# Patient Record
Sex: Male | Born: 1960 | Race: White | Hispanic: No | Marital: Married | Smoking: Current every day smoker
Health system: Southern US, Community
[De-identification: ages and names within clinical notes are randomized; demographics above are authoritative.]

## PROBLEM LIST (undated history)

## (undated) DIAGNOSIS — I10 Essential (primary) hypertension: Secondary | ICD-10-CM

## (undated) DIAGNOSIS — I2510 Atherosclerotic heart disease of native coronary artery without angina pectoris: Secondary | ICD-10-CM

## (undated) DIAGNOSIS — Z8673 Personal history of transient ischemic attack (TIA), and cerebral infarction without residual deficits: Secondary | ICD-10-CM

## (undated) DIAGNOSIS — Z720 Tobacco use: Secondary | ICD-10-CM

## (undated) DIAGNOSIS — Z8719 Personal history of other diseases of the digestive system: Secondary | ICD-10-CM

## (undated) DIAGNOSIS — G629 Polyneuropathy, unspecified: Secondary | ICD-10-CM

## (undated) DIAGNOSIS — K219 Gastro-esophageal reflux disease without esophagitis: Secondary | ICD-10-CM

## (undated) DIAGNOSIS — E119 Type 2 diabetes mellitus without complications: Secondary | ICD-10-CM

## (undated) DIAGNOSIS — C679 Malignant neoplasm of bladder, unspecified: Secondary | ICD-10-CM

## (undated) DIAGNOSIS — I639 Cerebral infarction, unspecified: Secondary | ICD-10-CM

## (undated) DIAGNOSIS — E785 Hyperlipidemia, unspecified: Secondary | ICD-10-CM

## (undated) DIAGNOSIS — M1990 Unspecified osteoarthritis, unspecified site: Secondary | ICD-10-CM

## (undated) DIAGNOSIS — H539 Unspecified visual disturbance: Secondary | ICD-10-CM

## (undated) DIAGNOSIS — I219 Acute myocardial infarction, unspecified: Secondary | ICD-10-CM

## (undated) HISTORY — DX: Cerebral infarction, unspecified: I63.9

## (undated) HISTORY — DX: Type 2 diabetes mellitus without complications: E11.9

## (undated) HISTORY — PX: BLADDER TUMOR EXCISION: SHX238

## (undated) HISTORY — DX: Essential (primary) hypertension: I10

## (undated) HISTORY — DX: Gastro-esophageal reflux disease without esophagitis: K21.9

## (undated) HISTORY — DX: Hyperlipidemia, unspecified: E78.5

## (undated) HISTORY — PX: CORONARY ANGIOPLASTY WITH STENT PLACEMENT: SHX49

## (undated) HISTORY — DX: Acute myocardial infarction, unspecified: I21.9

## (undated) HISTORY — PX: CAROTID ENDARTERECTOMY: SUR193

## (undated) HISTORY — PX: HERNIA REPAIR: SHX51

## (undated) HISTORY — DX: Personal history of transient ischemic attack (TIA), and cerebral infarction without residual deficits: Z86.73

## (undated) HISTORY — DX: Unspecified osteoarthritis, unspecified site: M19.90

---
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
## 2002-01-02 ENCOUNTER — Emergency Department (HOSPITAL_COMMUNITY): Admission: EM | Admit: 2002-01-02 | Discharge: 2002-01-02 | Payer: Self-pay | Admitting: Emergency Medicine

## 2010-05-23 ENCOUNTER — Emergency Department (HOSPITAL_COMMUNITY): Admission: EM | Admit: 2010-05-23 | Payer: Self-pay | Source: Home / Self Care

## 2018-08-05 ENCOUNTER — Telehealth: Payer: Self-pay

## 2018-08-05 NOTE — Telephone Encounter (Signed)
Copied from [place] [id number]. Topic: Appointment Scheduling - Scheduling Inquiry for Clinic >> Aug 05, 2018  9:16 AM Erick Blinks wrote: Reason for CRM: Pt's sister Everlean Patterson director of [facility] is requesting for pt to be seen with Dr. Charlett Blake to establish care. Pt has some concerns that current PCP is not addressing all needs. Please advise  Best contact: [phone] work cell (angela white)    Please advise

## 2018-08-05 NOTE — Telephone Encounter (Signed)
Sure am happy to see him. Set up virtual or in person

## 2018-08-05 NOTE — Telephone Encounter (Signed)
See previous note

## 2018-08-16 ENCOUNTER — Encounter: Payer: Self-pay | Admitting: Family Medicine

## 2018-08-16 ENCOUNTER — Ambulatory Visit (INDEPENDENT_AMBULATORY_CARE_PROVIDER_SITE_OTHER): Payer: Medicare Other | Admitting: Family Medicine

## 2018-08-16 ENCOUNTER — Other Ambulatory Visit: Payer: Self-pay

## 2018-08-16 VITALS — BP 180/104 | Ht 63.0 in | Wt 150.0 lb

## 2018-08-16 DIAGNOSIS — K219 Gastro-esophageal reflux disease without esophagitis: Secondary | ICD-10-CM

## 2018-08-16 DIAGNOSIS — E119 Type 2 diabetes mellitus without complications: Secondary | ICD-10-CM | POA: Diagnosis not present

## 2018-08-16 DIAGNOSIS — R079 Chest pain, unspecified: Secondary | ICD-10-CM

## 2018-08-16 DIAGNOSIS — H539 Unspecified visual disturbance: Secondary | ICD-10-CM

## 2018-08-16 DIAGNOSIS — H43391 Other vitreous opacities, right eye: Secondary | ICD-10-CM | POA: Diagnosis not present

## 2018-08-16 DIAGNOSIS — C679 Malignant neoplasm of bladder, unspecified: Secondary | ICD-10-CM

## 2018-08-16 DIAGNOSIS — G8929 Other chronic pain: Secondary | ICD-10-CM

## 2018-08-16 DIAGNOSIS — E782 Mixed hyperlipidemia: Secondary | ICD-10-CM | POA: Insufficient documentation

## 2018-08-16 DIAGNOSIS — Q898 Other specified congenital malformations: Secondary | ICD-10-CM

## 2018-08-16 DIAGNOSIS — Z720 Tobacco use: Secondary | ICD-10-CM

## 2018-08-16 DIAGNOSIS — I10 Essential (primary) hypertension: Secondary | ICD-10-CM | POA: Insufficient documentation

## 2018-08-16 DIAGNOSIS — M545 Low back pain, unspecified: Secondary | ICD-10-CM

## 2018-08-16 DIAGNOSIS — I2510 Atherosclerotic heart disease of native coronary artery without angina pectoris: Secondary | ICD-10-CM

## 2018-08-16 DIAGNOSIS — R569 Unspecified convulsions: Secondary | ICD-10-CM

## 2018-08-16 DIAGNOSIS — Q8989 Other specified congenital malformations: Secondary | ICD-10-CM

## 2018-08-16 DIAGNOSIS — E785 Hyperlipidemia, unspecified: Secondary | ICD-10-CM | POA: Diagnosis not present

## 2018-08-16 DIAGNOSIS — G6289 Other specified polyneuropathies: Secondary | ICD-10-CM

## 2018-08-16 DIAGNOSIS — Z8673 Personal history of transient ischemic attack (TIA), and cerebral infarction without residual deficits: Secondary | ICD-10-CM

## 2018-08-16 DIAGNOSIS — Z8719 Personal history of other diseases of the digestive system: Secondary | ICD-10-CM

## 2018-08-16 DIAGNOSIS — I159 Secondary hypertension, unspecified: Secondary | ICD-10-CM

## 2018-08-16 MED ORDER — GABAPENTIN 100 MG PO CAPS: 100.0000 mg | ORAL_CAPSULE | Freq: Every day | ORAL | 3 refills | Status: DC

## 2018-08-16 MED ORDER — ASPIRIN EC 81 MG PO TBEC: 81.0000 mg | DELAYED_RELEASE_TABLET | Freq: Every day | ORAL | Status: AC

## 2018-08-16 MED ORDER — NITROGLYCERIN 0.4 MG SL SUBL: 0.4000 mg | SUBLINGUAL_TABLET | SUBLINGUAL | 3 refills | Status: DC | PRN

## 2018-08-16 MED ORDER — CARVEDILOL 3.125 MG PO TABS: 3.1250 mg | ORAL_TABLET | Freq: Two times a day (BID) | ORAL | 3 refills | Status: DC

## 2018-08-16 NOTE — Patient Instructions (Signed)
[facility] blood pressure  Preventive Care 38-57 Years Old, Male Preventive care refers to lifestyle choices and visits with your health care provider that can promote health and wellness. This includes:  A yearly physical exam. This is also called an annual well check.  Regular dental and eye exams.  Immunizations.  Screening for certain conditions.  Healthy lifestyle choices, such as eating a healthy diet, getting regular exercise, not using drugs or products that contain nicotine and tobacco, and limiting alcohol use. What can I expect for my preventive care visit? Physical exam Your health care provider will check:  Height and weight. These may be used to calculate body mass index (BMI), which is a measurement that tells if you are at a healthy weight.  Heart rate and blood pressure.  Your skin for abnormal spots. Counseling Your health care provider may ask you questions about:  Alcohol, tobacco, and drug use.  Emotional well-being.  Home and relationship well-being.  Sexual activity.  Eating habits.  Work and work Statistician. What immunizations do I need?  Influenza (flu) vaccine  This is recommended every year. Tetanus, diphtheria, and pertussis (Tdap) vaccine  You may need a Td booster every 10 years. Varicella (chickenpox) vaccine  You may need this vaccine if you have not already been vaccinated. Zoster (shingles) vaccine  You may need this after age 80. Measles, mumps, and rubella (MMR) vaccine  You may need at least one dose of MMR if you were born in 1957 or later. You may also need a second dose. Pneumococcal conjugate (PCV13) vaccine  You may need this if you have certain conditions and were not previously vaccinated. Pneumococcal polysaccharide (PPSV23) vaccine  You may need one or two doses if you smoke cigarettes or if you have certain conditions. Meningococcal conjugate (MenACWY) vaccine  You may need this if you have certain conditions.  Hepatitis A vaccine  You may need this if you have certain conditions or if you travel or work in places where you may be exposed to hepatitis A. Hepatitis B vaccine  You may need this if you have certain conditions or if you travel or work in places where you may be exposed to hepatitis B. Haemophilus influenzae type b (Hib) vaccine  You may need this if you have certain risk factors. Human papillomavirus (HPV) vaccine  If recommended by your health care provider, you may need three doses over 6 months. You may receive vaccines as individual doses or as more than one vaccine together in one shot (combination vaccines). Talk with your health care provider about the risks and benefits of combination vaccines. What tests do I need? Blood tests  Lipid and cholesterol levels. These may be checked every 5 years, or more frequently if you are over 88 years old.  Hepatitis C test.  Hepatitis B test. Screening  Lung cancer screening. You may have this screening every year starting at age 65 if you have a 30-pack-year history of smoking and currently smoke or have quit within the past 15 years.  Prostate cancer screening. Recommendations will vary depending on your family history and other risks.  Colorectal cancer screening. All adults should have this screening starting at age 68 and continuing until age 59. Your health care provider may recommend screening at age 27 if you are at increased risk. You will have tests every 1-10 years, depending on your results and the type of screening test.  Diabetes screening. This is done by checking your blood sugar (glucose) after  you have not eaten for a while (fasting). You may have this done every 1-3 years.  Sexually transmitted disease (STD) testing. Follow these instructions at home: Eating and drinking  Eat a diet that includes fresh fruits and vegetables, whole grains, lean protein, and low-fat dairy products.  Take vitamin and mineral  supplements as recommended by your health care provider.  Do not drink alcohol if your health care provider tells you not to drink.  If you drink alcohol: ? Limit how much you have to 0-2 drinks a day. ? Be aware of how much alcohol is in your drink. In the [place] one drink equals one 12 oz bottle of beer (355 mL), one 5 oz glass of wine (148 mL), or one 1 oz glass of hard liquor (44 mL). Lifestyle  Take daily care of your teeth and gums.  Stay active. Exercise for at least 30 minutes on 5 or more days each week.  Do not use any products that contain nicotine or tobacco, such as cigarettes, e-cigarettes, and chewing tobacco. If you need help quitting, ask your health care provider.  If you are sexually active, practice safe sex. Use a condom or other form of protection to prevent STIs (sexually transmitted infections).  Talk with your health care provider about taking a low-dose aspirin every day starting at age 3. What's next?  Go to your health care provider once a year for a well check visit.  Ask your health care provider how often you should have your eyes and teeth checked.  Stay up to date on all vaccines. This information is not intended to replace advice given to you by your health care provider. Make sure you discuss any questions you have with your health care provider. Document Released: 01/18/2015 Document Revised: 12/16/2017 Document Reviewed: 12/16/2017 Elsevier Patient Education  2020 [employer].

## 2018-08-16 NOTE — Progress Notes (Signed)
-  Both feet numb and hurt.....going on about 6 months -Blood pressure concerns  -Skin from hips down, hurts for him to touch his legs  -Blood sugar out of control

## 2018-08-17 ENCOUNTER — Encounter: Payer: Self-pay | Admitting: Family Medicine

## 2018-08-17 DIAGNOSIS — Z720 Tobacco use: Secondary | ICD-10-CM

## 2018-08-17 DIAGNOSIS — H539 Unspecified visual disturbance: Secondary | ICD-10-CM

## 2018-08-17 DIAGNOSIS — Q898 Other specified congenital malformations: Secondary | ICD-10-CM | POA: Insufficient documentation

## 2018-08-17 DIAGNOSIS — I25119 Atherosclerotic heart disease of native coronary artery with unspecified angina pectoris: Secondary | ICD-10-CM | POA: Insufficient documentation

## 2018-08-17 DIAGNOSIS — G629 Polyneuropathy, unspecified: Secondary | ICD-10-CM

## 2018-08-17 DIAGNOSIS — C679 Malignant neoplasm of bladder, unspecified: Secondary | ICD-10-CM | POA: Insufficient documentation

## 2018-08-17 DIAGNOSIS — I2510 Atherosclerotic heart disease of native coronary artery without angina pectoris: Secondary | ICD-10-CM

## 2018-08-17 DIAGNOSIS — M549 Dorsalgia, unspecified: Secondary | ICD-10-CM | POA: Insufficient documentation

## 2018-08-17 DIAGNOSIS — R569 Unspecified convulsions: Secondary | ICD-10-CM | POA: Insufficient documentation

## 2018-08-17 DIAGNOSIS — R079 Chest pain, unspecified: Secondary | ICD-10-CM | POA: Insufficient documentation

## 2018-08-17 DIAGNOSIS — Z8719 Personal history of other diseases of the digestive system: Secondary | ICD-10-CM

## 2018-08-17 HISTORY — DX: Malignant neoplasm of bladder, unspecified: C67.9

## 2018-08-17 HISTORY — DX: Tobacco use: Z72.0

## 2018-08-17 HISTORY — DX: Unspecified visual disturbance: H53.9

## 2018-08-17 HISTORY — DX: Polyneuropathy, unspecified: G62.9

## 2018-08-17 HISTORY — DX: Personal history of other diseases of the digestive system: Z87.19

## 2018-08-17 HISTORY — DX: Atherosclerotic heart disease of native coronary artery without angina pectoris: I25.10

## 2018-08-17 NOTE — Assessment & Plan Note (Signed)
Encouraged heart healthy diet, increase exercise, avoid trans fats, consider a krill oil cap daily 

## 2018-08-17 NOTE — Assessment & Plan Note (Signed)
Burning and numbness present in both feet/legs for years but patient getting more uncomfortable. Will start Gabapentin 100 mg qhs and increase to 300 mg qhs. Reassess at next visit.

## 2018-08-17 NOTE — Assessment & Plan Note (Signed)
At the end of an hour long visit he mentions he has been having chest pain intermittently for a couple of weeks. The pain is substernal and sharp, radiates to his jaw ans lasts 5-10 minutes. Given his history and risk factor he is referred to cardiology for further evaluation. He is to start aspirin 81 mg daily and is given SL NTG 0.4 mg to use as directed if CP occurs. If no resolution after 3 doses to ER.

## 2018-08-17 NOTE — Assessment & Plan Note (Signed)
No seizure since 58 years of age.

## 2018-08-17 NOTE — Assessment & Plan Note (Signed)
Avoid offending foods, start probiotics. Do not eat large meals in late evening and consider raising head of bed. Well controlled on Nexium 20 mg bid

## 2018-08-17 NOTE — Assessment & Plan Note (Signed)
Will check hgba1c. minimize simple carbs. Increase exercise as tolerated. Continue current meds which he has only recently restarted

## 2018-08-17 NOTE — Assessment & Plan Note (Signed)
Encouraged complete cessation. Discussed need to quit as relates to risk of numerous cancers, cardiac and pulmonary disease as well as neurologic complications. Counseled for greater than 3 minutes 

## 2018-08-17 NOTE — Assessment & Plan Note (Signed)
H/o thrombosed hemorrhoids requiring surgical correction. He is hesitant to accept referral to colonoscopy for this reason. He is worried it would cause a resurgence of hemorrhoids.

## 2018-08-17 NOTE — Assessment & Plan Note (Signed)
Has not been left with deficits.

## 2018-08-17 NOTE — Assessment & Plan Note (Signed)
Encouraged moist heat and gentle stretching as tolerated. May try NSAIDs and prescription meds as directed and report if symptoms worsen or seek immediate care 

## 2018-08-17 NOTE — Assessment & Plan Note (Signed)
He is reporting flashes of light in his left eye intermittently. Is referred to opthamologyf for further evaluation

## 2018-08-17 NOTE — Assessment & Plan Note (Signed)
He has had labile bp recently. Has stopped th LIsinopril given to him by his previous PMD. Will wait to restart until we see lab work. Start Carvedilol 3.125 mg bid. Check vitals daily and report any concerns.

## 2018-08-17 NOTE — Progress Notes (Signed)
Virtual Visit via Video Note  I connected with Dean Singh on 08/16/2018 at  3:20 PM EDT by a video enabled telemedicine application and verified that I am speaking with the correct person using two identifiers.  Location: Patient: home Provider: office   I discussed the limitations of evaluation and management by telemedicine and the availability of in person appointments. The patient expressed understanding and agreed to proceed. Magdalene Molly, CMA was able to get the patient set up on a visit, video   Subjective:    Patient ID: Dean Singh, male    DOB: [birth year], 58 y.o.   MRN: [id number]  No chief complaint on file.   HPI Patient is in today for new patient appointment with a very complicated past medical history that includes coronary artery disease with 2 stents in place, stroke without lasting deficits, peripheral neuropathy bilaterally, tobacco abuse, h/o alcohol abuse in his teens but abstinence since, bladder cancer, hyperlipidemia, uncontrolled diabetes, reflux and more. He is accompanied by his wife and sister in his home. He feels at his baseline today and fairly well but he endorses many concerns. He reports he chose not to go back to his previous PMD many months ago because he has significant pain daily secondary to peripheral neuropathy in both legs. He reports he has had the pain for years but it has been worsening. He also notes chronic trouble with low back pain and DDD. No recent fall or injury. He struggles with diabetes and had stopped his Metformin after getting frustrated with his PMD and only restarted it a couple of weeks ago. No c/o polyuria or polydipsia. His heartburn is well controlled on Nexium 20 mg bid. He was on Lisinopril but stopped that as well since he stopped seing his PMD. As a child he had seizures but none since 45 years of age. He reports a h/o a stroke but he has not been left with any deficits. He is endorsing episodes of seeing flashes of  bright light in his left eye which comes and goes. No visual changes otherwise and no trauma. He also notes several episodes of chest pain in the past couple of weeks. The episodes happen in no apparent pattern but do not awaken him from sleep. They are sharp and substernal and last less than 10 minutes. The pain can radiate to the jaw and left arm. He experiences palpitations at times but they are not associated with the chest pain. No recent febrile illness or hospitalizations. He has not sought any care for his symptoms to date. Denies SOB/HA/congestion/fevers/GI or GU c/o. Taking meds as prescribed  Past Medical History:  Diagnosis Date   Arthritis    DDD   Bladder cancer ([place])    Bladder cancer () 08/17/2018   CAD in native artery 08/17/2018   Current nicotine use 08/17/2018   Diabetes mellitus without complication (HCC)    GERD (gastroesophageal reflux disease)    H/O: stroke    Heart attack ([place])    2 stents   Hx of hemorrhoids 08/17/2018   Hyperlipidemia    Hypertension    Peripheral neuropathy 08/17/2018   Stroke ([place]) 57   Visual changes 08/17/2018    Family History  Problem Relation Age of Onset   COPD Mother    Diabetes Mother    Dementia Mother    Heart disease Mother        chf   Arthritis Mother    Osteoporosis Mother  h/o cigarette smoking   Heart disease Father        stents 3 x 2, pacer, chf   Parkinson's disease Father    Hyperlipidemia Father    Diabetes Father    Arthritis Father        DDD   Osteoporosis Father        h/o cigarette use   Addison's disease Sister    Diabetes Sister    Hyperlipidemia Sister    Hypertension Sister    Obesity Sister    Hyperlipidemia Brother    Hypertension Brother    Diabetes Brother    Obesity Brother    Rheumatic fever Son    Stickler syndrome Son    Diabetes Maternal Grandmother    Dementia Maternal Grandmother    Heart disease Maternal Grandmother        chf    Hyperlipidemia Maternal Grandmother    Diabetes Maternal Grandfather    Dementia Maternal Grandfather    Heart disease Maternal Grandfather        cad   Hyperlipidemia Maternal Grandfather    Other Paternal Grandmother        scarlet fever   Prostate cancer Paternal Grandfather    Obesity Brother     Social History   Socioeconomic History   Marital status: Married    Spouse name: Not on file   Number of children: Not on file   Years of education: Not on file   Highest education level: Not on file  Occupational History   Not on file  Social Needs   Financial resource strain: Not on file   Food insecurity    Worry: Not on file    Inability: Not on file   Transportation needs    Medical: Not on file    Non-medical: Not on file  Tobacco Use   Smoking status: Current Every Day Smoker   Smokeless tobacco: Current User  Substance and Sexual Activity   Alcohol use: Not Currently   Drug use: Not Currently   Sexual activity: Yes  Lifestyle   Physical activity    Days per week: Not on file    Minutes per session: Not on file   Stress: Not on file  Relationships   Social connections    Talks on phone: Not on file    Gets together: Not on file    Attends religious service: Not on file    Active member of club or organization: Not on file    Attends meetings of clubs or organizations: Not on file    Relationship status: Not on file   Intimate partner violence    Fear of current or ex partner: Not on file    Emotionally abused: Not on file    Physically abused: Not on file    Forced sexual activity: Not on file  Other Topics Concern   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     glucose blood (ONETOUCH ULTRA) test strip USE TWICE DAILY AS DIRECTED ( E11.65)  metFORMIN (GLUCOPHAGE) 1000 MG tablet Take by mouth.     esomeprazole (NEXIUM) 40 MG capsule Take by mouth.     glucose blood (PRECISION QID TEST) test strip Use twice daily as directed     metFORMIN (GLUCOPHAGE) 500 MG tablet Take by mouth.     No facility-administered medications prior to visit.     Allergies  Allergen Reactions   Lipitor [Atorvastatin Calcium] Itching    Patient reports "Made my skin feel horrible and unbearable"   Statins Itching   Morphine Nausea And Vomiting    "felt like chest was exploding" pt reports "felt like chest was exploding" pt reports "felt like chest was exploding" pt reports "felt like chest was exploding" pt reports     Review of Systems  Constitutional: Positive for malaise/fatigue. Negative for chills and fever.  HENT: Negative for congestion and hearing loss.   Eyes: Positive for blurred vision. Negative for double vision, photophobia, pain, discharge and redness.  Respiratory: Positive for shortness of breath. Negative for cough and sputum production.   Cardiovascular: Positive for chest pain. Negative for palpitations and leg swelling.  Gastrointestinal: Positive for heartburn. Negative for abdominal pain, blood in stool, constipation, diarrhea, nausea and vomiting.  Genitourinary: Negative for dysuria, frequency, hematuria and urgency.  Musculoskeletal: Negative for back pain, falls and myalgias.  Skin: Negative for rash.  Neurological: Positive for tingling and sensory change. Negative for dizziness, loss of consciousness, weakness and headaches.  Endo/Heme/Allergies: Negative for environmental allergies. Does not bruise/bleed easily.  Psychiatric/Behavioral: Negative for depression and suicidal ideas.  The patient is not nervous/anxious and does not have insomnia.        Objective:    Physical Exam Constitutional:      Appearance: Normal appearance. He is not ill-appearing.  HENT:     Head: Normocephalic and atraumatic.     Right Ear: External ear normal.     Left Ear: External ear normal.  Eyes:     General:        Right eye: No discharge.        Left eye: No discharge.  Pulmonary:     Effort: Pulmonary effort is normal.  Neurological:     Mental Status: He is alert and oriented to person, place, and time.  Psychiatric:        Mood and Affect: Mood normal.        Behavior: Behavior normal.     BP (!) 180/104 (BP Location: Left Arm, Patient Position: Sitting, Cuff Size: Normal)    Ht '5\' 3"'  (1.6 m)    Wt 150 lb (68 kg)    BMI 26.57 kg/m  Wt Readings from Last 3 Encounters:  08/16/18 150 lb (68 kg)    Diabetic Foot Exam - Simple   No data filed     No results found for: WBC, HGB, HCT, PLT, GLUCOSE, CHOL, TRIG, HDL, LDLDIRECT, LDLCALC, ALT, AST, NA, K, CL, CREATININE, BUN, CO2, TSH, PSA, INR, GLUF, HGBA1C, MICROALBUR  No results found for: TSH No results found for: WBC, HGB, HCT, MCV, PLT No results found for: NA, K, CHLORIDE, CO2, GLUCOSE, BUN, CREATININE, BILITOT, ALKPHOS, AST, ALT, PROT, ALBUMIN, CALCIUM, ANIONGAP, EGFR, GFR No results found for: CHOL No results found for: HDL No results found for: LDLCALC No results found for: TRIG No results found for: CHOLHDL No results found for: HGBA1C     Assessment & Plan:   Problem List Items Addressed This Visit    Hyperlipidemia    Encouraged  heart healthy diet, increase exercise, avoid trans fats, consider a krill oil cap daily      Relevant Medications   carvedilol (COREG) 3.125 MG tablet   nitroGLYCERIN (NITROSTAT) 0.4 MG SL tablet   aspirin EC 81 MG tablet   Diabetes mellitus without complication (HCC)    Will check hgba1c. minimize simple carbs. Increase exercise as tolerated. Continue current meds which  he has only recently restarted      Relevant Medications   glipiZIDE (GLUCOTROL XL) 5 MG 24 hr tablet   metFORMIN (GLUCOPHAGE) 1000 MG tablet   metFORMIN (GLUCOPHAGE) 500 MG tablet   aspirin EC 81 MG tablet   GERD (gastroesophageal reflux disease)    Avoid offending foods, start probiotics. Do not eat large meals in late evening and consider raising head of bed. Well controlled on Nexium 20 mg bid      Relevant Medications   esomeprazole (NEXIUM) 40 MG capsule   H/O: stroke    Has not been left with deficits.      Hypertension    He has had labile bp recently. Has stopped th LIsinopril given to him by his previous PMD. Will wait to restart until we see lab work. Start Carvedilol 3.125 mg bid. Check vitals daily and report any concerns.       Relevant Medications   carvedilol (COREG) 3.125 MG tablet   nitroGLYCERIN (NITROSTAT) 0.4 MG SL tablet   aspirin EC 81 MG tablet   Chest pain    At the end of an hour long visit he mentions he has been having chest pain intermittently for a couple of weeks. The pain is substernal and sharp, radiates to his jaw ans lasts 5-10 minutes. Given his history and risk factor he is referred to cardiology for further evaluation. He is to start aspirin 81 mg daily and is given SL NTG 0.4 mg to use as directed if CP occurs. If no resolution after 3 doses to ER.       Relevant Orders   Ambulatory referral to Cardiology   Bladder cancer [facility])   Relevant Medications   aspirin EC 81 MG tablet   Visual changes    He is reporting flashes of light in his left eye intermittently. Is referred to opthamologyf for further evaluation      Hx of hemorrhoids    H/o thrombosed hemorrhoids requiring surgical correction. He is hesitant to accept referral to colonoscopy for this reason. He is worried it would cause a resurgence of hemorrhoids.       Current nicotine use    Encouraged complete cessation. Discussed need to quit as relates to risk of numerous cancers,  cardiac and pulmonary disease as well as neurologic complications. 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       Relevant Medications   gabapentin (NEURONTIN) 100 MG capsule   CAD in native artery   RESOLVED: Stickler syndrome   Relevant Medications   aspirin EC 81 MG tablet   Peripheral neuropathy    Burning and numbness present in both feet/legs for years but patient getting more uncomfortable. Will start Gabapentin 100 mg qhs and increase to 300 mg qhs. Reassess at next visit.       Relevant Medications   gabapentin (NEURONTIN) 100 MG capsule   Back pain    Encouraged moist heat and gentle stretching as tolerated. May try NSAIDs and prescription  meds as directed and report if symptoms worsen or seek immediate care      Relevant Medications   aspirin EC 81 MG tablet    Other Visit Diagnoses    Floaters, right    -  Primary   Relevant Orders   Ambulatory referral to Ophthalmology      I am having Dean Singh start on gabapentin, carvedilol, nitroGLYCERIN, and aspirin EC. I am also having him maintain his esomeprazole, glipiZIDE, metFORMIN, metFORMIN, OneTouch Ultra, FreeStyle Libre 14 Day Sensor, and Precision QID Test.  Meds ordered this encounter  Medications   gabapentin (NEURONTIN) 100 MG capsule    Sig: Take 1-3 capsules (100-300 mg total) by mouth at bedtime.    Dispense:  90 capsule    Refill:  3   carvedilol (COREG) 3.125 MG tablet    Sig: Take 1 tablet (3.125 mg total) by mouth 2 (two) times daily with a meal.    Dispense:  60 tablet    Refill:  3   nitroGLYCERIN (NITROSTAT) 0.4 MG SL tablet    Sig: Place 1 tablet (0.4 mg total) under the tongue every 5 (five) minutes as needed for chest pain.    Dispense:  50 tablet    Refill:  3   aspirin EC 81 MG tablet    Sig: Take 1 tablet (81 mg total) by mouth daily.    Dispense:     I discussed the assessment and treatment plan with the  patient. The patient was provided an opportunity to ask questions and all were answered. The patient agreed with the plan and demonstrated an understanding of the instructions.   The patient was advised to call back or seek an in-person evaluation if the symptoms worsen or if the condition fails to improve as anticipated.  I provided 80 minutes of non-face-to-face time during this encounter.   Penni Homans, MD

## 2018-08-19 ENCOUNTER — Encounter: Payer: Self-pay | Admitting: *Deleted

## 2018-08-22 NOTE — H&P (View-Only) (Signed)
Cardiology Office Note:    Date:  08/23/2018   ID:  Dean Singh, Dean Singh Jan 20, 1960, MRN [id number]  PCP:  Dean Lukes, MD  Cardiologist:  Dean Dresser, MD PhD  Referring MD: Dean Lukes, MD   CC: establish care, evaluation for chest pain  History of Present Illness:    Dean Singh is a 58 y.o. male with a hx of CAD with 2 prior stents, CVA, tobacco abuse, hyperlipidemia, uncontrolled type II diabetes who is seen as a new consult at the request of Dean Lukes, MD for the evaluation and management of chest pain. Pertinent non-CV medical history includes bladder cancer, severe peripheral neuropathy, DDD, GERD.  He is accompanied today by his sister Dean Singh.  Patient concerns today: chest pain.  Chest pain: -Initial onset: has had no pain since 2015 until about 1 month ago. -Quality: severe pain: goes to throat, bottom of tongue, left arm. Sharp, severe pain may not be in his chest but only throat/arm. Does have a separate central sharp, burning epigastric pain that is more moderate in intensity. No radiation. Same as his prior MI pain.   -Frequency: every other week for the severe pain, every other day for the sharp/burning epigastric pain -Duration: 20-30 seconds, none longer -Associated symptoms: sometimes palpitations. No SOB/nausea/diaphoresis, no syncope. -Aggravating/alleviating factors: Better with rest. Not related to food. Not clearly exertional, no clear triggers.  -Prior cardiac history: 09/2013 [place] two [place], was on prasugrel and aspirin. Not sure of doctor who did stent, possibly [place]. Also has history of carotid endarterectomy. -Prior ECG:  Here today NSR -Prior workup: no stress test or cath since stents placed. Was seeing cardiologist at [facility], cannot remember name (not in care everywhere records). [facility]. -Prior treatment: can't take statin, breaks out in hives across  his entire body--he thinks atorvastatin was a problem for sure, unclear if he has tried other options. Also had joint swelling on statins -Alcohol: none ever -Tobacco: current smoker, at least 1 ppd. Smoking since age 87, longest time quit was for 2.5 years after he was married. Has no desire to quit currently. Discussed quitting today. Wife smokes as well.  -Comorbidities: diabetes, many years. Not on insulin.  -Exercise level: works every day, can't sit still. 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 Mows his own lawn (1.8 acres). No pain with mowing the lawn.  -Cardiac ROS: no shortness of breath, no PND, no orthopnea, no LE edema, no syncope -Family history: father has had multiple stents in late 50s/early 53s, still living in his 46s. Also has pacemaker. Mother has CHF, mat gpa had CAD, mat gma had HTN, pat gpa prostate cancer (heart was ok)--died in early 25s, pat gma died young of rheumatic fever  Also has palpitations. Feel like bubbles/butterflies. 1-2x/day, can be brief to 30 seconds. Makes him sick to his stomach. Also going on the last month, worse in the last week.   Denies shortness of breath at rest or with normal exertion. No PND, orthopnea, or unexpected weight gain. Has lost 27 lbs unintentionally in last 3-4 months. Eats well, great appetite. Has been told he has irritable bowel, no testing. End of day has mild LE edema.  Past Medical History:  Diagnosis Date  . Arthritis    DDD  . Bladder cancer ([place])   . Bladder cancer ([place]) 08/17/2018  . CAD in native artery 08/17/2018  . Current nicotine use 08/17/2018  .  Diabetes mellitus without complication ([place])   . GERD (gastroesophageal reflux disease)   . H/O: stroke   . Heart attack ([place])    2 stents  . Hx of hemorrhoids 08/17/2018  . Hyperlipidemia   . Hypertension   . Peripheral neuropathy 08/17/2018  . Stroke ([place]) 57  . Visual changes 08/17/2018    Past Surgical History:  Procedure Laterality Date  . BLADDER TUMOR EXCISION      cancer  . CAROTID ENDARTERECTOMY Left   . CORONARY ANGIOPLASTY WITH STENT PLACEMENT     2 stent  . HERNIA REPAIR     right inguinal 2017    Current Medications: Current Outpatient Medications on File Prior to Visit  Medication Sig  . aspirin EC 81 MG tablet Take 1 tablet (81 mg total) by mouth daily.  . carvedilol (COREG) 3.125 MG tablet Take 1 tablet (3.125 mg total) by mouth 2 (two) times daily with a meal.  . Continuous Blood Gluc Sensor (FREESTYLE LIBRE 14 DAY SENSOR) MISC Use every 14 days to check sugars daily  . esomeprazole (NEXIUM) 40 MG capsule Take by mouth.  . gabapentin (NEURONTIN) 100 MG capsule Take 1-3 capsules (100-300 mg total) by mouth at bedtime.  [name] glipiZIDE (GLUCOTROL XL) 5 MG 24 hr tablet Take 1 tablet by mouth daily.  [name] glucose blood (ONETOUCH ULTRA) test strip USE TWICE DAILY AS DIRECTED ( E11.65)  . glucose blood (PRECISION QID TEST) test strip Use twice daily as directed  . metFORMIN (GLUCOPHAGE) 1000 MG tablet Take by mouth.  . nitroGLYCERIN (NITROSTAT) 0.4 MG SL tablet Place 1 tablet (0.4 mg total) under the tongue every 5 (five) minutes as needed for chest pain.   No current facility-administered medications on file prior to visit.      Allergies:   Lipitor [atorvastatin calcium], Statins, and Morphine   Social History   Socioeconomic History  . Marital status: Married    Spouse name: Not on file  . Number of children: Not on file  . Years of education: Not on file  . Highest education level: Not on file  Occupational History  . Not on file  Social Needs  . Financial resource strain: Not on file  . Food insecurity    Worry: Not on file    Inability: Not on file  . Transportation needs    Medical: Not on file    Non-medical: Not on file  Tobacco Use  . Smoking status: Current Every Day Smoker  . Smokeless tobacco: Current User  Substance and Sexual Activity  . Alcohol use: Not Currently  . Drug use: Not Currently  . Sexual activity:  Yes  Lifestyle  . Physical activity    Days per week: Not on file    Minutes per session: Not on file  . Stress: Not on file  Relationships  . Social Herbalist on phone: Not on file    Gets together: Not on file    Attends religious service: Not on file    Active member of club or organization: Not on file    Attends meetings of clubs or organizations: Not on file    Relationship status: Not on file  Other Topics Concern  . 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     Family History: The patient's family history includes  Addison's disease in his sister; Arthritis in his father and mother; COPD in his mother; Dementia in his maternal grandfather, maternal grandmother, and mother; Diabetes in his brother, father, maternal grandfather, maternal grandmother, mother, and sister; Heart disease in his father, maternal grandfather, maternal grandmother, and mother; Hyperlipidemia in his brother, father, maternal grandfather, maternal grandmother, and sister; Hypertension in his brother and sister; Obesity in his brother, brother, and sister; Osteoporosis in his father and mother; Other in his paternal grandmother; Parkinson's disease in his father; Prostate cancer in his paternal grandfather; Rheumatic fever in his son; Stickler syndrome in his son.  ROS:   Please see the history of present illness.  Additional pertinent ROS: Constitutional: Negative for chills, fever, night sweats, Positive for unintentional weight loss (27 lbs). HENT: Negative for ear pain and hearing loss.   Eyes: Negative for loss of vision and eye pain.  Respiratory: Negative for cough, sputum, wheezing.   Cardiovascular: See HPI. Gastrointestinal: Negative for abdominal pain, melena, and hematochezia.  Genitourinary: Negative for dysuria and hematuria.   Musculoskeletal: Negative for falls. Chronic neuropathy/myalgia. Skin: Negative for itching and rash.  Neurological: Negative for focal weakness, focal sensory changes and loss of consciousness.  Endo/Heme/Allergies: Does bruise/bleed easily.     EKGs/Labs/Other Studies Reviewed:    The following studies were reviewed today: No prior cardiac records available  EKG:  EKG is personally reviewed.  The ekg ordered today demonstrates NSR  Recent Labs: 08/23/2018: ALT 6; BUN 16; Creatinine, Ser 0.91; Hemoglobin 16.7; Platelets 290; Potassium 5.0; Sodium 135  Recent Lipid Panel    Component Value Date/Time   CHOL 218 (H) 08/23/2018 1148   TRIG 254 (H) 08/23/2018 1148   HDL 29 (L) 08/23/2018 1148   CHOLHDL 7.5 (H) 08/23/2018 1148   LDLCALC 138 (H) 08/23/2018 1148    Physical Exam:    VS:  BP 124/62   Pulse 77   Temp (!) 97.1 F (36.2 C)   Ht 5' (1.524 m)   Wt 150 lb 9.6 oz (68.3 kg)   SpO2 96%   BMI 29.41 kg/m     Wt Readings from Last 3 Encounters:  08/23/18 150 lb 9.6 oz (68.3 kg)  08/16/18 150 lb (68 kg)     GEN: Well nourished, well developed in no acute distress HEENT: Normal, moist mucous membranes NECK: No JVD CARDIAC: regular rhythm, normal S1 and S2, no murmurs, rubs, gallops.  VASCULAR: Radial and DP pulses 2+ bilaterally. No carotid bruits RESPIRATORY:  Clear to auscultation without rales, wheezing or rhonchi  ABDOMEN: Soft, non-tender, non-distended MUSCULOSKELETAL:  Ambulates independently SKIN: Warm and dry, no edema NEUROLOGIC:  Alert and oriented x 3. No focal neuro deficits noted. PSYCHIATRIC:  Normal affect    ASSESSMENT:    1. Chest pain, unspecified type   2. Diabetes mellitus without complication ([place])   3. Hyperlipidemia, unspecified hyperlipidemia type   4. Pre-procedure lab exam   5. Coronary artery disease involving native coronary artery of native heart with other form of angina pectoris ([place])   6. Tobacco abuse counseling   7. Carotid  artery disease, unspecified laterality, unspecified type ([place])    PLAN:    Chest pain, with history of CAD and prior stents: very concerning, as he reports it is the same as his prior MI pain. However, has atypical components, short lasting, intermittent. Not becoming more frequent. Reports that he had a normal stress test prior to getting his stents, so he has low confidence in noninvasive testing. Not a  good CT coronary candidate with prior stents.   We discussed multiple options for evaluation today. Shared decision making led to decision to pursue cath.   Risks and benefits of cardiac catheterization have been discussed with the patient.  These include bleeding, infection, kidney damage, stroke, heart attack, death.  The patient understands these risks and is willing to proceed.  -continue aspirin 81 mg -intolerant of statins, see below -does not endorse prior systolic dysfunction, but based on results of cath may need to pursue echo -on carvedilol 3.125 mg BID  Hypercholesterolemia: LDL goal <70. Checked lipids today, LDL 138, TG 254 (not fasting) -intolerant of atorvastatin at a minimum, had whole body hives and joint pain -will refer for PCSK9i therapy discussion  Tobacco abuse: The patient was counseled on tobacco cessation today for 5 minutes.  Counseling included reviewing the risks of smoking tobacco products, how it impacts the patient's current medical diagnoses and different strategies for quitting.  Pharmacotherapy to aid in tobacco cessation was not prescribed today.  Cardiac risk counseling and prevention recommendations: -recommend heart healthy/Mediterranean diet, with whole grains, fruits, vegetable, fish, lean meats, nuts, and olive oil. Limit salt. -recommend moderate walking, 3-5 times/week for 30-50 minutes each session. Aim for at least 150 minutes.week. Goal should be pace of 3 miles/hours, or walking 1.5 miles in 30 minutes -recommend avoidance of tobacco products.  Avoid excess alcohol. -Additional risk factor control:  -Diabetes: A1c is 9.3, poorly controlled on glipizide and metformin. Would benefit from SGLT2i or GLP1 RA given CAD history. Will defer to Dr. Charlett Blake.  -Lipids: as above  -Blood pressure control: at goal on carvedilol alone  -Weight: BMI 29  Plan for follow up: 2 weeks to discuss optimizing meds after cath  Medication Adjustments/Labs and Tests Ordered: Current medicines are reviewed at length with the patient today.  Concerns regarding medicines are outlined above.  Orders Placed This Encounter  Procedures  . Lipid panel  . Comprehensive metabolic panel  . Hemoglobin A1c  . CBC  . EKG 12-Lead   No orders of the defined types were placed in this encounter.   Patient Instructions  Medication Instructions:  Your Physician recommend you continue on your current medication as directed.    If you need a refill on your cardiac medications before your next appointment, please call your pharmacy.   Lab work: Your physician recommends that you return for lab work today (CBC, CMP, Lipid, A1C)  If you have labs (blood work) drawn today and your tests are completely normal, you will receive your results only by: [name] MyChart Message (if you have MyChart) OR . A paper copy in the mail If you have any lab test that is abnormal or we need to change your treatment, we will call you to review the results.  Testing/Procedures: Your physician has requested that you have a cardiac catheterization. Cardiac catheterization is used to diagnose and/or treat various heart conditions. Doctors may recommend this procedure for a number of different reasons. The most common reason is to evaluate chest pain. Chest pain can be a symptom of coronary artery disease (CAD), and cardiac catheterization can show whether plaque is narrowing or blocking your heart's arteries. This procedure is also used to evaluate the valves, as well as measure the blood flow and  oxygen levels in different parts of your heart. For further information please visit [email or web address][employer][email or web address]. Please follow instruction sheet, as given.  [facility]  COVID-19 test August, 21 @ 2:55 pm [address]  [place], [place] [address]   Follow-Up: At [facility], you and your health needs are our priority.  As part of our continuing mission to provide you with exceptional heart care, we have created designated Provider Care Teams.  These Care Teams include your primary Cardiologist (physician) and Advanced Practice Providers (APPs -  Physician Assistants and Nurse Practitioners) who all work together to provide you with the care you need, when you need it. You will need a follow up appointment in 2 weeks.  Please call our office 2 months in advance to schedule this appointment.  You may see Dr. Harrell Gave or one of the following Advanced Practice Providers on your designated Care Team:   Rosaria Ferries, PA-C . Jory Sims, DNP, ANP      [facility] CARDIOVASCULAR DIVISION [facility] [address] [place] [address] Dept: [phone] Loc: [place]. [place]  08/23/2018  You are scheduled for a Cardiac Catheterization on Tuesday, August 25 with Dr. Daneen Schick.  1. Please arrive at the [facility] (Main Entrance A) at [facility]: [address] [place], [place] [address] at 5:30 AM (This time is two hours before your procedure to ensure your preparation). Free valet parking service is available.   Special note: Every effort is made to have your procedure done on time. Please understand that emergencies sometimes delay scheduled procedures.  2. Diet: Do not eat solid foods after midnight.  The patient may have clear liquids until 5am upon the day of the procedure.  3. Labs: You will need to have blood drawn today (CMP, CBC, Lipid, A1C)  4. Medication instructions in preparation for your  procedure:   Contrast Allergy: No   Hold Glipizide and Metformin morning of procedure  Hold Metformin  48 hours after procedure  On the morning of your procedure, take your Aspirin and any morning medicines NOT listed above.  You may use sips of water.  5. Plan for one night stay--bring personal belongings. 6. Bring a current list of your medications and current insurance cards. 7. You MUST have a responsible person to drive you home. 8. Someone MUST be with you the first 24 hours after you arrive home or your discharge will be delayed. 9. Please wear clothes that are easy to get on and off and wear slip-on shoes.  Thank you for allowing [place] to care for you!   -- [facility]       Signed, Dean Dresser, MD PhD 08/23/2018 10:38 AM    [place]

## 2018-08-22 NOTE — Progress Notes (Signed)
Cardiology Office Note:    Date:  08/23/2018   ID:  Dean Singh, Dean Singh July 03, 1960, MRN [id number]  PCP:  Dean Lukes, MD  Cardiologist:  Dean Dresser, MD PhD  Referring MD: Dean Lukes, MD   CC: establish care, evaluation for chest pain  History of Present Illness:    Dean Singh is a 58 y.o. male with a hx of CAD with 2 prior stents, CVA, tobacco abuse, hyperlipidemia, uncontrolled type II diabetes who is seen as a new consult at the request of Dean Lukes, MD for the evaluation and management of chest pain. Pertinent non-CV medical history includes bladder cancer, severe peripheral neuropathy, DDD, GERD.  He is accompanied today by his sister Dean Singh.  Patient concerns today: chest pain.  Chest pain: -Initial onset: has had no pain since 2015 until about 1 month ago. -Quality: severe pain: goes to throat, bottom of tongue, left arm. Sharp, severe pain may not be in his chest but only throat/arm. Does have a separate central sharp, burning epigastric pain that is more moderate in intensity. No radiation. Same as his prior MI pain.   -Frequency: every other week for the severe pain, every other day for the sharp/burning epigastric pain -Duration: 20-30 seconds, none longer -Associated symptoms: sometimes palpitations. No SOB/nausea/diaphoresis, no syncope. -Aggravating/alleviating factors: Better with rest. Not related to food. Not clearly exertional, no clear triggers.  -Prior cardiac history: 09/2013 [place] two [place], was on prasugrel and aspirin. Not sure of doctor who did stent, possibly [place]. Also has history of carotid endarterectomy. -Prior ECG:  Here today NSR -Prior workup: no stress test or cath since stents placed. Was seeing cardiologist at [facility], cannot remember name (not in care everywhere records). He wishes to transfer his care to [facility]. -Prior treatment: can't take statin, breaks out in hives across  his entire body--he thinks atorvastatin was a problem for sure, unclear if he has tried other options. Also had joint swelling on statins -Alcohol: none ever -Tobacco: current smoker, at least 1 ppd. Smoking since age 60, longest time quit was for 2.5 years after he was married. Has no desire to quit currently. Discussed quitting today. Wife smokes as well.  -Comorbidities: diabetes, many years. Not on insulin.  -Exercise level: works every day, can't sit still. 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 Mows his own lawn (1.8 acres). No pain with mowing the lawn.  -Cardiac ROS: no shortness of breath, no PND, no orthopnea, no LE edema, no syncope -Family history: father has had multiple stents in late 50s/early 46s, still living in his 74s. Also has pacemaker. Mother has CHF, mat gpa had CAD, mat gma had HTN, pat gpa prostate cancer (heart was ok)--died in early 85s, pat gma died young of rheumatic fever  Also has palpitations. Feel like bubbles/butterflies. 1-2x/day, can be brief to 30 seconds. Makes him sick to his stomach. Also going on the last month, worse in the last week.   Denies shortness of breath at rest or with normal exertion. No PND, orthopnea, or unexpected weight gain. Has lost 27 lbs unintentionally in last 3-4 months. Eats well, great appetite. Has been told he has irritable bowel, no testing. End of day has mild LE edema.  Past Medical History:  Diagnosis Date  . Arthritis    DDD  . Bladder cancer ([place])   . Bladder cancer ([place]) 08/17/2018  . CAD in native artery 08/17/2018  . Current nicotine use 08/17/2018  .  Diabetes mellitus without complication ([place])   . GERD (gastroesophageal reflux disease)   . H/O: stroke   . Heart attack ([place])    2 stents  . Hx of hemorrhoids 08/17/2018  . Hyperlipidemia   . Hypertension   . Peripheral neuropathy 08/17/2018  . Stroke ([place]) 57  . Visual changes 08/17/2018    Past Surgical History:  Procedure Laterality Date  . BLADDER TUMOR EXCISION      cancer  . CAROTID ENDARTERECTOMY Left   . CORONARY ANGIOPLASTY WITH STENT PLACEMENT     2 stent  . HERNIA REPAIR     right inguinal 2017    Current Medications: Current Outpatient Medications on File Prior to Visit  Medication Sig  . aspirin EC 81 MG tablet Take 1 tablet (81 mg total) by mouth daily.  . carvedilol (COREG) 3.125 MG tablet Take 1 tablet (3.125 mg total) by mouth 2 (two) times daily with a meal.  . Continuous Blood Gluc Sensor (FREESTYLE LIBRE 14 DAY SENSOR) MISC Use every 14 days to check sugars daily  . esomeprazole (NEXIUM) 40 MG capsule Take by mouth.  . gabapentin (NEURONTIN) 100 MG capsule Take 1-3 capsules (100-300 mg total) by mouth at bedtime.  [name] glipiZIDE (GLUCOTROL XL) 5 MG 24 hr tablet Take 1 tablet by mouth daily.  [name] glucose blood (ONETOUCH ULTRA) test strip USE TWICE DAILY AS DIRECTED ( E11.65)  . glucose blood (PRECISION QID TEST) test strip Use twice daily as directed  . metFORMIN (GLUCOPHAGE) 1000 MG tablet Take by mouth.  . nitroGLYCERIN (NITROSTAT) 0.4 MG SL tablet Place 1 tablet (0.4 mg total) under the tongue every 5 (five) minutes as needed for chest pain.   No current facility-administered medications on file prior to visit.      Allergies:   Lipitor [atorvastatin calcium], Statins, and Morphine   Social History   Socioeconomic History  . Marital status: Married    Spouse name: Not on file  . Number of children: Not on file  . Years of education: Not on file  . Highest education level: Not on file  Occupational History  . Not on file  Social Needs  . Financial resource strain: Not on file  . Food insecurity    Worry: Not on file    Inability: Not on file  . Transportation needs    Medical: Not on file    Non-medical: Not on file  Tobacco Use  . Smoking status: Current Every Day Smoker  . Smokeless tobacco: Current User  Substance and Sexual Activity  . Alcohol use: Not Currently  . Drug use: Not Currently  . Sexual activity:  Yes  Lifestyle  . Physical activity    Days per week: Not on file    Minutes per session: Not on file  . Stress: Not on file  Relationships  . Social Herbalist on phone: Not on file    Gets together: Not on file    Attends religious service: Not on file    Active member of club or organization: Not on file    Attends meetings of clubs or organizations: Not on file    Relationship status: Not on file  Other Topics Concern  . 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     Family History: The patient's family history includes  Addison's disease in his sister; Arthritis in his father and mother; COPD in his mother; Dementia in his maternal grandfather, maternal grandmother, and mother; Diabetes in his brother, father, maternal grandfather, maternal grandmother, mother, and sister; Heart disease in his father, maternal grandfather, maternal grandmother, and mother; Hyperlipidemia in his brother, father, maternal grandfather, maternal grandmother, and sister; Hypertension in his brother and sister; Obesity in his brother, brother, and sister; Osteoporosis in his father and mother; Other in his paternal grandmother; Parkinson's disease in his father; Prostate cancer in his paternal grandfather; Rheumatic fever in his son; Stickler syndrome in his son.  ROS:   Please see the history of present illness.  Additional pertinent ROS: Constitutional: Negative for chills, fever, night sweats, Positive for unintentional weight loss (27 lbs). HENT: Negative for ear pain and hearing loss.   Eyes: Negative for loss of vision and eye pain.  Respiratory: Negative for cough, sputum, wheezing.   Cardiovascular: See HPI. Gastrointestinal: Negative for abdominal pain, melena, and hematochezia.  Genitourinary: Negative for dysuria and hematuria.   Musculoskeletal: Negative for falls. Chronic neuropathy/myalgia. Skin: Negative for itching and rash.  Neurological: Negative for focal weakness, focal sensory changes and loss of consciousness.  Endo/Heme/Allergies: Does bruise/bleed easily.     EKGs/Labs/Other Studies Reviewed:    The following studies were reviewed today: No prior cardiac records available  EKG:  EKG is personally reviewed.  The ekg ordered today demonstrates NSR  Recent Labs: 08/23/2018: ALT 6; BUN 16; Creatinine, Ser 0.91; Hemoglobin 16.7; Platelets 290; Potassium 5.0; Sodium 135  Recent Lipid Panel    Component Value Date/Time   CHOL 218 (H) 08/23/2018 1148   TRIG 254 (H) 08/23/2018 1148   HDL 29 (L) 08/23/2018 1148   CHOLHDL 7.5 (H) 08/23/2018 1148   LDLCALC 138 (H) 08/23/2018 1148    Physical Exam:    VS:  BP 124/62   Pulse 77   Temp (!) 97.1 F (36.2 C)   Ht 5' (1.524 m)   Wt 150 lb 9.6 oz (68.3 kg)   SpO2 96%   BMI 29.41 kg/m     Wt Readings from Last 3 Encounters:  08/23/18 150 lb 9.6 oz (68.3 kg)  08/16/18 150 lb (68 kg)     GEN: Well nourished, well developed in no acute distress HEENT: Normal, moist mucous membranes NECK: No JVD CARDIAC: regular rhythm, normal S1 and S2, no murmurs, rubs, gallops.  VASCULAR: Radial and DP pulses 2+ bilaterally. No carotid bruits RESPIRATORY:  Clear to auscultation without rales, wheezing or rhonchi  ABDOMEN: Soft, non-tender, non-distended MUSCULOSKELETAL:  Ambulates independently SKIN: Warm and dry, no edema NEUROLOGIC:  Alert and oriented x 3. No focal neuro deficits noted. PSYCHIATRIC:  Normal affect    ASSESSMENT:    1. Chest pain, unspecified type   2. Diabetes mellitus without complication ([place])   3. Hyperlipidemia, unspecified hyperlipidemia type   4. Pre-procedure lab exam   5. Coronary artery disease involving native coronary artery of native heart with other form of angina pectoris ([place])   6. Tobacco abuse counseling   7. Carotid  artery disease, unspecified laterality, unspecified type ([place])    PLAN:    Chest pain, with history of CAD and prior stents: very concerning, as he reports it is the same as his prior MI pain. However, has atypical components, short lasting, intermittent. Not becoming more frequent. Reports that he had a normal stress test prior to getting his stents, so he has low confidence in noninvasive testing. Not a  good CT coronary candidate with prior stents.   We discussed multiple options for evaluation today. Shared decision making led to decision to pursue cath.   Risks and benefits of cardiac catheterization have been discussed with the patient.  These include bleeding, infection, kidney damage, stroke, heart attack, death.  The patient understands these risks and is willing to proceed.  -continue aspirin 81 mg -intolerant of statins, see below -does not endorse prior systolic dysfunction, but based on results of cath may need to pursue echo -on carvedilol 3.125 mg BID  Hypercholesterolemia: LDL goal <70. Checked lipids today, LDL 138, TG 254 (not fasting) -intolerant of atorvastatin at a minimum, had whole body hives and joint pain -will refer for PCSK9i therapy discussion  Tobacco abuse: The patient was counseled on tobacco cessation today for 5 minutes.  Counseling included reviewing the risks of smoking tobacco products, how it impacts the patient's current medical diagnoses and different strategies for quitting.  Pharmacotherapy to aid in tobacco cessation was not prescribed today.  Cardiac risk counseling and prevention recommendations: -recommend heart healthy/Mediterranean diet, with whole grains, fruits, vegetable, fish, lean meats, nuts, and olive oil. Limit salt. -recommend moderate walking, 3-5 times/week for 30-50 minutes each session. Aim for at least 150 minutes.week. Goal should be pace of 3 miles/hours, or walking 1.5 miles in 30 minutes -recommend avoidance of tobacco products.  Avoid excess alcohol. -Additional risk factor control:  -Diabetes: A1c is 9.3, poorly controlled on glipizide and metformin. Would benefit from SGLT2i or GLP1 RA given CAD history. Will defer to Dr. Charlett Blake.  -Lipids: as above  -Blood pressure control: at goal on carvedilol alone  -Weight: BMI 29  Plan for follow up: 2 weeks to discuss optimizing meds after cath  Medication Adjustments/Labs and Tests Ordered: Current medicines are reviewed at length with the patient today.  Concerns regarding medicines are outlined above.  Orders Placed This Encounter  Procedures  . Lipid panel  . Comprehensive metabolic panel  . Hemoglobin A1c  . CBC  . EKG 12-Lead   No orders of the defined types were placed in this encounter.   Patient Instructions  Medication Instructions:  Your Physician recommend you continue on your current medication as directed.    If you need a refill on your cardiac medications before your next appointment, please call your pharmacy.   Lab work: Your physician recommends that you return for lab work today (CBC, CMP, Lipid, A1C)  If you have labs (blood work) drawn today and your tests are completely normal, you will receive your results only by: [name] MyChart Message (if you have MyChart) OR . A paper copy in the mail If you have any lab test that is abnormal or we need to change your treatment, we will call you to review the results.  Testing/Procedures: Your physician has requested that you have a cardiac catheterization. Cardiac catheterization is used to diagnose and/or treat various heart conditions. Doctors may recommend this procedure for a number of different reasons. The most common reason is to evaluate chest pain. Chest pain can be a symptom of coronary artery disease (CAD), and cardiac catheterization can show whether plaque is narrowing or blocking your heart's arteries. This procedure is also used to evaluate the valves, as well as measure the blood flow and  oxygen levels in different parts of your heart. For further information please visit HugeFiesta.[email or web address]. Please follow instruction sheet, as given.  [facility]  COVID-19 test August, 21 @ 2:55 pm [address]  [place], [place] [address]   Follow-Up: At [facility], you and your health needs are our priority.  As part of our continuing mission to provide you with exceptional heart care, we have created designated Provider Care Teams.  These Care Teams include your primary Cardiologist (physician) and Advanced Practice Providers (APPs -  Physician Assistants and Nurse Practitioners) who all work together to provide you with the care you need, when you need it. You will need a follow up appointment in 2 weeks.  Please call our office 2 months in advance to schedule this appointment.  You may see Dr. Harrell Gave or one of the following Advanced Practice Providers on your designated Care Team:   Rosaria Ferries, PA-C . Jory Sims, DNP, ANP      [facility] CARDIOVASCULAR DIVISION [facility] [address] [place] [address] Dept: [phone] Loc: [place]  08/23/2018  You are scheduled for a Cardiac Catheterization on Tuesday, August 25 with Dr. Daneen Schick.  1. Please arrive at the [facility] (Main Entrance A) at [facility]: [address] [place], [place] [address] at 5:30 AM (This time is two hours before your procedure to ensure your preparation). Free valet parking service is available.   Special note: Every effort is made to have your procedure done on time. Please understand that emergencies sometimes delay scheduled procedures.  2. Diet: Do not eat solid foods after midnight.  The patient may have clear liquids until 5am upon the day of the procedure.  3. Labs: You will need to have blood drawn today (CMP, CBC, Lipid, A1C)  4. Medication instructions in preparation for your  procedure:   Contrast Allergy: No   Hold Glipizide and Metformin morning of procedure  Hold Metformin  48 hours after procedure  On the morning of your procedure, take your Aspirin and any morning medicines NOT listed above.  You may use sips of water.  5. Plan for one night stay--bring personal belongings. 6. Bring a current list of your medications and current insurance cards. 7. You MUST have a responsible person to drive you home. 8. Someone MUST be with you the first 24 hours after you arrive home or your discharge will be delayed. 9. Please wear clothes that are easy to get on and off and wear slip-on shoes.  Thank you for allowing [place] to care for you!   -- [place] [facility]       Signed, Dean Dresser, MD PhD 08/23/2018 10:38 AM    [place]

## 2018-08-23 ENCOUNTER — Ambulatory Visit (INDEPENDENT_AMBULATORY_CARE_PROVIDER_SITE_OTHER): Payer: Medicare Other | Admitting: Cardiology

## 2018-08-23 ENCOUNTER — Encounter: Payer: Self-pay | Admitting: Cardiology

## 2018-08-23 ENCOUNTER — Other Ambulatory Visit: Payer: Self-pay

## 2018-08-23 VITALS — BP 124/62 | HR 77 | Temp 97.1°F | Ht 60.0 in | Wt 150.6 lb

## 2018-08-23 DIAGNOSIS — Z01812 Encounter for preprocedural laboratory examination: Secondary | ICD-10-CM

## 2018-08-23 DIAGNOSIS — R079 Chest pain, unspecified: Secondary | ICD-10-CM

## 2018-08-23 DIAGNOSIS — E785 Hyperlipidemia, unspecified: Secondary | ICD-10-CM

## 2018-08-23 DIAGNOSIS — I739 Peripheral vascular disease, unspecified: Secondary | ICD-10-CM

## 2018-08-23 DIAGNOSIS — F17210 Nicotine dependence, cigarettes, uncomplicated: Secondary | ICD-10-CM | POA: Diagnosis not present

## 2018-08-23 DIAGNOSIS — I25118 Atherosclerotic heart disease of native coronary artery with other forms of angina pectoris: Secondary | ICD-10-CM

## 2018-08-23 DIAGNOSIS — E119 Type 2 diabetes mellitus without complications: Secondary | ICD-10-CM

## 2018-08-23 DIAGNOSIS — I2510 Atherosclerotic heart disease of native coronary artery without angina pectoris: Secondary | ICD-10-CM | POA: Diagnosis not present

## 2018-08-23 DIAGNOSIS — Z716 Tobacco abuse counseling: Secondary | ICD-10-CM

## 2018-08-23 DIAGNOSIS — I779 Disorder of arteries and arterioles, unspecified: Secondary | ICD-10-CM

## 2018-08-23 NOTE — Patient Instructions (Signed)
Medication Instructions:  Your Physician recommend you continue on your current medication as directed.    If you need a refill on your cardiac medications before your next appointment, please call your pharmacy.   Lab work: Your physician recommends that you return for lab work today (CBC, CMP, Lipid, A1C)  If you have labs (blood work) drawn today and your tests are completely normal, you will receive your results only by:  MyChart Message (if you have MyChart) OR  A paper copy in the mail If you have any lab test that is abnormal or we need to change your treatment, we will call you to review the results.  Testing/Procedures: Your physician has requested that you have a cardiac catheterization. Cardiac catheterization is used to diagnose and/or treat various heart conditions. Doctors may recommend this procedure for a number of different reasons. The most common reason is to evaluate chest pain. Chest pain can be a symptom of coronary artery disease (CAD), and cardiac catheterization can show whether plaque is narrowing or blocking your hearts arteries. This procedure is also used to evaluate the valves, as well as measure the blood flow and oxygen levels in different parts of your heart. For further information please visit HugeFiesta.tn. Please follow instruction sheet, as given.  [facility]  COVID-19 test August, 21 @ 2:55 pm [place], [place] [address]   Follow-Up: At [facility], you and your health needs are our priority.  As part of our continuing mission to provide you with exceptional heart care, we have created designated Provider Care Teams.  These Care Teams include your primary Cardiologist (physician) and Advanced Practice Providers (APPs -  Physician Assistants and Nurse Practitioners) who all work together to provide you with the care you need, when you need it. You will need a follow up appointment in 2 weeks.  Please call our office 2  months in advance to schedule this appointment.  You may see Dr. Harrell Gave or one of the following Advanced Practice Providers on your designated Care Team:   Rosaria Ferries, PA-C  Jory Sims, DNP, ANP      [place] [address] Dept: [phone] Loc: [place]  08/23/2018  You are scheduled for a Cardiac Catheterization on Tuesday, August 25 with Dr. Daneen Schick.  1. Please arrive at the [facility] (Main Entrance A) at [facility]: [address] [place], [place] [address] at 5:30 AM (This time is two hours before your procedure to ensure your preparation). Free valet parking service is available.   Special note: Every effort is made to have your procedure done on time. Please understand that emergencies sometimes delay scheduled procedures.  2. Diet: Do not eat solid foods after midnight.  The patient may have clear liquids until 5am upon the day of the procedure.  3. Labs: You will need to have blood drawn today (CMP, CBC, Lipid, A1C)  4. Medication instructions in preparation for your procedure:   Contrast Allergy: No   Hold Glipizide and Metformin morning of procedure  Hold Metformin  48 hours after procedure  On the morning of your procedure, take your Aspirin and any morning medicines NOT listed above.  You may use sips of water.  5. Plan for one night stay--bring personal belongings. 6. Bring a current list of your medications and current insurance cards. 7. You MUST have a responsible person  to drive you home. 8. Someone MUST be with you the first 24 hours after you arrive home or your discharge will be delayed. 9. Please wear clothes that are easy to get on and off and wear slip-on shoes.  Thank you for allowing [place] to care for you!   -- [place] [facility]

## 2018-08-24 LAB — COMPREHENSIVE METABOLIC PANEL
ALT: 6 IU/L (ref 0–44)
AST: 11 IU/L (ref 0–40)
Albumin/Globulin Ratio: 1.8 (ref 1.2–2.2)
Albumin: 4.9 g/dL (ref 3.8–4.9)
Alkaline Phosphatase: 82 IU/L (ref 39–117)
BUN/Creatinine Ratio: 18 (ref 9–20)
BUN: 16 mg/dL (ref 6–24)
Bilirubin Total: 0.5 mg/dL (ref 0.0–1.2)
CO2: 22 mmol/L (ref 20–29)
Calcium: 10.2 mg/dL (ref 8.7–10.2)
Chloride: 96 mmol/L (ref 96–106)
Creatinine, Ser: 0.91 mg/dL (ref 0.76–1.27)
GFR calc Af Amer: 108 mL/min/{1.73_m2} (ref >59)
GFR calc non Af Amer: 93 mL/min/{1.73_m2} (ref >59)
Globulin, Total: 2.7 g/dL (ref 1.5–4.5)
Glucose: 235 mg/dL — ABNORMAL HIGH (ref 65–99)
Potassium: 5 mmol/L (ref 3.5–5.2)
Sodium: 135 mmol/L (ref 134–144)
Total Protein: 7.6 g/dL (ref 6.0–8.5)

## 2018-08-24 LAB — CBC
Hematocrit: 47.9 % (ref 37.5–51.0)
Hemoglobin: 16.7 g/dL (ref 13.0–17.7)
MCH: 31.7 pg (ref 26.6–33.0)
MCHC: 34.9 g/dL (ref 31.5–35.7)
MCV: 91 fL (ref 79–97)
Platelets: 290 10*3/uL (ref 150–450)
RBC: 5.26 x10E6/uL (ref 4.14–5.80)
RDW: 12.5 % (ref 11.6–15.4)
WBC: 9.6 10*3/uL (ref 3.4–10.8)

## 2018-08-24 LAB — HEMOGLOBIN A1C
Est. average glucose Bld gHb Est-mCnc: 220 mg/dL
Hgb A1c MFr Bld: 9.3 % — ABNORMAL HIGH (ref 4.8–5.6)

## 2018-08-24 LAB — LIPID PANEL
Chol/HDL Ratio: 7.5 ratio — ABNORMAL HIGH (ref 0.0–5.0)
Cholesterol, Total: 218 mg/dL — ABNORMAL HIGH (ref 100–199)
HDL: 29 mg/dL — ABNORMAL LOW (ref >39)
LDL Calculated: 138 mg/dL — ABNORMAL HIGH (ref 0–99)
Triglycerides: 254 mg/dL — ABNORMAL HIGH (ref 0–149)
VLDL Cholesterol Cal: 51 mg/dL — ABNORMAL HIGH (ref 5–40)

## 2018-08-26 ENCOUNTER — Telehealth: Payer: Self-pay

## 2018-08-26 ENCOUNTER — Other Ambulatory Visit (HOSPITAL_COMMUNITY): Payer: Medicare Other

## 2018-08-26 NOTE — Telephone Encounter (Signed)
Pt report he is unable to have COVID test preformed on today 8/21. Pt state he is ok to reschedule Cath in order to have COVID test preformed within time frame. Nurse spoke to Cath lab and reschedule procedure for Friday 8/28 at 5:30 am with Dr. Burt Knack and COVID test on Tuesday 8/25 at 3:05 pm. Pt made aware and voiced understanding.

## 2018-08-29 ENCOUNTER — Encounter: Payer: Self-pay | Admitting: Cardiology

## 2018-08-30 ENCOUNTER — Other Ambulatory Visit (HOSPITAL_COMMUNITY)
Admission: RE | Admit: 2018-08-30 | Discharge: 2018-08-30 | Disposition: A | Discharge status: Home / Self Care | Payer: Medicare Other | Source: Ambulatory Visit | Attending: Cardiovascular Disease | Admitting: Cardiovascular Disease

## 2018-08-30 ENCOUNTER — Other Ambulatory Visit (HOSPITAL_COMMUNITY): Payer: Self-pay | Admitting: *Deleted

## 2018-08-30 DIAGNOSIS — Z01812 Encounter for preprocedural laboratory examination: Secondary | ICD-10-CM | POA: Insufficient documentation

## 2018-08-30 DIAGNOSIS — Z20828 Contact with and (suspected) exposure to other viral communicable diseases: Secondary | ICD-10-CM | POA: Insufficient documentation

## 2018-08-30 LAB — SARS CORONAVIRUS 2 (TAT 6-24 HRS): SARS Coronavirus 2: NEGATIVE

## 2018-09-01 ENCOUNTER — Telehealth: Payer: Self-pay | Admitting: *Deleted

## 2018-09-01 NOTE — Telephone Encounter (Signed)
Pt contacted pre-catheterization scheduled at [facility] for: Friday August 28,2020 7:30AM Verified arrival time and place: [facility]) at: 5:30 AM   No solid food after midnight prior to cath, clear liquids until 5 AM day of procedure. Contrast allergy: no  Hold: Metformin-day of procedure and 48 hours post procedure. Glipizide -AM of procedure.  Except hold medications AM meds can be  taken pre-cath with sip of water including: ASA 81 mg   Confirmed patient has responsible person to drive home post procedure and observe 24 hours after arriving home: yes  Currently, due to Covid-19 pandemic, only one support person will be allowed with patient. Must be the same support person for that patient's entire stay, will be screened and required to wear a mask. They will be asked to wait in the waiting room for the duration of the patient's stay.  Patients are required to wear a mask when they enter the hospital.      COVID-19 Pre-Screening Questions:  . In the past 7 to 10 days have you had a cough,  shortness of breath, headache, congestion, fever (100 or greater) body aches, chills, sore throat, or sudden loss of taste or sense of smell? no . Have you been around anyone with known Covid 19? no . Have you been around anyone who is awaiting Covid 19 test results in the past 7 to 10 days? no . Have you been around anyone who has been exposed to Covid 19, or has mentioned symptoms of Covid 19 within the past 7 to 10 days? no   I reviewed procedure/mask/visitor, Covid-19 screening questions with patient, he verbalized understanding, thanked me for call.

## 2018-09-02 ENCOUNTER — Encounter (HOSPITAL_COMMUNITY)
Admission: RE | Disposition: A | Discharge status: Home / Self Care | Payer: Self-pay | Source: Home / Self Care | Attending: Cardiovascular Disease

## 2018-09-02 ENCOUNTER — Other Ambulatory Visit: Payer: Self-pay

## 2018-09-02 ENCOUNTER — Ambulatory Visit (HOSPITAL_COMMUNITY)
Admission: RE | Admit: 2018-09-02 | Discharge: 2018-09-02 | Disposition: A | Discharge status: Home / Self Care | Payer: Medicare Other | Attending: Cardiovascular Disease | Admitting: Cardiovascular Disease

## 2018-09-02 DIAGNOSIS — Z833 Family history of diabetes mellitus: Secondary | ICD-10-CM | POA: Insufficient documentation

## 2018-09-02 DIAGNOSIS — E7800 Pure hypercholesterolemia, unspecified: Secondary | ICD-10-CM | POA: Diagnosis not present

## 2018-09-02 DIAGNOSIS — E1142 Type 2 diabetes mellitus with diabetic polyneuropathy: Secondary | ICD-10-CM | POA: Insufficient documentation

## 2018-09-02 DIAGNOSIS — Z7982 Long term (current) use of aspirin: Secondary | ICD-10-CM | POA: Diagnosis not present

## 2018-09-02 DIAGNOSIS — I25119 Atherosclerotic heart disease of native coronary artery with unspecified angina pectoris: Secondary | ICD-10-CM | POA: Diagnosis present

## 2018-09-02 DIAGNOSIS — I252 Old myocardial infarction: Secondary | ICD-10-CM | POA: Insufficient documentation

## 2018-09-02 DIAGNOSIS — E785 Hyperlipidemia, unspecified: Secondary | ICD-10-CM | POA: Diagnosis not present

## 2018-09-02 DIAGNOSIS — Z885 Allergy status to narcotic agent status: Secondary | ICD-10-CM | POA: Diagnosis not present

## 2018-09-02 DIAGNOSIS — I10 Essential (primary) hypertension: Secondary | ICD-10-CM | POA: Diagnosis not present

## 2018-09-02 DIAGNOSIS — K219 Gastro-esophageal reflux disease without esophagitis: Secondary | ICD-10-CM | POA: Insufficient documentation

## 2018-09-02 DIAGNOSIS — Z79899 Other long term (current) drug therapy: Secondary | ICD-10-CM | POA: Diagnosis not present

## 2018-09-02 DIAGNOSIS — M1990 Unspecified osteoarthritis, unspecified site: Secondary | ICD-10-CM | POA: Diagnosis not present

## 2018-09-02 DIAGNOSIS — Z8249 Family history of ischemic heart disease and other diseases of the circulatory system: Secondary | ICD-10-CM | POA: Diagnosis not present

## 2018-09-02 DIAGNOSIS — Z8673 Personal history of transient ischemic attack (TIA), and cerebral infarction without residual deficits: Secondary | ICD-10-CM | POA: Insufficient documentation

## 2018-09-02 DIAGNOSIS — Z888 Allergy status to other drugs, medicaments and biological substances status: Secondary | ICD-10-CM | POA: Insufficient documentation

## 2018-09-02 DIAGNOSIS — F17210 Nicotine dependence, cigarettes, uncomplicated: Secondary | ICD-10-CM | POA: Diagnosis not present

## 2018-09-02 DIAGNOSIS — Z955 Presence of coronary angioplasty implant and graft: Secondary | ICD-10-CM | POA: Diagnosis not present

## 2018-09-02 DIAGNOSIS — Z95820 Peripheral vascular angioplasty status with implants and grafts: Secondary | ICD-10-CM

## 2018-09-02 DIAGNOSIS — Z8262 Family history of osteoporosis: Secondary | ICD-10-CM | POA: Insufficient documentation

## 2018-09-02 DIAGNOSIS — Z7984 Long term (current) use of oral hypoglycemic drugs: Secondary | ICD-10-CM | POA: Diagnosis not present

## 2018-09-02 HISTORY — PX: LEFT HEART CATH AND CORONARY ANGIOGRAPHY: CATH118249

## 2018-09-02 HISTORY — PX: CORONARY STENT INTERVENTION: CATH118234

## 2018-09-02 LAB — GLUCOSE, CAPILLARY
Glucose-Capillary: 248 mg/dL — ABNORMAL HIGH (ref 70–99)
Glucose-Capillary: 234 mg/dL — ABNORMAL HIGH (ref 70–99)

## 2018-09-02 LAB — POCT ACTIVATED CLOTTING TIME
Activated Clotting Time: 213 seconds
Activated Clotting Time: 274 s

## 2018-09-02 SURGERY — LEFT HEART CATH AND CORONARY ANGIOGRAPHY
Anesthesia: LOCAL

## 2018-09-02 MED ORDER — VERAPAMIL HCL 2.5 MG/ML IV SOLN
INTRAVENOUS | Status: AC
  Filled 2018-09-02: qty 2

## 2018-09-02 MED ORDER — THE SENSUOUS HEART BOOK
Status: AC
  Filled 2018-09-02: qty 1

## 2018-09-02 MED ORDER — CLOPIDOGREL BISULFATE 300 MG PO TABS
ORAL_TABLET | ORAL | Status: DC | PRN
  Administered 2018-09-02: 600 mg via ORAL

## 2018-09-02 MED ORDER — SODIUM CHLORIDE 0.9 % WEIGHT BASED INFUSION
3.0000 mL/kg/h | INTRAVENOUS | Status: AC
  Administered 2018-09-02: 06:00:00 3 mL/kg/h via INTRAVENOUS

## 2018-09-02 MED ORDER — NITROGLYCERIN 1 MG/10 ML FOR IR/CATH LAB
INTRA_ARTERIAL | Status: AC
  Filled 2018-09-02: qty 10

## 2018-09-02 MED ORDER — FAMOTIDINE IN NACL 20-0.9 MG/50ML-% IV SOLN
INTRAVENOUS | Status: AC
  Filled 2018-09-02: qty 50

## 2018-09-02 MED ORDER — CLOPIDOGREL BISULFATE 300 MG PO TABS
ORAL_TABLET | ORAL | Status: AC
  Filled 2018-09-02: qty 2

## 2018-09-02 MED ORDER — SODIUM CHLORIDE 0.9 % WEIGHT BASED INFUSION: 1.0000 mL/kg/h | INTRAVENOUS | Status: DC

## 2018-09-02 MED ORDER — SODIUM CHLORIDE 0.9 % IV SOLN: 250.0000 mL | INTRAVENOUS | Status: DC | PRN

## 2018-09-02 MED ORDER — CLOPIDOGREL BISULFATE 75 MG PO TABS: 75.0000 mg | ORAL_TABLET | Freq: Every day | ORAL | 0 refills | Status: DC

## 2018-09-02 MED ORDER — CLOPIDOGREL BISULFATE 75 MG PO TABS: 75.0000 mg | ORAL_TABLET | Freq: Every day | ORAL | 3 refills | Status: DC

## 2018-09-02 MED ORDER — VERAPAMIL HCL 2.5 MG/ML IV SOLN
INTRAVENOUS | Status: DC | PRN
  Administered 2018-09-02: 10 mL via INTRA_ARTERIAL

## 2018-09-02 MED ORDER — NITROGLYCERIN 1 MG/10 ML FOR IR/CATH LAB
INTRA_ARTERIAL | Status: DC | PRN
  Administered 2018-09-02 (×3): 150 ug via INTRACORONARY

## 2018-09-02 MED ORDER — FENTANYL CITRATE (PF) 100 MCG/2ML IJ SOLN
INTRAMUSCULAR | Status: DC | PRN
  Administered 2018-09-02: 25 ug via INTRAVENOUS

## 2018-09-02 MED ORDER — SODIUM CHLORIDE 0.9% FLUSH: 3.0000 mL | Freq: Two times a day (BID) | INTRAVENOUS | Status: DC

## 2018-09-02 MED ORDER — LIDOCAINE HCL (PF) 1 % IJ SOLN
INTRAMUSCULAR | Status: AC
  Filled 2018-09-02: qty 30

## 2018-09-02 MED ORDER — FENTANYL CITRATE (PF) 100 MCG/2ML IJ SOLN
INTRAMUSCULAR | Status: AC
  Filled 2018-09-02: qty 2

## 2018-09-02 MED ORDER — HEPARIN (PORCINE) IN NACL 1000-0.9 UT/500ML-% IV SOLN
INTRAVENOUS | Status: AC
  Filled 2018-09-02: qty 1000

## 2018-09-02 MED ORDER — PANTOPRAZOLE SODIUM 40 MG PO TBEC
40.0000 mg | DELAYED_RELEASE_TABLET | Freq: Every day | ORAL | Status: DC
  Filled 2018-09-02: qty 1

## 2018-09-02 MED ORDER — HEPARIN SODIUM (PORCINE) 1000 UNIT/ML IJ SOLN
INTRAMUSCULAR | Status: DC | PRN
  Administered 2018-09-02: 4000 [IU] via INTRAVENOUS
  Administered 2018-09-02: 5000 [IU] via INTRAVENOUS

## 2018-09-02 MED ORDER — HEPARIN (PORCINE) IN NACL 1000-0.9 UT/500ML-% IV SOLN
INTRAVENOUS | Status: DC | PRN
  Administered 2018-09-02: 500 mL

## 2018-09-02 MED ORDER — ASPIRIN 81 MG PO CHEW: 81.0000 mg | CHEWABLE_TABLET | ORAL | Status: DC

## 2018-09-02 MED ORDER — LIDOCAINE HCL (PF) 1 % IJ SOLN
INTRAMUSCULAR | Status: DC | PRN
  Administered 2018-09-02: 2 mL

## 2018-09-02 MED ORDER — SODIUM CHLORIDE 0.9% FLUSH: 3.0000 mL | INTRAVENOUS | Status: DC | PRN

## 2018-09-02 MED ORDER — HEPARIN SODIUM (PORCINE) 1000 UNIT/ML IJ SOLN
INTRAMUSCULAR | Status: AC
  Filled 2018-09-02: qty 1

## 2018-09-02 MED ORDER — PANTOPRAZOLE SODIUM 40 MG PO TBEC: 40.0000 mg | DELAYED_RELEASE_TABLET | Freq: Every day | ORAL | 2 refills | Status: DC

## 2018-09-02 MED ORDER — MIDAZOLAM HCL 2 MG/2ML IJ SOLN
INTRAMUSCULAR | Status: AC
  Filled 2018-09-02: qty 2

## 2018-09-02 MED ORDER — IOHEXOL 350 MG/ML SOLN
INTRAVENOUS | Status: DC | PRN
  Administered 2018-09-02: 140 mL via INTRA_ARTERIAL

## 2018-09-02 MED ORDER — MIDAZOLAM HCL 2 MG/2ML IJ SOLN
INTRAMUSCULAR | Status: DC | PRN
  Administered 2018-09-02: 2 mg via INTRAVENOUS

## 2018-09-02 MED ORDER — PANTOPRAZOLE SODIUM 40 MG PO TBEC: 40.0000 mg | DELAYED_RELEASE_TABLET | Freq: Every day | ORAL | Status: DC

## 2018-09-02 MED ORDER — FAMOTIDINE IN NACL 20-0.9 MG/50ML-% IV SOLN
INTRAVENOUS | Status: AC | PRN
  Administered 2018-09-02: 20 mg via INTRAVENOUS

## 2018-09-02 MED FILL — PANTOPRAZOLE SOD DR 40 MG T: 40 | 30 days supply | Qty: 30 | Fill #0

## 2018-09-02 MED FILL — CLOPIDOGREL 75 MG TABLET: 75 | 30 days supply | Qty: 30 | Fill #0

## 2018-09-02 SURGICAL SUPPLY — 19 items
BALLN SAPPHIRE 2.0X12 (BALLOONS) ×2
BALLN SAPPHIRE ~~LOC~~ 3.0X15 (BALLOONS) ×1 IMPLANT
BALLN SAPPHIRE ~~LOC~~ 3.5X15 (BALLOONS) ×1 IMPLANT
BALLOON SAPPHIRE 2.0X12 (BALLOONS) IMPLANT
CATH 5FR JL3.5 JR4 ANG PIG MP (CATHETERS) ×1 IMPLANT
CATH LAUNCHER 6FR EBU3.5 (CATHETERS) ×1 IMPLANT
DEVICE RAD COMP TR BAND LRG (VASCULAR PRODUCTS) ×1 IMPLANT
GLIDESHEATH SLEND SS 6F .021 (SHEATH) ×1 IMPLANT
GUIDEWIRE INQWIRE 1.5J.035X260 (WIRE) IMPLANT
INQWIRE 1.5J .035X260CM (WIRE) ×2
KIT ENCORE 26 ADVANTAGE (KITS) ×1 IMPLANT
KIT HEART LEFT (KITS) ×2 IMPLANT
PACK CARDIAC CATHETERIZATION (CUSTOM PROCEDURE TRAY) ×2 IMPLANT
STENT RESOLUTE ONYX 2.5X30 (Permanent Stent) ×1 IMPLANT
STENT RESOLUTE ONYX 3.0X26 (Permanent Stent) ×1 IMPLANT
SYR MEDRAD MARK 7 150ML (SYRINGE) ×2 IMPLANT
TRANSDUCER W/STOPCOCK (MISCELLANEOUS) ×2 IMPLANT
TUBING CIL FLEX 10 FLL-RA (TUBING) ×2 IMPLANT
WIRE COUGAR XT STRL 190CM (WIRE) ×1 IMPLANT

## 2018-09-02 NOTE — Discharge Summary (Signed)
Discharge Summary    Patient ID: Dean Singh MRN: [id number]; DOB: [birth year]  Admit date: 09/02/2018 Discharge date: 09/02/2018  Primary Care Provider: Mosie Lukes, MD  Primary Cardiologist: Buford Dresser, MD   Discharge Diagnoses    Active Problems:   Coronary artery disease involving native coronary artery with angina pectoris ([place])  Allergies Allergies  Allergen Reactions  . Statins Itching    Atorvastatin cause "my skin to feel horrible and unbearable".  . Morphine Nausea And Vomiting    "felt like chest was exploding" pt reports    Diagnostic Studies/Procedures    Cardiac catheterization 09/02/2018:  1.  Left dominant coronary system with continued patency of the stented segment in the proximal/mid LAD and severe de novo stenosis in the mid and distal circumflex 2.  Successful PCI of the mid and distal circumflex using a 2.5 x 30 mm resolute Onyx DES in the distal vessel and a 3.0 x 26 mm resolute Onyx DES in the mid vessel 3.  Normal LV systolic function with normal LVEDP.  Plan: same day PCI, Pt loaded with clopidogrel on the table. Should remain on DAPT with ASA and clopidogrel at least 6 months (stable CAD recommendation).  History of Present Illness    Dean Singh is a 58 y.o. male with a hx of CAD with 2 prior stents, CVA, tobacco abuse, hyperlipidemia, uncontrolled type II diabetes who is seen as a new consult at the request of Mosie Lukes, MD for the evaluation and management of chest pain. Pertinent non-CV medical history includes bladder cancer, severe peripheral neuropathy, DDD, GERD.  He was seen on 08/23/2018 by Dr. Harrell Gave with c/o of chest pain. At that time, he reported no pain since 2015 until about 1 prior to his appointment. He stated that the pain was severe and would radiate to his throat to left arm, described as a sharp and severe. Stated that the pain was very similar to his prior MI.  Prior cardiac history dates back  to 09/2013 in [place] where he had two stents placed, was on prasugrel and aspirin. Also with a history of carotid endarterectomy. Also had c/o palpitations described at bubbles/buterflies which would occur 1-2x day and last on ly approximately 30 seconds in duration.   Denied shortness of breath at rest or with normal exertion. No PND, orthopnea, or unexpected weight gain. Noted to have lost 27 lbs unintentionally in last 3-4 months despite eating great.   Given this, plan was made to proceed with cardiac cath for further evaluation.   Hospital Course     On 087/28/2020, Mr. Lisenby underwent a cath which showed continued patency of the stented segment in the proximal/mid LAD and severe de novo stenosis in the mid and distal circumflex. A successful PCI of the mid and distal circumflex was performed. He was noted to have normal systolic function per cath report. He was loaded with clopidogrel on the cath table. He should remain on DAPT with ASA and clopidogrel at least 6 months (stable CAD recommendation).  Consultants: None    The patient was seen and examined by Dr. Burt Knack who feels that he is stable and ready for discharge today, 09/02/2018. Cath site is unremarkable and follow up appointment with Dr. Harrell Gave has been made. He also has follow up with lipid clinic for PCSK-9 inhibitor due to statin intolerance.  _____________  Discharge Vitals Blood pressure 114/73, pulse 60, temperature 97.6 F (36.4 C), temperature source Oral, resp. rate 18, SpO2  99 %.  There were no vitals filed for this visit.  Labs & Radiologic Studies    No results found. Disposition   Pt is being discharged home today in good condition.  Follow-up Plans & Appointments    Follow-up Information    Buford Dresser, MD Follow up on 09/07/2018.   Specialty: Cardiology Why: Your follow up appointment will be on 09/07/2018 at 420pm with Dr. Harrell Gave.  Contact information: [address] [place] [address] [phone]          Discharge Instructions    Amb Referral to Cardiac Rehabilitation   Complete by: As directed    Referring to [facility] CRP 2   Diagnosis: Coronary Stents   After initial evaluation and assessments completed: Virtual Based Care may be provided alone or in conjunction with Phase 2 Cardiac Rehab based on patient barriers.: Yes      Discharge Medications   Allergies as of 09/02/2018      Reactions   Statins Itching   Atorvastatin cause "my skin to feel horrible and unbearable".   Morphine Nausea And Vomiting   "felt like chest was exploding" pt reports      Medication List    STOP taking these medications   esomeprazole 20 MG capsule Commonly known as: Velma these medications   aspirin EC 81 MG tablet Take 1 tablet (81 mg total) by mouth daily.   carvedilol 3.125 MG tablet Commonly known as: COREG Take 1 tablet (3.125 mg total) by mouth 2 (two) times daily with a meal.   clopidogrel 75 MG tablet Commonly known as: Plavix Take 1 tablet (75 mg total) by mouth daily.   FreeStyle Libre 14 Day Sensor Misc Use every 14 days to check sugars daily   gabapentin 100 MG capsule Commonly known as: NEURONTIN Take 1-3 capsules (100-300 mg total) by mouth at bedtime. What changed: how much to take   glipiZIDE 5 MG 24 hr tablet Commonly known as: GLUCOTROL XL Take 5 mg by mouth daily with breakfast.   metFORMIN 1000 MG tablet Commonly known as: GLUCOPHAGE Take 1,000 mg by mouth 2 (two) times daily. Notes to patient: HOLD MEDICATION RESTART ON 8/31 MONDAY   nitroGLYCERIN 0.4 MG SL tablet Commonly known as: NITROSTAT Place 1 tablet (0.4 mg total) under the tongue every 5 (five) minutes as needed for chest pain.   Precision QID Test test strip Generic drug: glucose blood Use twice daily as directed   OneTouch Ultra test strip Generic drug: glucose blood USE TWICE DAILY AS DIRECTED ( E11.65)   pantoprazole 40  MG tablet Commonly known as: PROTONIX Take 1 tablet (40 mg total) by mouth daily.   SAW PALMETTO PO Take 1 capsule by mouth daily.   vitamin B-12 1000 MCG tablet Commonly known as: CYANOCOBALAMIN Take 1,000 mcg by mouth daily.       Acute coronary syndrome (MI, NSTEMI, STEMI, etc) this admission?: Yes.     AHA/ACC Clinical Performance & Quality Measures: 1. Aspirin prescribed? - Yes 2. ADP Receptor Inhibitor (Plavix/Clopidogrel, Brilinta/Ticagrelor or Effient/Prasugrel) prescribed (includes medically managed patients)? - Yes 3. Beta Blocker prescribed? - Yes 4. High Intensity Statin (Lipitor 40-80mg  or Crestor 20-40mg ) prescribed? - No - intolerant  5. EF assessed during THIS hospitalization? - Yes 6. For EF <40%, was ACEI/ARB prescribed? - Not Applicable (EF >/= [id number]) 7. For EF <40%, Aldosterone Antagonist (Spironolactone or Eplerenone) prescribed? - Not Applicable (EF >/= [id number]) 8. Cardiac Rehab Phase  II ordered (Included Medically managed Patients)? - Yes   Outstanding Labs/Studies   None   Duration of Discharge Encounter   Greater than 30 minutes including physician time.  Signed, Kathyrn Drown, NP 09/02/2018, 1:07 PM

## 2018-09-02 NOTE — Discharge Instructions (Signed)
Radial Site Care ° °This sheet gives you information about how to care for yourself after your procedure. Your health care provider may also give you more specific instructions. If you have problems or questions, contact your health care provider. °What can I expect after the procedure? °After the procedure, it is common to have: °· Bruising and tenderness at the catheter insertion area. °Follow these instructions at home: °Medicines °· Take over-the-counter and prescription medicines only as told by your health care provider. °Insertion site care °· Follow instructions from your health care provider about how to take care of your insertion site. Make sure you: °? Wash your hands with soap and water before you change your bandage (dressing). If soap and water are not available, use hand sanitizer. °? Change your dressing as told by your health care provider. °? Leave stitches (sutures), skin glue, or adhesive strips in place. These skin closures may need to stay in place for 2 weeks or longer. If adhesive strip edges start to loosen and curl up, you may trim the loose edges. Do not remove adhesive strips completely unless your health care provider tells you to do that. °· Check your insertion site every day for signs of infection. Check for: °? Redness, swelling, or pain. °? Fluid or blood. °? Pus or a bad smell. °? Warmth. °· Do not take baths, swim, or use a hot tub until your health care provider approves. °· You may shower 24-48 hours after the procedure, or as directed by your health care provider. °? Remove the dressing and gently wash the site with plain soap and water. °? Pat the area dry with a clean towel. °? Do not rub the site. That could cause bleeding. °· Do not apply powder or lotion to the site. °Activity ° °· For 24 hours after the procedure, or as directed by your health care provider: °? Do not flex or bend the affected arm. °? Do not push or pull heavy objects with the affected arm. °? Do not  drive yourself home from the hospital or clinic. You may drive 24 hours after the procedure unless your health care provider tells you not to. °? Do not operate machinery or power tools. °· Do not lift anything that is heavier than 10 lb (4.5 kg), or the limit that you are told, until your health care provider says that it is safe. °· Ask your health care provider when it is okay to: °? Return to work or school. °? Resume usual physical activities or sports. °? Resume sexual activity. °General instructions °· If the catheter site starts to bleed, raise your arm and put firm pressure on the site. If the bleeding does not stop, get help right away. This is a medical emergency. °· If you went home on the same day as your procedure, a responsible adult should be with you for the first 24 hours after you arrive home. °· Keep all follow-up visits as told by your health care provider. This is important. °Contact a health care provider if: °· You have a fever. °· You have redness, swelling, or yellow drainage around your insertion site. °Get help right away if: °· You have unusual pain at the radial site. °· The catheter insertion area swells very fast. °· The insertion area is bleeding, and the bleeding does not stop when you hold steady pressure on the area. °· Your arm or hand becomes pale, cool, tingly, or numb. °These symptoms may represent a serious problem   that is an emergency. Do not wait to see if the symptoms will go away. Get medical help right away. Call your local emergency services (911 in the [place] Do not drive yourself to the hospital. Summary  After the procedure, it is common to have bruising and tenderness at the site.  Follow instructions from your health care provider about how to take care of your radial site wound. Check the wound every day for signs of infection.  Do not lift anything that is heavier than 10 lb (4.5 kg), or the limit that you are told, until your health care provider says  that it is safe. This information is not intended to replace advice given to you by your health care provider. Make sure you discuss any questions you have with your health care provider. Document Released: 01/24/2010 Document Revised: 01/27/2017 Document Reviewed: 01/27/2017 Elsevier Patient Education  [place].   Clopidogrel tablets What is this medicine? CLOPIDOGREL (kloh PID oh grel) helps to prevent blood clots. This medicine is used to prevent heart attack, stroke, or other vascular events in people who are at high risk. This medicine may be used for other purposes; ask your health care provider or pharmacist if you have questions. COMMON BRAND NAME(S): Plavix What should I tell my health care provider before I take this medicine? They need to know if you have any of the following conditions:  bleeding disorders  bleeding in the brain  having surgery  history of stomach bleeding  an unusual or allergic reaction to clopidogrel, other medicines, foods, dyes, or preservatives  pregnant or trying to get pregnant  breast-feeding How should I use this medicine? Take this medicine by mouth with a glass of water. Follow the directions on the prescription label. You may take this medicine with or without food. If it upsets your stomach, take it with food. Take your medicine at regular intervals. Do not take it more often than directed. Do not stop taking except on your doctor's advice. A special MedGuide will be given to you by the pharmacist with each prescription and refill. Be sure to read this information carefully each time. Talk to your pediatrician regarding the use of this medicine in children. Special care may be needed. Overdosage: If you think you have taken too much of this medicine contact a poison control center or emergency room at once. NOTE: This medicine is only for you. Do not share this medicine with others. What if I miss a dose? If you miss a dose, take  it as soon as you can. If it is almost time for your next dose, take only that dose. Do not take double or extra doses. What may interact with this medicine? Do not take this medicine with the following medications:  dasabuvir; ombitasvir; paritaprevir; ritonavir  defibrotide  selexipag This medicine may also interact with the following medications:  certain medicines that treat or prevent blood clots like warfarin  narcotic medicines for pain  NSAIDs, medicines for pain and inflammation, like ibuprofen or naproxen  repaglinide  SNRIs, medicines for depression, like desvenlafaxine, duloxetine, levomilnacipran, venlafaxine  SSRIs, medicines for depression, like citalopram, escitalopram, fluoxetine, fluvoxamine, paroxetine, sertraline  stomach acid blockers like cimetidine, esomeprazole, omeprazole This list may not describe all possible interactions. Give your health care provider a list of all the medicines, herbs, non-prescription drugs, or dietary supplements you use. Also tell them if you smoke, drink alcohol, or use illegal drugs. Some items may interact with your medicine. What  should I watch for while using this medicine? Visit your doctor or health care professional for regular check-ups. Do not stop taking your medicine unless your doctor tells you to. Notify your doctor or health care professional and seek emergency treatment if you develop breathing problems; changes in vision; chest pain; severe, sudden headache; pain, swelling, warmth in the leg; trouble speaking; sudden numbness or weakness of the face, arm or leg. These can be signs that your condition has gotten worse. If you are going to have surgery or dental work, tell your doctor or health care professional that you are taking this medicine. Certain genetic factors may reduce the effect of this medicine. Your doctor may use genetic tests to determine treatment. Only take aspirin if you are instructed to. Low doses of  aspirin are used with this medicine to treat some conditions. Taking aspirin with this medicine can increase your risk of bleeding so you must be careful. Talk to your doctor or pharmacist if you have questions. What side effects may I notice from receiving this medicine? Side effects that you should report to your doctor or health care professional as soon as possible:  allergic reactions like skin rash, itching or hives, swelling of the face, lips, or tongue  signs and symptoms of bleeding such as bloody or black, tarry stools; red or dark-brown urine; spitting up blood or brown material that looks like coffee grounds; red spots on the skin; unusual bruising or bleeding from the eye, gums, or nose  signs and symptoms of a blood clot such as breathing problems; changes in vision; chest pain; severe, sudden headache; pain, swelling, warmth in the leg; trouble speaking; sudden numbness or weakness of the face, arm or leg  signs and symptoms of low blood sugar such as feeling anxious; confusion; dizziness; increased hunger; unusually weak or tired; increased sweating; shakiness; cold, clammy skin; irritable; headache; blurred vision; fast heartbeat; loss of consciousness Side effects that usually do not require medical attention (report to your doctor or health care professional if they continue or are bothersome):  constipation  diarrhea  headache  upset stomach This list may not describe all possible side effects. Call your doctor for medical advice about side effects. You may report side effects to FDA at 1-800-FDA-1088. Where should I keep my medicine? Keep out of the reach of children. Store at room temperature of 59 to 86 degrees F (15 to 30 degrees C). Throw away any unused medicine after the expiration date. NOTE: This sheet is a summary. It may not cover all possible information. If you have questions about this medicine, talk to your doctor, pharmacist, or health care provider.   2020 Elsevier/Gold Standard (2017-05-24 15:03:38)

## 2018-09-02 NOTE — Progress Notes (Signed)
Pharmacy delivered prescriptions and medications (Plavix and Protonix) to patient.

## 2018-09-02 NOTE — Progress Notes (Signed)
Pt ambulated to bathroom, standby assist. Ambulated and voided without difficulty.

## 2018-09-02 NOTE — Progress Notes (Signed)
Client refused assistance to dress and is using right arm when advised not to use right arm

## 2018-09-02 NOTE — Progress Notes (Signed)
Cardiac rehab in to see patient.

## 2018-09-02 NOTE — Progress Notes (Signed)
1100-1150 Discussed with pt the importance of plavix with stent. Reviewed NTG use, carb counting and heart healthy food choices, walking for ex, CRP 2 and smoking cessation. Pt has not been able to quit in the past. Gave smoking cessation handout and encouraged to call 1800quitnow. Do not think at this time pt will be able to quit. 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 Referred to [place] CRP 2. Pt works from from early morning to late night so doubt he will be able to attend. Graylon Good RN BSN 09/02/2018 11:50 AM

## 2018-09-02 NOTE — Interval H&P Note (Signed)
Cath Lab Visit (complete for each Cath Lab visit)  Clinical Evaluation Leading to the Procedure:   ACS: No.  Non-ACS:    Anginal Classification: CCS III  Anti-ischemic medical therapy: Minimal Therapy (1 class of medications)  Non-Invasive Test Results: No non-invasive testing performed  Prior CABG: No previous CABG      History and Physical Interval Note:  09/02/2018 8:06 AM  Dean Singh  has presented today for surgery, with the diagnosis of chest pain.  The various methods of treatment have been discussed with the patient and family. After consideration of risks, benefits and other options for treatment, the patient has consented to  Procedure(s): LEFT HEART CATH AND CORONARY ANGIOGRAPHY (N/A) as a surgical intervention.  The patient's history has been reviewed, patient examined, no change in status, stable for surgery.  I have reviewed the patient's chart and labs.  Questions were answered to the patient's satisfaction.     Sherren Mocha

## 2018-09-05 ENCOUNTER — Encounter (HOSPITAL_COMMUNITY): Payer: Self-pay | Admitting: Cardiovascular Disease

## 2018-09-06 ENCOUNTER — Ambulatory Visit: Payer: Medicare Other | Admitting: Cardiology

## 2018-09-07 ENCOUNTER — Ambulatory Visit: Payer: Medicare Other | Admitting: Cardiology

## 2018-09-08 ENCOUNTER — Other Ambulatory Visit: Payer: Self-pay | Admitting: Family Medicine

## 2018-09-09 ENCOUNTER — Encounter: Payer: Self-pay | Admitting: Cardiology

## 2018-09-13 ENCOUNTER — Ambulatory Visit (INDEPENDENT_AMBULATORY_CARE_PROVIDER_SITE_OTHER): Payer: Medicare Other | Admitting: Cardiology

## 2018-09-13 ENCOUNTER — Encounter: Payer: Self-pay | Admitting: Cardiology

## 2018-09-13 ENCOUNTER — Other Ambulatory Visit: Payer: Self-pay

## 2018-09-13 VITALS — BP 130/62 | HR 78 | Temp 97.4°F | Ht 60.0 in | Wt 152.4 lb

## 2018-09-13 DIAGNOSIS — I2510 Atherosclerotic heart disease of native coronary artery without angina pectoris: Secondary | ICD-10-CM

## 2018-09-13 DIAGNOSIS — E782 Mixed hyperlipidemia: Secondary | ICD-10-CM

## 2018-09-13 DIAGNOSIS — I25118 Atherosclerotic heart disease of native coronary artery with other forms of angina pectoris: Secondary | ICD-10-CM | POA: Diagnosis not present

## 2018-09-13 DIAGNOSIS — R1013 Epigastric pain: Secondary | ICD-10-CM

## 2018-09-13 DIAGNOSIS — I739 Peripheral vascular disease, unspecified: Secondary | ICD-10-CM

## 2018-09-13 DIAGNOSIS — I10 Essential (primary) hypertension: Secondary | ICD-10-CM | POA: Diagnosis not present

## 2018-09-13 DIAGNOSIS — I779 Disorder of arteries and arterioles, unspecified: Secondary | ICD-10-CM

## 2018-09-13 DIAGNOSIS — Z95820 Peripheral vascular angioplasty status with implants and grafts: Secondary | ICD-10-CM

## 2018-09-13 DIAGNOSIS — Z789 Other specified health status: Secondary | ICD-10-CM

## 2018-09-13 NOTE — Patient Instructions (Signed)
Medication Instructions:  Your Physician recommend you continue on your current medication as directed.    If you need a refill on your cardiac medications before your next appointment, please call your pharmacy.   Lab work: None  Testing/Procedures: None  Follow-Up: At [facility], you and your health needs are our priority.  As part of our continuing mission to provide you with exceptional heart care, we have created designated Provider Care Teams.  These Care Teams include your primary Cardiologist (physician) and Advanced Practice Providers (APPs -  Physician Assistants and Nurse Practitioners) who all work together to provide you with the care you need, when you need it. You will need a follow up appointment in 3 months.  Please call our office 2 months in advance to schedule this appointment.  You may see Buford Dresser, MD or one of the following Advanced Practice Providers on your designated Care Team:   Rosaria Ferries, PA-C . Jory Sims, DNP, ANP  Your physician recommends that you follow up with Lipid Clinic on 10/05/18 at 2 pm.

## 2018-09-13 NOTE — Progress Notes (Signed)
Cardiology Office Note:    Date:  09/13/2018   ID:  Dean Singh, Dean Singh 18-Jul-1960, MRN [id number]  PCP:  Dean Lukes, MD  Cardiologist:  Dean Dresser, MD PhD  Referring MD: Dean Lukes, MD   CC: follow up post cath  History of Present Illness:    Dean Singh is a 58 y.o. male with a hx of CAD with 2 prior stents, CVA, tobacco abuse, hyperlipidemia, uncontrolled type II diabetes. Pertinent non-CV medical history includes bladder cancer, severe peripheral neuropathy, DDD, GERD. My initial visit with him was on 08/23/18.  Updates since last visit: After initial visit 08/23/18, sent for cath with Dr. Burt Knack. Cath 09/02/18 showed: 1.  Left dominant coronary system with continued patency of the stented segment in the proximal/mid LAD and severe de novo stenosis in the mid and distal circumflex 2.  Successful PCI of the mid and distal circumflex using a 2.5 x 30 mm resolute Onyx DES in the distal vessel and a 3.0 x 26 mm resolute Onyx DES in the mid vessel 3.  Normal LV systolic function with normal LVEDP.  Plan: same day PCI, Pt loaded with clopidogrel on the table. Should remain on DAPT with ASA and clopidogrel at least 6 months (stable CAD recommendation).  He is accompanied today by his sister Dean Singh today for follow up.  Since cath, one time he has had sharp pain in the throat, but none of the severe throat/neck pain he was having before the cath/stents. Still having central pain in his chest. Happening about once/day, no change since the stent. Usually morning to mid-day. No clear triggers. Not more or less frequent. No aggravating/alleviating factors. 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 Not a palpitation feeling, not like prior bubble/butterflies sensation. Kind of dull. Nonradiating. Notes that he has history of GERD, has not felt that current PPI (pantoprazole) works as well as his  prior.  Reviewed personal and family history again today. Notes that his father has something that makes cholesterol extra sticky.   Prior carotid disease s/p R CEA.  Still smoking, does not plan to quit at this time.  As per prior note, broke out in hives and had joint swelling on statins. Can remember being on atorvastatin and having this reaction for sure, unsure of other statins tried.  Denies shortness of breath at rest or with normal exertion. No PND, orthopnea, LE edema or unexpected weight gain. No syncope or palpitations.  Past Medical History:  Diagnosis Date  . Arthritis    DDD  . Bladder cancer ([place])   . Bladder cancer ([place]) 08/17/2018  . CAD in native artery 08/17/2018  . Current nicotine use 08/17/2018  . Diabetes mellitus without complication ([place])   . GERD (gastroesophageal reflux disease)   . H/O: stroke   . Heart attack ([place])    2 stents  . Hx of hemorrhoids 08/17/2018  . Hyperlipidemia   . Hypertension   . Peripheral neuropathy 08/17/2018  . Stroke ([place]) 57  . Visual changes 08/17/2018    Past Surgical History:  Procedure Laterality Date  . BLADDER TUMOR EXCISION     cancer  . CAROTID ENDARTERECTOMY Left   . CORONARY ANGIOPLASTY WITH STENT PLACEMENT     2 stent  . CORONARY STENT INTERVENTION N/A 09/02/2018   Procedure: CORONARY STENT INTERVENTION;  Surgeon: Sherren Mocha, MD;  Location: [facility];  Service:  Cardiovascular;  Laterality: N/A;  . HERNIA REPAIR     right inguinal 2017  . LEFT HEART CATH AND CORONARY ANGIOGRAPHY N/A 09/02/2018   Procedure: LEFT HEART CATH AND CORONARY ANGIOGRAPHY;  Surgeon: Sherren Mocha, MD;  Location: [facility];  Service: Cardiovascular;  Laterality: N/A;    Current Medications: Current Outpatient Medications on File Prior to Visit  Medication Sig  . aspirin EC 81 MG tablet Take 1 tablet (81 mg total) by mouth daily.  . carvedilol (COREG) 3.125 MG tablet Take 1 tablet (3.125 mg total) by mouth 2 (two)  times daily with a meal.  . clopidogrel (PLAVIX) 75 MG tablet Take 1 tablet (75 mg total) by mouth daily.  . Continuous Blood Gluc Sensor (FREESTYLE LIBRE 14 DAY SENSOR) MISC Use every 14 days to check sugars daily  . gabapentin (NEURONTIN) 100 MG capsule TAKE 1-3 CAPSULES BY MOUTH AT BEDTIME.  [name] glipiZIDE (GLUCOTROL XL) 5 MG 24 hr tablet Take 5 mg by mouth daily with breakfast.   . glucose blood (ONETOUCH ULTRA) test strip USE TWICE DAILY AS DIRECTED ( E11.65)  . glucose blood (PRECISION QID TEST) test strip Use twice daily as directed  . metFORMIN (GLUCOPHAGE) 1000 MG tablet Take 1,000 mg by mouth 2 (two) times daily.   . nitroGLYCERIN (NITROSTAT) 0.4 MG SL tablet Place 1 tablet (0.4 mg total) under the tongue every 5 (five) minutes as needed for chest pain.  . pantoprazole (PROTONIX) 40 MG tablet Take 1 tablet (40 mg total) by mouth daily.  . Saw Palmetto, Serenoa repens, (SAW PALMETTO PO) Take 1 capsule by mouth daily.  . vitamin B-12 (CYANOCOBALAMIN) 1000 MCG tablet Take 1,000 mcg by mouth daily.   No current facility-administered medications on file prior to visit.      Allergies:   Statins and Morphine   Social History   Socioeconomic History  . Marital status: Married    Spouse name: Not on file  . Number of children: Not on file  . Years of education: Not on file  . Highest education level: Not on file  Occupational History  . Not on file  Social Needs  . Financial resource strain: Not on file  . Food insecurity    Worry: Not on file    Inability: Not on file  . Transportation needs    Medical: Not on file    Non-medical: Not on file  Tobacco Use  . Smoking status: Current Every Day Smoker  . Smokeless tobacco: Current User  Substance and Sexual Activity  . Alcohol use: Not Currently  . Drug use: Not Currently  . Sexual activity: Yes  Lifestyle  . Physical activity    Days per week: Not on file    Minutes per session: Not on file  . Stress: Not on file   Relationships  . Social Herbalist on phone: Not on file    Gets together: Not on file    Attends religious service: Not on file    Active member of club or organization: Not on file    Attends meetings of clubs or organizations: Not on file    Relationship status: Not on file  Other Topics Concern  . 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     Family History: The patient's family history includes Addison's  disease in his sister; Arthritis in his father and mother; COPD in his mother; Dementia in his maternal grandfather, maternal grandmother, and mother; Diabetes in his brother, father, maternal grandfather, maternal grandmother, mother, and sister; Heart disease in his father, maternal grandfather, maternal grandmother, and mother; Hyperlipidemia in his brother, father, maternal grandfather, maternal grandmother, and sister; Hypertension in his brother and sister; Obesity in his brother, brother, and sister; Osteoporosis in his father and mother; Other in his paternal grandmother; Parkinson's disease in his father; Prostate cancer in his paternal grandfather; Rheumatic fever in his son; Stickler syndrome in his son.  ROS:   Please see the history of present illness.  Additional pertinent ROS: Constitutional: Negative for chills, fever, night sweats. Has prior unintentional weight loss, no change from prior HENT: Negative for ear pain and hearing loss.   Eyes: Negative for loss of vision and eye pain.  Respiratory: Negative for cough, sputum, wheezing.   Cardiovascular: See HPI. Gastrointestinal: Negative for abdominal pain, melena, and hematochezia.  Genitourinary: Negative for dysuria and hematuria.  Musculoskeletal: Negative for falls and myalgias.  Skin: Negative for itching and rash.  Neurological: Negative for focal  weakness, focal sensory changes and loss of consciousness.  Endo/Heme/Allergies: Does bruise/bleed easily.    EKGs/Labs/Other Studies Reviewed:    The following studies were reviewed today: Cath as above  EKG:  EKG is personally reviewed.  The ekg ordered today demonstrates NSR  Recent Labs: 08/23/2018: ALT 6; BUN 16; Creatinine, Ser 0.91; Hemoglobin 16.7; Platelets 290; Potassium 5.0; Sodium 135  Recent Lipid Panel    Component Value Date/Time   CHOL 218 (H) 08/23/2018 1148   TRIG 254 (H) 08/23/2018 1148   HDL 29 (L) 08/23/2018 1148   CHOLHDL 7.5 (H) 08/23/2018 1148   LDLCALC 138 (H) 08/23/2018 1148    Physical Exam:    VS:  BP 130/62   Pulse 78   Temp (!) 97.4 F (36.3 C)   Ht 5' (1.524 m)   Wt 152 lb 6.4 oz (69.1 kg)   SpO2 96%   BMI 29.76 kg/m     Wt Readings from Last 3 Encounters:  09/13/18 152 lb 6.4 oz (69.1 kg)  08/23/18 150 lb 9.6 oz (68.3 kg)  08/16/18 150 lb (68 kg)    GEN: Well nourished, well developed in no acute distress HEENT: Normal, moist mucous membranes NECK: No JVD. Well healed R CEA scar CARDIAC: regular rhythm, normal S1 and S2, no murmurs, rubs, gallops.  VASCULAR: Radial and DP pulses 2+ bilaterally. Very faint carotid bruits appreciated today. Right radial cath site c/d/i, resolving mild bruising. Resolving bruising in left forearm at site of PIV. RESPIRATORY:  Clear to auscultation without rales, wheezing or rhonchi  ABDOMEN: Soft, non-distended. Tender to deep palpation in midepigastric area MUSCULOSKELETAL:  Ambulates independently SKIN: Warm and dry, no edema NEUROLOGIC:  Alert and oriented x 3. No focal neuro deficits noted. PSYCHIATRIC:  Normal affect    ASSESSMENT:    1. Epigastric pain   2. Coronary artery disease involving native coronary artery of native heart with other form of angina pectoris ([place])   3. Status post angioplasty with stent   4. Essential hypertension   5. Mixed hyperlipidemia   6. Statin intolerance   7.  Bilateral carotid artery disease, unspecified type ([place])    PLAN:    Epigastric pain: tender to palpation in epigastric area. As no residual untreated disease on cath, nonexertional, not like prior anginal symptoms, suspect this is not cardiac in  etiology. May be GERD given history, but with prior unintentional weight loss and smoking history, cannot exclude other causes -recommend discussion with PCP to determine additional workup.  History of CAD, with recent angina and now s/p 2 additional stents 08/2018 -continue aspirin 81 mg -continue clopidogrel for at least 6 mos post PCI -intolerant of statins, see below -on carvedilol 3.125 mg BID -has had less of his typical angina, but continues with epigastric pain, as above  Hypercholesterolemia: LDL goal <70. Checked lipids 08/2018, LDL 138, TG 254 (not fasting), but may also have component of hypertriglyceridemia -intolerant of atorvastatin at a minimum, had whole body hives and joint pain -unsure if he has been on other statins, but with hives concern for true allergy -will refer for PCSK9i therapy discussion, scheduled with PharmD clinic 10/05/18  Hypertension: at goal, 130/62 today -continue carvedilol  Carotid stenosis s/p R CEA: faint bilateral bruits today, asymptomatic -lipid control, tobacco cessation, aspirin as above for ASCVD  Tobacco abuse: The patient was counseled on tobacco cessation today for 3 minutes.  Counseling included reviewing the risks of smoking tobacco products, how it impacts the patient's current medical diagnoses and different strategies for quitting.  Pharmacotherapy to aid in tobacco cessation was not prescribed today. Does not plan to stop at this time.  Cardiac risk counseling and prevention recommendations: -recommend heart healthy/Mediterranean diet, with whole grains, fruits, vegetable, fish, lean meats, nuts, and olive oil. Limit salt. -recommend moderate walking, 3-5 times/week for 30-50 minutes each  session. Aim for at least 150 minutes.week. Goal should be pace of 3 miles/hours, or walking 1.5 miles in 30 minutes -recommend avoidance of tobacco products. Avoid excess alcohol. -Additional risk factor control:  -Diabetes: A1c is 9.3, poorly controlled on glipizide and metformin. Would benefit from SGLT2i or GLP1 RA given CAD history. Will defer to Dr. Charlett Blake.  Plan for follow up: several weeks with PharmD, then me in 3 mos. Will call if symptoms worsen  Medication Adjustments/Labs and Tests Ordered: Current medicines are reviewed at length with the patient today.  Concerns regarding medicines are outlined above.  Orders Placed This Encounter  Procedures  . EKG 12-Lead   No orders of the defined types were placed in this encounter.   Patient Instructions  Medication Instructions:  Your Physician recommend you continue on your current medication as directed.    If you need a refill on your cardiac medications before your next appointment, please call your pharmacy.   Lab work: None  Testing/Procedures: None  Follow-Up: At [facility], you and your health needs are our priority.  As part of our continuing mission to provide you with exceptional heart care, we have created designated Provider Care Teams.  These Care Teams include your primary Cardiologist (physician) and Advanced Practice Providers (APPs -  Physician Assistants and Nurse Practitioners) who all work together to provide you with the care you need, when you need it. You will need a follow up appointment in 3 months.  Please call our office 2 months in advance to schedule this appointment.  You may see Dean Dresser, MD or one of the following Advanced Practice Providers on your designated Care Team:   Rosaria Ferries, PA-C . Jory Sims, DNP, ANP  Your physician recommends that you follow up with Lipid Clinic on 10/05/18 at 2 pm.       Signed, Dean Dresser, MD PhD 09/13/2018 2:58 PM    [place]

## 2018-10-05 ENCOUNTER — Ambulatory Visit: Payer: Medicare Other

## 2018-10-06 ENCOUNTER — Ambulatory Visit: Payer: Medicare Other

## 2018-10-10 ENCOUNTER — Other Ambulatory Visit: Payer: Self-pay | Admitting: Family Medicine

## 2018-10-10 ENCOUNTER — Other Ambulatory Visit: Payer: Self-pay | Admitting: Cardiovascular Disease

## 2018-10-11 ENCOUNTER — Other Ambulatory Visit: Payer: Self-pay

## 2018-10-11 ENCOUNTER — Ambulatory Visit (INDEPENDENT_AMBULATORY_CARE_PROVIDER_SITE_OTHER): Payer: Medicare Other | Admitting: Pharmacist Clinician (PhC)/ Clinical Pharmacy Specialist

## 2018-10-11 DIAGNOSIS — E782 Mixed hyperlipidemia: Secondary | ICD-10-CM | POA: Diagnosis not present

## 2018-10-11 DIAGNOSIS — I2510 Atherosclerotic heart disease of native coronary artery without angina pectoris: Secondary | ICD-10-CM

## 2018-10-11 MED ORDER — EZETIMIBE 10 MG PO TABS: 10.0000 mg | ORAL_TABLET | Freq: Every day | ORAL | 3 refills | Status: DC

## 2018-10-11 MED ORDER — ROSUVASTATIN CALCIUM 5 MG PO TABS: 5.0000 mg | ORAL_TABLET | ORAL | 3 refills | Status: DC

## 2018-10-11 NOTE — Patient Instructions (Signed)
Start ezetimibe 10 mg once daily.  After 2 weeks if you are doing well, start the rosuvastatin.  It will be the lowest dose (5 mg) and take just 1 tablet every 7 days.  Let us know if you have any problems with either of these.  If all goes well please call after about 4 doses of rosuvastatin, so we can repeat cholesterol labs.    [name]/[name] - [phone]

## 2018-10-11 NOTE — Progress Notes (Signed)
10/12/2018 Saad L. Luciana Axe 12-03-1960 [id number]   HPI:  Dean Singh is a 58 y.o. male patient of Dr Harrell Gave, with a [place] below who presents today for hyperlipidemia clinic evaluation.  See pertinent medical history below.    Dean Singh saw Dr. Harrell Gave in August on referral from PCP for chest pain.  He reported it to be similar in nature to pain associated with prior MI.  He was scheduled for a cardiac catheterization and had 2 stents placed in mid and distal circumflex.    After his first PCI he was started on atorvastatin, however developed rash/swelling after just 1 dose.  Medication was stopped, although patient notes that rash persisted for most of a month.  He has not re-challenged with any other statin drugs.  He does note that he previously tried fenofibrate and did well, would prefer to go back on that.   Past Medical History: CAD DES x 2 to mid/distal circumflex  DM2 A1c 9.3 (08/2018) on glipizide 5, metformin 1 gm bid  hypertension Carvedilol 3.125 mg bid  CVA Nov 2019; no deficits        Blood Pressure Goal:  130/80  Current Medications: none  Family Hx:  Older brother with DM, no knowledge of any ASCVD  2 sons - no known health issues  Social Hx: 1-1.5 ppd; no interest in quitting currently, states work is high stress, smoking since 58 yrs old; no alcohol, no caffeine (rare Dr. 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  No f/v except  bananas  Exercise: running around all day, mows lawns, (both riding and push); hunts - 1 plus mile to deer stand  Intolerances:  Atorvastatin caused rash/hives  Labs:  09/2018: TC 218, TG 254, HDL 29, LDL 138  Wt Readings from Last 3 Encounters:  09/13/18 152 lb 6.4 oz (69.1 kg)  08/23/18 150 lb 9.6 oz (68.3 kg)  08/16/18 150 lb (68 kg)   BP Readings from Last 3 Encounters:  09/13/18 130/62  09/02/18  112/63  08/23/18 124/62   Pulse Readings from Last 3 Encounters:  09/13/18 78  09/02/18 62  08/23/18 77    Current Outpatient Medications  Medication Sig Dispense Refill  . aspirin EC 81 MG tablet Take 1 tablet (81 mg total) by mouth daily.    . carvedilol (COREG) 3.125 MG tablet TAKE 1 TABLET (3.125 MG TOTAL) BY MOUTH 2 (TWO) TIMES DAILY WITH A MEAL. 180 tablet 1  . clopidogrel (PLAVIX) 75 MG tablet TAKE 1 TABLET BY MOUTH EVERY DAY 30 tablet 0  . Continuous Blood Gluc Sensor (FREESTYLE LIBRE 14 DAY SENSOR) MISC Use every 14 days to check sugars daily    . ezetimibe (ZETIA) 10 MG tablet Take 1 tablet (10 mg total) by mouth daily. 90 tablet 3  . gabapentin (NEURONTIN) 100 MG capsule TAKE 1-3 CAPSULES BY MOUTH AT BEDTIME. 270 capsule 1  . glipiZIDE (GLUCOTROL XL) 5 MG 24 hr tablet Take 5 mg by mouth daily with breakfast.     . glucose blood (ONETOUCH ULTRA) test strip USE TWICE DAILY AS DIRECTED ( E11.65)    . glucose blood (PRECISION QID TEST) test strip Use twice daily as directed    . metFORMIN (GLUCOPHAGE) 1000 MG tablet Take 1,000 mg by mouth 2 (two) times daily.     . nitroGLYCERIN (NITROSTAT) 0.4  MG SL tablet Place 1 tablet (0.4 mg total) under the tongue every 5 (five) minutes as needed for chest pain. 50 tablet 3  . pantoprazole (PROTONIX) 40 MG tablet Take 1 tablet (40 mg total) by mouth daily. 30 tablet 2  . rosuvastatin (CRESTOR) 5 MG tablet Take 1 tablet (5 mg total) by mouth once a week. 12 tablet 3  . Saw Palmetto, Serenoa repens, (SAW PALMETTO PO) Take 1 capsule by mouth daily.    . vitamin B-12 (CYANOCOBALAMIN) 1000 MCG tablet Take 1,000 mcg by mouth daily.     No current facility-administered medications for this visit.     Allergies  Allergen Reactions  . Statins Itching    Atorvastatin cause "my skin to feel horrible and unbearable".  . Morphine Nausea And Vomiting    "felt like chest was exploding" pt reports     Past Medical History:  Diagnosis Date  .  Arthritis    DDD  . Bladder cancer ([place])   . Bladder cancer ([place]) 08/17/2018  . CAD in native artery 08/17/2018  . Current nicotine use 08/17/2018  . Diabetes mellitus without complication ([place])   . GERD (gastroesophageal reflux disease)   . H/O: stroke   . Heart attack ([place])    2 stents  . Hx of hemorrhoids 08/17/2018  . Hyperlipidemia   . Hypertension   . Peripheral neuropathy 08/17/2018  . Stroke ([place]) 57  . Visual changes 08/17/2018    There were no vitals taken for this visit.  Hyperlipidemia Patient with ASCVD and hyperlipidemia.  Would be good candidate for PCSK-9 inhibitor. However patient is on Medicaid, and they require 30 days trial on both atorvastatin and rosuvastatin (high dose) before consideration of Repatha.  Reviewed other options with patient.  Explained that fenofibrate is strictly to help with triglycerides - only have minimal effect on LDL.  Today he will start with ezetimibe 10 mg daily and after 2 weeks, if he is tolerating it well, will start on rosuvastatin 5 mg once weekly.  He is to contact us after 4 weeks of the rosuvastatin and we can draw labs to see how much benefit he gets.  At that time we can hopefully start to titrate the rosuvastatin dose upward for maximal benefit.     Tommy Medal PharmD CPP [facility] [address] [place], [place] [address] [phone]

## 2018-10-12 ENCOUNTER — Encounter: Payer: Self-pay | Admitting: Pharmacist Clinician (PhC)/ Clinical Pharmacy Specialist

## 2018-10-12 NOTE — Assessment & Plan Note (Signed)
Patient with ASCVD and hyperlipidemia.  Would be good candidate for PCSK-9 inhibitor. However patient is on Medicaid, and they require 30 days trial on both atorvastatin and rosuvastatin (high dose) before consideration of Repatha.  Reviewed other options with patient.  Explained that fenofibrate is strictly to help with triglycerides - only have minimal effect on LDL.  Today he will start with ezetimibe 10 mg daily and after 2 weeks, if he is tolerating it well, will start on rosuvastatin 5 mg once weekly.  He is to contact us after 4 weeks of the rosuvastatin and we can draw labs to see how much benefit he gets.  At that time we can hopefully start to titrate the rosuvastatin dose upward for maximal benefit.

## 2018-11-09 ENCOUNTER — Other Ambulatory Visit: Payer: Self-pay | Admitting: Cardiology

## 2018-11-24 ENCOUNTER — Telehealth: Payer: Self-pay

## 2018-11-24 DIAGNOSIS — E782 Mixed hyperlipidemia: Secondary | ICD-10-CM

## 2018-11-24 NOTE — Telephone Encounter (Signed)
Called and spoke w/pts wife regarding if the pt was tolerating zetia or rosuvastatin. The pt's wife indicated that he has only taken one of the once weekly doses of the rosuvastatin and that the zetia so far has been going great. Instructed the pt's spouse to have pt complete fasting lipid labs on 12/22/18 when they're already here to see Dr. Harrell Gave. They voiced understanding.

## 2018-12-12 ENCOUNTER — Other Ambulatory Visit: Payer: Self-pay | Admitting: Cardiology

## 2018-12-22 ENCOUNTER — Telehealth (INDEPENDENT_AMBULATORY_CARE_PROVIDER_SITE_OTHER): Payer: Medicare Other | Admitting: Cardiology

## 2018-12-22 ENCOUNTER — Encounter: Payer: Self-pay | Admitting: Cardiology

## 2018-12-22 VITALS — BP 155/94 | HR 76

## 2018-12-22 DIAGNOSIS — Z720 Tobacco use: Secondary | ICD-10-CM

## 2018-12-22 DIAGNOSIS — I10 Essential (primary) hypertension: Secondary | ICD-10-CM

## 2018-12-22 DIAGNOSIS — I2510 Atherosclerotic heart disease of native coronary artery without angina pectoris: Secondary | ICD-10-CM | POA: Diagnosis not present

## 2018-12-22 DIAGNOSIS — E782 Mixed hyperlipidemia: Secondary | ICD-10-CM | POA: Diagnosis not present

## 2018-12-22 NOTE — Patient Instructions (Signed)
Medication Instructions:  Your Physician recommend you continue on your current medication as directed.    *If you need a refill on your cardiac medications before your next appointment, please call your pharmacy*  Lab Work: None  Testing/Procedures: None  Follow-Up: At [facility], you and your health needs are our priority.  As part of our continuing mission to provide you with exceptional heart care, we have created designated Provider Care Teams.  These Care Teams include your primary Cardiologist (physician) and Advanced Practice Providers (APPs -  Physician Assistants and Nurse Practitioners) who all work together to provide you with the care you need, when you need it.  Your next appointment:   3-4 week(s)  The format for your next appointment:   Virtual Visit   Provider:   Buford Dresser, MD
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
## 2019-01-07 ENCOUNTER — Encounter: Payer: Self-pay | Admitting: Family Medicine

## 2019-01-12 ENCOUNTER — Telehealth (INDEPENDENT_AMBULATORY_CARE_PROVIDER_SITE_OTHER): Payer: Medicare Other | Admitting: Cardiology

## 2019-01-12 ENCOUNTER — Encounter: Payer: Self-pay | Admitting: Cardiology

## 2019-01-12 VITALS — BP 159/89 | HR 78 | Ht 63.0 in | Wt 152.0 lb

## 2019-01-12 DIAGNOSIS — Z720 Tobacco use: Secondary | ICD-10-CM | POA: Diagnosis not present

## 2019-01-12 DIAGNOSIS — Z95820 Peripheral vascular angioplasty status with implants and grafts: Secondary | ICD-10-CM | POA: Diagnosis not present

## 2019-01-12 DIAGNOSIS — I25118 Atherosclerotic heart disease of native coronary artery with other forms of angina pectoris: Secondary | ICD-10-CM

## 2019-01-12 DIAGNOSIS — I10 Essential (primary) hypertension: Secondary | ICD-10-CM

## 2019-01-12 DIAGNOSIS — Z79899 Other long term (current) drug therapy: Secondary | ICD-10-CM

## 2019-01-12 MED ORDER — CARVEDILOL 6.25 MG PO TABS: 6.2500 mg | ORAL_TABLET | Freq: Two times a day (BID) | ORAL | 3 refills | Status: DC

## 2019-01-12 MED ORDER — ROSUVASTATIN CALCIUM 5 MG PO TABS: 5.0000 mg | ORAL_TABLET | ORAL | 3 refills | Status: DC

## 2019-01-12 NOTE — Patient Instructions (Signed)
Medication Instructions:  Increase Crestor 5 mg to two times a week. Increase Carvedilol to 6.25 mg twice a week.  *If you need a refill on your cardiac medications before your next appointment, please call your pharmacy*  Lab Work: None  Testing/Procedures: None  Follow-Up: At [facility], you and your health needs are our priority.  As part of our continuing mission to provide you with exceptional heart care, we have created designated Provider Care Teams.  These Care Teams include your primary Cardiologist (physician) and Advanced Practice Providers (APPs -  Physician Assistants and Nurse Practitioners) who all work together to provide you with the care you need, when you need it.  Your next appointment:   1 month(s)  The format for your next appointment:   Virtual Visit   Provider:   Buford Dresser, MD

## 2019-01-12 NOTE — Progress Notes (Signed)
Virtual Visit via Video Note   This visit type was conducted due to national recommendations for restrictions regarding the COVID-19 Pandemic (e.g. social distancing) in an effort to limit this patient's exposure and mitigate transmission in our community.  Due to his co-morbid illnesses, this patient is at least at moderate risk for complications without adequate follow up.  This format is felt to be most appropriate for this patient at this time.  All issues noted in this document were discussed and addressed.  A limited physical exam was performed with this format.  Please refer to the patient's chart for his consent to telehealth for [facility].   Date:  01/12/2019   ID:  Dean Singh, Dean Singh 1960-06-22, MRN [id number]  Patient Location: Home Provider Location: Office  PCP:  Mosie Lukes, MD  Cardiologist:  Buford Dresser, MD  Electrophysiologist:  None   Evaluation Performed:  Follow-Up Visit  Chief Complaint:  Follow up  History of Present Illness:    Dean Singh is a 59 y.o. male with a hx of CAD with 2 prior stents, CVA, tobacco abuse, hyperlipidemia, uncontrolled type II diabetes.Pertinent non-CV medical history includes bladder cancer, severe peripheral neuropathy, DDD, GERD.My initial visit with him was on 08/23/18.  After initial visit 08/23/18, sent for cath with Dr. Burt Knack. Cath 09/02/18 showed: 1. Left dominant coronary system with continued patency of the stented segment in the proximal/mid LAD and severe de novo stenosis in the mid and distal circumflex 2. Successful PCI of the mid and distal circumflex using a 2.5 x 30 mm resolute Onyx DES in the distal vessel and a 3.0 x 26 mm resolute Onyx DES in the mid vessel 3. Normal LV systolic function with normal LVEDP.  Plan: same day PCI, Pt loaded with clopidogrel on the table. Should remain on DAPT with ASA and clopidogrel at least 6 months (stable CAD recommendation).  Seen by Tommy Medal in  lipid clinic 10/11/18. Discussed cholesterol options  Patient with ASCVD and hyperlipidemia. Would be good candidate for PCSK-9 inhibitor. However patient is on Medicaid, and they require 30 days trial on both atorvastatin and rosuvastatin (high dose) before consideration of Repatha. Reviewed other options with patient. Explained that fenofibrate is strictly to help with triglycerides - only have minimal effect on LDL. Today he will start with ezetimibe 10 mg daily and after 2 weeks, if he is tolerating it well, will start on rosuvastatin 5 mg once weekly. He is to contact us after 4 weeks of the rosuvastatin and we can draw labs to see how much benefit he gets. At that time we can hopefully start to titrate the rosuvastatin dose upward for maximal benefit.  The patient does not have symptoms concerning for COVID-19 infection (fever, chills, cough, or new shortness of breath).   Today:  He is having occasional chest pain, more with stress. At least 1-2 times/week. Lasts seconds. Sharp, burning. No clear aggravating or alleviating factors. No radiation, no associated symptoms. One severe episode last week, took nitro which helped.  Tolerating rosuvastatin 1x/week. Will increase to 2x/week today. He hasnt been able to get bloodwork yet.  Very stressed with work, life, [employer] research [employer], [employer]. Job will be changing/shutting down in upcoming weeks, which has him extremely stressed. Smoking is the same, has not been able to cut back or quit since last visit.  Takes occasional blood pressures. Two most recent numbers show bp 143/85 hr 64, 159/89 hr 78. We discussed that goal is <130/80, and he remains  above this. He does not want to add a new medication at this time, but he is amenable to increasing coreg.  Denies shortness of breath at rest or with normal exertion. No PND, orthopnea, LE edema or unexpected weight gain. No syncope or palpitations.   Past Medical History:  Diagnosis Date  . Arthritis    DDD  .  Bladder cancer ([place])   . Bladder cancer ([place]) 08/17/2018  . CAD in native artery 08/17/2018  . Current nicotine use 08/17/2018  . Diabetes mellitus without complication ([place])   . GERD (gastroesophageal reflux disease)   . H/O: stroke   . Heart attack ([place])    2 stents  . Hx of hemorrhoids 08/17/2018  . Hyperlipidemia   . Hypertension   . Peripheral neuropathy 08/17/2018  . Stroke ([place]) 57  . Visual changes 08/17/2018   Past Surgical History:  Procedure Laterality Date  . BLADDER TUMOR EXCISION     cancer  . CAROTID ENDARTERECTOMY Left   . CORONARY ANGIOPLASTY WITH STENT PLACEMENT     2 stent  . CORONARY STENT INTERVENTION N/A 09/02/2018   Procedure: CORONARY STENT INTERVENTION;  Surgeon: Sherren Mocha, MD;  Location: [place] [facility];  Service: Cardiovascular;  Laterality: N/A;  . HERNIA REPAIR     right inguinal 2017  . LEFT HEART CATH AND CORONARY ANGIOGRAPHY N/A 09/02/2018   Procedure: LEFT HEART CATH AND CORONARY ANGIOGRAPHY;  Surgeon: Sherren Mocha, MD;  Location: [place] [facility];  Service: Cardiovascular;  Laterality: N/A;     Current Meds  Medication Sig  . aspirin EC 81 MG tablet Take 1 tablet (81 mg total) by mouth daily.  . carvedilol (COREG) 3.125 MG tablet TAKE 1 TABLET (3.125 MG TOTAL) BY MOUTH 2 (TWO) TIMES DAILY WITH A MEAL.  [name] clopidogrel (PLAVIX) 75 MG tablet TAKE 1 TABLET BY MOUTH EVERY DAY  . Continuous Blood Gluc Sensor (FREESTYLE LIBRE 14 DAY SENSOR) MISC Use every 14 days to check sugars daily  . ezetimibe (ZETIA) 10 MG tablet Take 1 tablet (10 mg total) by mouth daily.  [name] gabapentin (NEURONTIN) 100 MG capsule TAKE 1-3 CAPSULES BY MOUTH AT BEDTIME.  [name] glipiZIDE (GLUCOTROL XL) 5 MG 24 hr tablet Take 5 mg by mouth daily with breakfast.   . glucose blood (ONETOUCH ULTRA) test strip USE TWICE DAILY AS DIRECTED ( E11.65)  . glucose blood (PRECISION QID TEST) test strip Use twice daily as directed  . metFORMIN (GLUCOPHAGE) 1000 MG tablet Take 1,000 mg by  mouth 2 (two) times daily.   . nitroGLYCERIN (NITROSTAT) 0.4 MG SL tablet Place 1 tablet (0.4 mg total) under the tongue every 5 (five) minutes as needed for chest pain.  . pantoprazole (PROTONIX) 40 MG tablet Take 1 tablet (40 mg total) by mouth daily.  . rosuvastatin (CRESTOR) 5 MG tablet Take 1 tablet (5 mg total) by mouth once a week.  . Saw Palmetto, Serenoa repens, (SAW PALMETTO PO) Take 1 capsule by mouth daily as needed (When having prostate problems).      Allergies:   Statins and Morphine   Social History   Tobacco Use  . Smoking status: Current Every Day Smoker  . Smokeless tobacco: Current User  Substance Use Topics  . Alcohol use: Not Currently  . Drug use: Not Currently     Family Hx: The patient's family history includes Addison's disease in his sister; Arthritis in his father and mother; COPD in his mother; Dementia in his maternal grandfather, maternal grandmother,  and mother; Diabetes in his brother, father, maternal grandfather, maternal grandmother, mother, and sister; Heart disease in his father, maternal grandfather, maternal grandmother, and mother; Hyperlipidemia in his brother, father, maternal grandfather, maternal grandmother, and sister; Hypertension in his brother and sister; Obesity in his brother, brother, and sister; Osteoporosis in his father and mother; Other in his paternal grandmother; Parkinson's disease in his father; Prostate cancer in his paternal grandfather; Rheumatic fever in his son; Stickler syndrome in his son.  ROS:   Please see the history of present illness.    All other systems reviewed and are negative.   Prior CV studies:   The following studies were reviewed today: No new since most recent visit  Labs/Other Tests and Data Reviewed:    EKG:  An ECG dated 09/13/18 was personally reviewed today and demonstrated:  NSR  Recent Labs: 08/23/2018: ALT 6; BUN 16; Creatinine, Ser 0.91; Hemoglobin 16.7; Platelets 290; Potassium 5.0; Sodium  135   Recent Lipid Panel Lab Results  Component Value Date/Time   CHOL 218 (H) 08/23/2018 11:48 AM   TRIG 254 (H) 08/23/2018 11:48 AM   HDL 29 (L) 08/23/2018 11:48 AM   CHOLHDL 7.5 (H) 08/23/2018 11:48 AM   LDLCALC 138 (H) 08/23/2018 11:48 AM    Wt Readings from Last 3 Encounters:  01/12/19 152 lb (68.9 kg)  09/13/18 152 lb 6.4 oz (69.1 kg)  08/23/18 150 lb 9.6 oz (68.3 kg)     Objective:    Vital Signs:  BP (!) 159/89 Comment: taken last night by pt  Pulse 78   Ht 5\' 3"  (1.6 m)   Wt 152 lb (68.9 kg)   BMI 26.93 kg/m    VITAL SIGNS:  reviewed GEN:  no acute distress EYES:  sclerae anicteric, EOMI - Extraocular Movements Intact RESPIRATORY:  normal respiratory effort, symmetric expansion CARDIOVASCULAR:  no visible JVD SKIN:  no rash, lesions or ulcers. MUSCULOSKELETAL:  no obvious deformities. NEURO:  alert and oriented x 3, no obvious focal deficit PSYCH:  normal affect  ASSESSMENT & PLAN:    History of CAD, with continued intermittent atypical chest pain and now s/p 2 additional stents 08/2018 -continue aspirin 81 mg -continue clopidogrel for at least 6 mos post PCI -working with lipid clinic on statins, see below -on carvedilol 3.125 mg BID. Increasing today given persistent elevated blood pressure -no typical angina symptoms, does endorse history of noncardiac chest pain as well. However, has had sharp pain and one episode relieved with SL NG -counseled on red flag warning signs that need immediate medical attention  Hypercholesterolemia plus possible hypertriglyceridemia: LDL goal <70. Checked lipids8/2020, LDL 138, TG 254 (not fasting) -working with lipids clinic. Now tolerating ezetimibe and rosuvastatin once/week. Will increase to twice/week today. -due for repeat lipids, he will get when he can (difficult to get to office with his job) -intolerant of atorvastatin, had whole body hives (though this last >1 mos after stopping statin) and joint  pain  Hypertension: above goal on recent home checks -increasing carvedilol dose. Prefers this over starting new medication. -counseled on how to check BP -instructed on when to call.  Continue tobacco abuse: not interested in quitting at this time.  COVID-19 Education: The signs and symptoms of COVID-19 were discussed with the patient and how to seek care for testing (follow up with PCP or arrange E-visit).  The importance of social distancing was discussed today.  Time:   Today, I have spent 13 minutes face to face with the patient  with telehealth technology discussing the above problems.  Total visit time including documentation, review, orders 20 minutes.  Patient Instructions  Medication Instructions:  Increase Crestor 5 mg to two times a week. Increase Carvedilol to 6.25 mg twice a week.  *If you need a refill on your cardiac medications before your next appointment, please call your pharmacy*  Lab Work: None  Testing/Procedures: None  Follow-Up: At [facility], you and your health needs are our priority.  As part of our continuing mission to provide you with exceptional heart care, we have created designated Provider Care Teams.  These Care Teams include your primary Cardiologist (physician) and Advanced Practice Providers (APPs -  Physician Assistants and Nurse Practitioners) who all work together to provide you with the care you need, when you need it.  Your next appointment:   1 month(s)  The format for your next appointment:   Virtual Visit   Provider:   Buford Dresser, MD      Signed, Buford Dresser, MD  01/12/2019  [place]

## 2019-01-15 ENCOUNTER — Encounter: Payer: Self-pay | Admitting: Cardiology

## 2019-01-31 ENCOUNTER — Encounter: Payer: Self-pay | Admitting: Family Medicine

## 2019-01-31 MED ORDER — METFORMIN HCL 1000 MG PO TABS: 1000.0000 mg | ORAL_TABLET | Freq: Two times a day (BID) | ORAL | 0 refills | Status: DC

## 2019-02-03 ENCOUNTER — Telehealth: Payer: Medicare Other | Admitting: Cardiology

## 2019-02-06 MED ORDER — PANTOPRAZOLE SODIUM 40 MG PO TBEC: 40.0000 mg | DELAYED_RELEASE_TABLET | Freq: Every day | ORAL | 2 refills | Status: DC

## 2019-02-16 ENCOUNTER — Telehealth (INDEPENDENT_AMBULATORY_CARE_PROVIDER_SITE_OTHER): Payer: Medicare Other | Admitting: Cardiology

## 2019-02-16 ENCOUNTER — Encounter: Payer: Self-pay | Admitting: Cardiology

## 2019-02-16 VITALS — BP 157/82 | Ht 63.0 in | Wt 152.0 lb

## 2019-02-16 DIAGNOSIS — Z95820 Peripheral vascular angioplasty status with implants and grafts: Secondary | ICD-10-CM | POA: Diagnosis not present

## 2019-02-16 DIAGNOSIS — Z720 Tobacco use: Secondary | ICD-10-CM

## 2019-02-16 DIAGNOSIS — I2510 Atherosclerotic heart disease of native coronary artery without angina pectoris: Secondary | ICD-10-CM

## 2019-02-16 DIAGNOSIS — E782 Mixed hyperlipidemia: Secondary | ICD-10-CM | POA: Diagnosis not present

## 2019-02-16 DIAGNOSIS — Z7189 Other specified counseling: Secondary | ICD-10-CM

## 2019-02-16 DIAGNOSIS — I10 Essential (primary) hypertension: Secondary | ICD-10-CM | POA: Diagnosis not present

## 2019-02-16 MED ORDER — ROSUVASTATIN CALCIUM 5 MG PO TABS: 5.0000 mg | ORAL_TABLET | ORAL | 3 refills | Status: DC

## 2019-02-16 NOTE — Patient Instructions (Signed)
Medication Instructions:  Increase Rosuvastatin 5 mg to 3 times a week  *If you need a refill on your cardiac medications before your next appointment, please call your pharmacy*  Lab Work: None  Testing/Procedures: None  Follow-Up: At [facility], you and your health needs are our priority.  As part of our continuing mission to provide you with exceptional heart care, we have created designated Provider Care Teams.  These Care Teams include your primary Cardiologist (physician) and Advanced Practice Providers (APPs -  Physician Assistants and Nurse Practitioners) who all work together to provide you with the care you need, when you need it.  Your next appointment:   2 month(s)  The format for your next appointment:   In Person  Provider:   Buford Dresser, MD

## 2019-02-16 NOTE — Progress Notes (Signed)
Virtual Visit via Video Note   This visit type was conducted due to national recommendations for restrictions regarding the COVID-19 Pandemic (e.g. social distancing) in an effort to limit this patient's exposure and mitigate transmission in our community.  Due to his co-morbid illnesses, this patient is at least at moderate risk for complications without adequate follow up.  This format is felt to be most appropriate for this patient at this time.  All issues noted in this document were discussed and addressed.  A limited physical exam was performed with this format.  Please refer to the patient's chart for his consent to telehealth for [facility].   Date:  02/16/2019   ID:  Dean Singh, Dean Singh, Dean Singh, MRN [id number]  Patient Location: Home Provider Location: Office  PCP:  Mosie Lukes, MD  Cardiologist:  Buford Dresser, MD  Electrophysiologist:  None   Evaluation Performed:  Follow-Up Visit  Chief Complaint:  Follow up  History of Present Illness:    Dean Singh is a 59 y.o. male with a hx of CAD with 2 prior stents, CVA, tobacco abuse, hyperlipidemia, uncontrolled type II diabetes.Pertinent non-CV medical history includes bladder cancer, severe peripheral neuropathy, DDD, GERD.My initial visit with him was on 08/23/18.  After initial visit 08/23/18, sent for cath with Dr. Burt Knack. Cath 09/02/18 showed: 1. Left dominant coronary system with continued patency of the stented segment in the proximal/mid LAD and severe de novo stenosis in the mid and distal circumflex 2. Successful PCI of the mid and distal circumflex using a 2.5 x 30 mm resolute Onyx DES in the distal vessel and a 3.0 x Singh mm resolute Onyx DES in the mid vessel 3. Normal LV systolic function with normal LVEDP.  Plan: same day PCI, Pt loaded with clopidogrel on the table. Should remain on DAPT with ASA and clopidogrel at least 6 months (stable CAD recommendation).  Seen by Tommy Medal in  lipid clinic 10/11/18. Discussed cholesterol options  Patient with ASCVD and hyperlipidemia. Would be good candidate for PCSK-9 inhibitor. However patient is on Medicaid, and they require 30 days trial on both atorvastatin and rosuvastatin (high dose) before consideration of Repatha. Reviewed other options with patient. Explained that fenofibrate is strictly to help with triglycerides - only have minimal effect on LDL. Today he will start with ezetimibe 10 mg daily and after 2 weeks, if he is tolerating it well, will start on rosuvastatin 5 mg once weekly. He is to contact us after 4 weeks of the rosuvastatin and we can draw labs to see how much benefit he gets. At that time we can hopefully start to titrate the rosuvastatin dose upward for maximal benefit.  Today: AM BP 157/82, hasn't taken medications yet. Has home numbers, can't find the list but reports better improvement.  Taking rosuvastatin Tuesday and Sundays, doing well with this. No rash or itching. Willing to go up to 3x/week.   No more chest pain. No longer working in [employer], stress is tremendously better. Still working, doing carpentry side jobs.   Hasn't considered tobacco cessation again, not interested in quitting at this time.  Denies chest pain, shortness of breath at rest or with normal exertion. No PND, orthopnea, LE edema or unexpected weight gain. No syncope or palpitations.   Past Medical History:  Diagnosis Date  . Arthritis    DDD  . Bladder cancer ([place])   . Bladder cancer ([place]) 08/17/2018  . CAD in native artery 08/17/2018  .  Current nicotine use 08/17/2018  . Diabetes mellitus without complication ([place])   . GERD (gastroesophageal reflux disease)   . H/O: stroke   . Heart attack ([place])    2 stents  . Hx of hemorrhoids 08/17/2018  . Hyperlipidemia   . Hypertension   . Peripheral neuropathy 08/17/2018  . Stroke ([place]) 57  . Visual changes 08/17/2018   Past Surgical History:  Procedure  Laterality Date  . BLADDER TUMOR EXCISION     cancer  . CAROTID ENDARTERECTOMY Left   . CORONARY ANGIOPLASTY WITH STENT PLACEMENT     2 stent  . CORONARY STENT INTERVENTION N/A 09/02/2018   Procedure: CORONARY STENT INTERVENTION;  Surgeon: Sherren Mocha, MD;  Location: [place] [facility];  Service: Cardiovascular;  Laterality: N/A;  . HERNIA REPAIR     right inguinal 2017  . LEFT HEART CATH AND CORONARY ANGIOGRAPHY N/A 09/02/2018   Procedure: LEFT HEART CATH AND CORONARY ANGIOGRAPHY;  Surgeon: Sherren Mocha, MD;  Location: [place] [facility];  Service: Cardiovascular;  Laterality: N/A;     Current Meds  Medication Sig  . aspirin EC 81 MG tablet Take 1 tablet (81 mg total) by mouth daily.  . carvedilol (COREG) 6.25 MG tablet Take 1 tablet (6.25 mg total) by mouth 2 (two) times daily with a meal.  . clopidogrel (PLAVIX) 75 MG tablet TAKE 1 TABLET BY MOUTH EVERY DAY  . Continuous Blood Gluc Sensor (FREESTYLE LIBRE 14 DAY SENSOR) MISC Use every 14 days to check sugars daily  . ezetimibe (ZETIA) 10 MG tablet Take 1 tablet (10 mg total) by mouth daily.  [name] gabapentin (NEURONTIN) 100 MG capsule TAKE 1-3 CAPSULES BY MOUTH AT BEDTIME.  [name] glipiZIDE (GLUCOTROL XL) 5 MG 24 hr tablet Take 5 mg by mouth daily with breakfast.   . glucose blood (ONETOUCH ULTRA) test strip USE TWICE DAILY AS DIRECTED ( E11.65)  . glucose blood (PRECISION QID TEST) test strip Use twice daily as directed  . metFORMIN (GLUCOPHAGE) 1000 MG tablet Take 1 tablet (1,000 mg total) by mouth 2 (two) times daily.  . nitroGLYCERIN (NITROSTAT) 0.4 MG SL tablet Place 1 tablet (0.4 mg total) under the tongue every 5 (five) minutes as needed for chest pain.  . pantoprazole (PROTONIX) 40 MG tablet Take 1 tablet (40 mg total) by mouth daily.  Derrill Memo ON 02/17/2019] rosuvastatin (CRESTOR) 5 MG tablet Take 1 tablet (5 mg total) by mouth 3 (three) times a week.  . Saw Palmetto, Serenoa repens, (SAW PALMETTO PO) Take 1 capsule by mouth daily  as needed (When having prostate problems).   . [DISCONTINUED] rosuvastatin (CRESTOR) 5 MG tablet Take 1 tablet (5 mg total) by mouth 2 (two) times a week.     Allergies:   Statins and Morphine   Social History   Tobacco Use  . Smoking status: Current Every Day Smoker  . Smokeless tobacco: Current User  Substance Use Topics  . Alcohol use: Not Currently  . Drug use: Not Currently     Family Hx: The patient's family history includes Addison's disease in his sister; Arthritis in his father and mother; COPD in his mother; Dementia in his maternal grandfather, maternal grandmother, and mother; Diabetes in his brother, father, maternal grandfather, maternal grandmother, mother, and sister; Heart disease in his father, maternal grandfather, maternal grandmother, and mother; Hyperlipidemia in his brother, father, maternal grandfather, maternal grandmother, and sister; Hypertension in his brother and sister; Obesity in his brother, brother, and sister; Osteoporosis in his father  and mother; Other in his paternal grandmother; Parkinson's disease in his father; Prostate cancer in his paternal grandfather; Rheumatic fever in his son; Stickler syndrome in his son.  ROS:   Please see the history of present illness.    All other systems reviewed and are negative.   Prior CV studies:   The following studies were reviewed today: No new since most recent visit  Labs/Other Tests and Data Reviewed:    EKG:  An ECG dated 09/13/18 was personally reviewed today and demonstrated:  NSR  Recent Labs: 08/23/2018: ALT 6; BUN 16; Creatinine, Ser 0.91; Hemoglobin 16.7; Platelets 290; Potassium 5.0; Sodium 135   Recent Lipid Panel Lab Results  Component Value Date/Time   CHOL 218 (H) 08/23/2018 11:48 AM   TRIG 254 (H) 08/23/2018 11:48 AM   HDL 29 (L) 08/23/2018 11:48 AM   CHOLHDL 7.5 (H) 08/23/2018 11:48 AM   LDLCALC 138 (H) 08/23/2018 11:48 AM    Wt Readings from Last 3 Encounters:  02/16/19 152 lb  (68.9 kg)  01/12/19 152 lb (68.9 kg)  09/13/18 152 lb 6.4 oz (69.1 kg)     Objective:    Vital Signs:  BP (!) 157/82   Ht 5\' 3"  (1.6 m)   Wt 152 lb (68.9 kg)   BMI Singh.93 kg/m    VITAL SIGNS:  reviewed GEN:  no acute distress EYES:  sclerae anicteric, EOMI - Extraocular Movements Intact RESPIRATORY:  normal respiratory effort, symmetric expansion CARDIOVASCULAR:  no visible JVD SKIN:  no rash, lesions or ulcers. MUSCULOSKELETAL:  no obvious deformities. NEURO:  alert and oriented x 3, no obvious focal deficit PSYCH:  normal affect  ASSESSMENT & PLAN:    History of CAD, s/p 2 additional stents 08/2018: no chest pain since last visit -continue aspirin 81 mg -continue clopidogrel for at least 6 mos post PCI, preferably 12 -working with lipid clinic on statins, see below -tolerating carvedilol 6.25 mg BID -counseled on red flag warning signs that need immediate medical attention  Hypercholesterolemia plus possible hypertriglyceridemia: LDL goal <70. Checked lipids8/2020, LDL 138, TG 254 (not fasting) -working with lipids clinic. Now tolerating ezetimibe and rosuvastatin intermittently. Will increase rosuvastatin to 3 times/week today. -due for repeat lipids, we will check at next visit -intolerant of atorvastatin, had whole body hives (though this last >1 mos after stopping statin) and joint pain  Hypertension: elevated today, but hasn't taken AM medications. -we increased carvedilol dose at last visit. He is tolerating this. Has other home numbers that are better but cannot find list -continue carvedilol for now. He does not want to add medications if this can be avoided. -counseled on how to check BP -instructed on when to call.  Continues tobacco abuse: not interested in quitting at this time, discussed  CV risk and secondary prevention -recommend heart healthy/Mediterranean diet, with whole grains, fruits, vegetable, fish, lean meats, nuts, and olive oil. Limit  salt. -recommend moderate walking, 3-5 times/week for 30-50 minutes each session. Aim for at least 150 minutes.week. Goal should be pace of 3 miles/hours, or walking 1.5 miles in 30 minutes -recommend avoidance of tobacco products. Avoid excess alcohol. -has type II diabetes and CAD. We have discussed SGLT2i and [place] previously. He wants to avoid high cost meds. If these become affordable for him, readdress  COVID-19 Education: The signs and symptoms of COVID-19 were discussed with the patient and how to seek care for testing (follow up with PCP or arrange E-visit).  The importance of social distancing was  discussed today.  Time:   Today, I have spent 12 minutes face to face with the patient with telehealth technology discussing the above problems.   Patient Instructions  Medication Instructions:  Increase Rosuvastatin 5 mg to 3 times a week  *If you need a refill on your cardiac medications before your next appointment, please call your pharmacy*  Lab Work: None  Testing/Procedures: None  Follow-Up: At [facility], you and your health needs are our priority.  As part of our continuing mission to provide you with exceptional heart care, we have created designated Provider Care Teams.  These Care Teams include your primary Cardiologist (physician) and Advanced Practice Providers (APPs -  Physician Assistants and Nurse Practitioners) who all work together to provide you with the care you need, when you need it.  Your next appointment:   2 month(s)  The format for your next appointment:   In Person  Provider:   Buford Dresser, MD      Signed, Buford Dresser, MD  02/16/2019  [place]

## 2019-03-31 ENCOUNTER — Other Ambulatory Visit: Payer: Self-pay | Admitting: Family Medicine

## 2019-04-16 ENCOUNTER — Other Ambulatory Visit: Payer: Self-pay | Admitting: Family Medicine

## 2019-04-17 NOTE — Telephone Encounter (Signed)
Patient has only seen pcp once. He will see cardiology soon for this refill

## 2019-05-01 ENCOUNTER — Other Ambulatory Visit: Payer: Self-pay

## 2019-05-01 ENCOUNTER — Encounter: Payer: Self-pay | Admitting: Cardiology

## 2019-05-01 ENCOUNTER — Ambulatory Visit (INDEPENDENT_AMBULATORY_CARE_PROVIDER_SITE_OTHER): Payer: Medicare Other | Admitting: Cardiology

## 2019-05-01 VITALS — BP 124/66 | HR 74 | Temp 97.0°F | Ht 64.0 in | Wt 147.0 lb

## 2019-05-01 DIAGNOSIS — Z79899 Other long term (current) drug therapy: Secondary | ICD-10-CM

## 2019-05-01 DIAGNOSIS — I2510 Atherosclerotic heart disease of native coronary artery without angina pectoris: Secondary | ICD-10-CM

## 2019-05-01 DIAGNOSIS — I779 Disorder of arteries and arterioles, unspecified: Secondary | ICD-10-CM

## 2019-05-01 DIAGNOSIS — Z720 Tobacco use: Secondary | ICD-10-CM

## 2019-05-01 DIAGNOSIS — Z95820 Peripheral vascular angioplasty status with implants and grafts: Secondary | ICD-10-CM | POA: Diagnosis not present

## 2019-05-01 DIAGNOSIS — E782 Mixed hyperlipidemia: Secondary | ICD-10-CM

## 2019-05-01 DIAGNOSIS — I25118 Atherosclerotic heart disease of native coronary artery with other forms of angina pectoris: Secondary | ICD-10-CM | POA: Diagnosis not present

## 2019-05-01 DIAGNOSIS — I10 Essential (primary) hypertension: Secondary | ICD-10-CM

## 2019-05-01 DIAGNOSIS — E119 Type 2 diabetes mellitus without complications: Secondary | ICD-10-CM | POA: Insufficient documentation

## 2019-05-01 MED ORDER — ROSUVASTATIN CALCIUM 5 MG PO TABS: 5.0000 mg | ORAL_TABLET | ORAL | 3 refills | Status: DC

## 2019-05-01 NOTE — Progress Notes (Signed)
Cardiology Office Note:    Date:  05/01/2019   ID:  [name], Dean Singh 04-14-1960, MRN [id number]  PCP:  Mosie Lukes, MD  Cardiologist:  Buford Dresser, MD PhD  Referring MD: Mosie Lukes, MD   CC: follow up  History of Present Illness:    Dean Singh is a 59 y.o. male with a hx of CAD s/p multiple stents, CVA, tobacco abuse, hyperlipidemia, uncontrolled type II diabetes. Pertinent non-CV medical history includes bladder cancer, severe peripheral neuropathy, DDD, GERD. My initial visit with him was on 08/23/18.  Cath 09/02/18 showed: 1.  Left dominant coronary system with continued patency of the stented segment in the proximal/mid LAD and severe de novo stenosis in the mid and distal circumflex 2.  Successful PCI of the mid and distal circumflex using a 2.5 x 30 mm resolute Onyx DES in the distal vessel and a 3.0 x 26 mm resolute Onyx DES in the mid vessel 3.  Normal LV systolic function with normal LVEDP.  Plan: same day PCI, Pt loaded with clopidogrel on the table. Should remain on DAPT with ASA and clopidogrel at least 6 months (stable CAD recommendation).  Today: Doing well overall. Taking rosuvastatin 3x/week and tolerating, though has occasional rash on the spine that he is not sure if this is related. On exam today, has excoriations and scabs, no active lesions.  Had chest pain last week in his throat and his job while being active. Working 3 jobs, Programmer, systems work, very high level of activity. Symptoms lasted about a minute. No associated symptoms such as nausea/diaphoresis/dyspnea. Improved with rest, didn't take a nitro. This is his typical angina.  Denies shortness of breath at rest or with normal exertion. No PND, orthopnea, LE edema or unexpected weight gain. No syncope or palpitations.  Past Medical History:  Diagnosis Date  . Arthritis    DDD  . Bladder cancer ([place])   . Bladder cancer ([place]) 08/17/2018  . CAD in native artery 08/17/2018  .  Current nicotine use 08/17/2018  . Diabetes mellitus without complication ([place])   . GERD (gastroesophageal reflux disease)   . H/O: stroke   . Heart attack ([place])    2 stents  . Hx of hemorrhoids 08/17/2018  . Hyperlipidemia   . Hypertension   . Peripheral neuropathy 08/17/2018  . Stroke ([place]) 57  . Visual changes 08/17/2018    Past Surgical History:  Procedure Laterality Date  . BLADDER TUMOR EXCISION     cancer  . CAROTID ENDARTERECTOMY Left   . CORONARY ANGIOPLASTY WITH STENT PLACEMENT     2 stent  . CORONARY STENT INTERVENTION N/A 09/02/2018   Procedure: CORONARY STENT INTERVENTION;  Surgeon: Sherren Mocha, MD;  Location: [place] [facility];  Service: Cardiovascular;  Laterality: N/A;  . HERNIA REPAIR     right inguinal 2017  . LEFT HEART CATH AND CORONARY ANGIOGRAPHY N/A 09/02/2018   Procedure: LEFT HEART CATH AND CORONARY ANGIOGRAPHY;  Surgeon: Sherren Mocha, MD;  Location: [place] [facility];  Service: Cardiovascular;  Laterality: N/A;    Current Medications: Current Outpatient Medications on File Prior to Visit  Medication Sig  . aspirin EC 81 MG tablet Take 1 tablet (81 mg total) by mouth daily.  . carvedilol (COREG) 3.125 MG tablet Take 3.125 mg by mouth 2 (two) times daily.  . clopidogrel (PLAVIX) 75 MG tablet TAKE 1 TABLET BY MOUTH EVERY DAY  . gabapentin (NEURONTIN) 100 MG capsule TAKE 1-3 CAPSULES BY MOUTH  AT BEDTIME  . glipiZIDE (GLUCOTROL XL) 5 MG 24 hr tablet Take 5 mg by mouth daily with breakfast.   . glucose blood (ONETOUCH ULTRA) test strip USE TWICE DAILY AS DIRECTED ( E11.65)  . metFORMIN (GLUCOPHAGE) 1000 MG tablet TAKE 1 TABLET BY MOUTH TWICE A DAY  . nitroGLYCERIN (NITROSTAT) 0.4 MG SL tablet Place 1 tablet (0.4 mg total) under the tongue every 5 (five) minutes as needed for chest pain.  . pantoprazole (PROTONIX) 40 MG tablet Take 1 tablet (40 mg total) by mouth daily.  . Saw Palmetto, Serenoa repens, (SAW PALMETTO PO) Take 1 capsule by mouth daily as  needed (When having prostate problems).   . ezetimibe (ZETIA) 10 MG tablet Take 1 tablet (10 mg total) by mouth daily.   No current facility-administered medications on file prior to visit.     Allergies:   Statins and Morphine   Social History   Tobacco Use  . Smoking status: Current Every Day Smoker  . Smokeless tobacco: Current User  Substance Use Topics  . Alcohol use: Not Currently  . Drug use: Not Currently    Family History: The patient's family history includes Addison's disease in his sister; Arthritis in his father and mother; COPD in his mother; Dementia in his maternal grandfather, maternal grandmother, and mother; Diabetes in his brother, father, maternal grandfather, maternal grandmother, mother, and sister; Heart disease in his father, maternal grandfather, maternal grandmother, and mother; Hyperlipidemia in his brother, father, maternal grandfather, maternal grandmother, and sister; Hypertension in his brother and sister; Obesity in his brother, brother, and sister; Osteoporosis in his father and mother; Other in his paternal grandmother; Parkinson's disease in his father; Prostate cancer in his paternal grandfather; Rheumatic fever in his son; Stickler syndrome in his son.  ROS:   Please see the history of present illness.  Additional pertinent ROS otherwise unremarkable.  EKGs/Labs/Other Studies Reviewed:    The following studies were reviewed today: Cath as above  EKG:  EKG is personally reviewed.  The ekg ordered 09/13/2018 demonstrates NSR  Recent Labs: 08/23/2018: ALT 6; BUN 16; Creatinine, Ser 0.91; Hemoglobin 16.7; Platelets 290; Potassium 5.0; Sodium 135  Recent Lipid Panel    Component Value Date/Time   CHOL 218 (H) 08/23/2018 1148   TRIG 254 (H) 08/23/2018 1148   HDL 29 (L) 08/23/2018 1148   CHOLHDL 7.5 (H) 08/23/2018 1148   LDLCALC 138 (H) 08/23/2018 1148    Physical Exam:    VS:  BP 124/66   Pulse 74   Temp (!) 97 F (36.1 C)   Ht 5\' 4"   (1.626 m)   Wt 147 lb (66.7 kg)   SpO2 96%   BMI 25.23 kg/m     Wt Readings from Last 3 Encounters:  05/01/19 147 lb (66.7 kg)  02/16/19 152 lb (68.9 kg)  01/12/19 152 lb (68.9 kg)     GEN: Well nourished, well developed in no acute distress HEENT: Normal, moist mucous membranes NECK: No JVD. Well healed R CEA scar CARDIAC: regular rhythm, normal S1 and S2, no rubs or gallops. No murmur. VASCULAR: Radial and DP pulses 2+ bilaterally. Faint bilateral carotid bruits RESPIRATORY:  Clear to auscultation without rales, wheezing or rhonchi  ABDOMEN: Soft, non-tender, non-distended MUSCULOSKELETAL:  Ambulates independently SKIN: Warm and dry, no edema. Excoriations without erythema over mid back area. NEUROLOGIC:  Alert and oriented x 3. No focal neuro deficits noted. PSYCHIATRIC:  Normal affect     ASSESSMENT:    1. Coronary artery  disease of native artery of native heart with stable angina pectoris ([place])   2. Mixed hyperlipidemia   3. Status post angioplasty with stent   4. Essential hypertension   5. Tobacco abuse   6. Bilateral carotid artery disease, unspecified type ([place])   7. Type 2 diabetes mellitus without obesity ([place])   8. Medication management    PLAN:    History of CAD, s/p 2 additional stents 08/2018:  -has had anginal symptoms with heavy exertion, resolves with rest -continue aspirin 81 mg -continue clopidogrel for at least 6 mos post PCI, preferably 12 months -tolerating carvedilol 6.25 mg BID -counseled on red flag warning signs that need immediate medical attention -has PRN SL NG  Hypercholesterolemiaplus possible hypertriglyceridemia: LDL goal <70.  -check lipids, CMP today -working with lipids clinic. Now tolerating ezetimibe and rosuvastatin 3x/week. -intolerant of atorvastatin, had whole body hives(though this last >1 mos after stopping statin)and joint pain  Hypertension:goal <130/80 -continue carvedilol for now. He does not want to add  medications if this can be avoided. -counseled on how to check BP -instructed on when to call.  Carotid stenosis s/p R CEA: faint bilateral bruits today, asymptomatic -lipid control, tobacco cessation, aspirin as above for ASCVD  Continues tobacco abuse: not interested in quitting at this time, discussed  Type II diabetes, without obesity: -has type II diabetes and CAD. We have discussed SGLT2i and [place] previously. He wants to avoid high cost meds. If these become affordable for him, readdress -recheck A1c today  CV risk and secondary prevention -recommend heart healthy/Mediterranean diet, with whole grains, fruits, vegetable, fish, lean meats, nuts, and olive oil. Limit salt. -recommend moderate walking, 3-5 times/week for 30-50 minutes each session. Aim for at least 150 minutes.week. Goal should be pace of 3 miles/hours, or walking 1.5 miles in 30 minutes -recommend avoidance of tobacco products. Avoid excess alcohol.  Plan for follow up: 3 mos  Medication Adjustments/Labs and Tests Ordered: Current medicines are reviewed at length with the patient today.  Concerns regarding medicines are outlined above.  Orders Placed This Encounter  Procedures  . Comprehensive metabolic panel  . HgB A1c  . Lipid panel   Meds ordered this encounter  Medications  . DISCONTD: rosuvastatin (CRESTOR) 5 MG tablet    Sig: Take 1 tablet (5 mg total) by mouth 3 (three) times a week.    Dispense:  45 tablet    Refill:  3  . rosuvastatin (CRESTOR) 5 MG tablet    Sig: Take 1 tablet (5 mg total) by mouth 3 (three) times a week.    Dispense:  45 tablet    Refill:  3    Patient Instructions  Medication Instructions:  Your Physician recommend you continue on your current medication as directed.    *If you need a refill on your cardiac medications before your next appointment, please call your pharmacy*   Lab Work: Your physician recommends that you return for lab work today ( CMP, LIPID,  A1C)  If you have labs (blood work) drawn today and your tests are completely normal, you will receive your results only by: [name] MyChart Message (if you have MyChart) OR . A paper copy in the mail If you have any lab test that is abnormal or we need to change your treatment, we will call you to review the results.   Testing/Procedures: None   Follow-Up: At [facility], you and your health needs are our priority.  As part of our continuing mission to  provide you with exceptional heart care, we have created designated Provider Care Teams.  These Care Teams include your primary Cardiologist (physician) and Advanced Practice Providers (APPs -  Physician Assistants and Nurse Practitioners) who all work together to provide you with the care you need, when you need it.  We recommend signing up for the patient portal called "MyChart".  Sign up information is provided on this After Visit Summary.  MyChart is used to connect with patients for Virtual Visits (Telemedicine).  Patients are able to view lab/test results, encounter notes, upcoming appointments, etc.  Non-urgent messages can be sent to your provider as well.   To learn more about what you can do with MyChart, go to [email or web address].    Your next appointment:   3 month(s)  The format for your next appointment:   Either In Person or Virtual  Provider:   Buford Dresser, MD        Signed, Buford Dresser, MD PhD 05/01/2019 2:04 PM    [place]

## 2019-05-01 NOTE — Patient Instructions (Signed)
Medication Instructions:  Your Physician recommend you continue on your current medication as directed.    *If you need a refill on your cardiac medications before your next appointment, please call your pharmacy*   Lab Work: Your physician recommends that you return for lab work today ( CMP, LIPID, A1C)  If you have labs (blood work) drawn today and your tests are completely normal, you will receive your results only by: [name] MyChart Message (if you have MyChart) OR . A paper copy in the mail If you have any lab test that is abnormal or we need to change your treatment, we will call you to review the results.   Testing/Procedures: None   Follow-Up: At [facility], you and your health needs are our priority.  As part of our continuing mission to provide you with exceptional heart care, we have created designated Provider Care Teams.  These Care Teams include your primary Cardiologist (physician) and Advanced Practice Providers (APPs -  Physician Assistants and Nurse Practitioners) who all work together to provide you with the care you need, when you need it.  We recommend signing up for the patient portal called "MyChart".  Sign up information is provided on this After Visit Summary.  MyChart is used to connect with patients for Virtual Visits (Telemedicine).  Patients are able to view lab/test results, encounter notes, upcoming appointments, etc.  Non-urgent messages can be sent to your provider as well.   To learn more about what you can do with MyChart, go to [email or web address].    Your next appointment:   3 month(s)  The format for your next appointment:   Either In Person or Virtual  Provider:   Buford Dresser, MD

## 2019-05-02 LAB — COMPREHENSIVE METABOLIC PANEL
ALT: 12 IU/L (ref 0–44)
AST: 10 IU/L (ref 0–40)
Albumin/Globulin Ratio: 1.8 (ref 1.2–2.2)
Albumin: 4.6 g/dL (ref 3.8–4.9)
Alkaline Phosphatase: 105 IU/L (ref 39–117)
BUN/Creatinine Ratio: 20 (ref 9–20)
BUN: 18 mg/dL (ref 6–24)
Bilirubin Total: 0.5 mg/dL (ref 0.0–1.2)
CO2: 22 mmol/L (ref 20–29)
Calcium: 10.2 mg/dL (ref 8.7–10.2)
Chloride: 95 mmol/L — ABNORMAL LOW (ref 96–106)
Creatinine, Ser: 0.88 mg/dL (ref 0.76–1.27)
GFR calc Af Amer: 109 mL/min/{1.73_m2} (ref >59)
GFR calc non Af Amer: 95 mL/min/{1.73_m2} (ref >59)
Globulin, Total: 2.6 g/dL (ref 1.5–4.5)
Glucose: 443 mg/dL — ABNORMAL HIGH (ref 65–99)
Potassium: 5.1 mmol/L (ref 3.5–5.2)
Sodium: 132 mmol/L — ABNORMAL LOW (ref 134–144)
Total Protein: 7.2 g/dL (ref 6.0–8.5)

## 2019-05-02 LAB — LIPID PANEL
Chol/HDL Ratio: 5.2 ratio — ABNORMAL HIGH (ref 0.0–5.0)
Cholesterol, Total: 177 mg/dL (ref 100–199)
HDL: 34 mg/dL — ABNORMAL LOW (ref >39)
LDL Chol Calc (NIH): 93 mg/dL (ref 0–99)
Triglycerides: 300 mg/dL — ABNORMAL HIGH (ref 0–149)
VLDL Cholesterol Cal: 50 mg/dL — ABNORMAL HIGH (ref 5–40)

## 2019-05-02 LAB — HEMOGLOBIN A1C
Est. average glucose Bld gHb Est-mCnc: 321 mg/dL
Hgb A1c MFr Bld: 12.8 % — ABNORMAL HIGH (ref 4.8–5.6)

## 2019-05-04 ENCOUNTER — Other Ambulatory Visit: Payer: Self-pay

## 2019-05-04 MED ORDER — PANTOPRAZOLE SODIUM 40 MG PO TBEC: 40.0000 mg | DELAYED_RELEASE_TABLET | Freq: Every day | ORAL | 0 refills | Status: DC

## 2019-07-11 ENCOUNTER — Other Ambulatory Visit: Payer: Self-pay | Admitting: Family Medicine

## 2019-07-12 MED ORDER — ROSUVASTATIN CALCIUM 5 MG PO TABS: 5.0000 mg | ORAL_TABLET | ORAL | 3 refills | Status: DC

## 2019-07-23 ENCOUNTER — Other Ambulatory Visit: Payer: Self-pay | Admitting: Family Medicine

## 2019-07-25 ENCOUNTER — Encounter: Payer: Self-pay | Admitting: Family Medicine

## 2019-07-25 MED ORDER — GABAPENTIN 100 MG PO CAPS: 100.0000 mg | ORAL_CAPSULE | Freq: Every day | ORAL | 0 refills | Status: DC

## 2019-07-25 MED ORDER — GLIPIZIDE ER 5 MG PO TB24: 5.0000 mg | ORAL_TABLET | Freq: Every day | ORAL | 0 refills | Status: DC

## 2019-07-25 NOTE — Addendum Note (Signed)
Addended byDamita Dunnings D on: 07/25/2019 12:07 PM   Modules accepted: Orders

## 2019-07-28 ENCOUNTER — Other Ambulatory Visit: Payer: Self-pay | Admitting: Cardiology

## 2019-07-28 ENCOUNTER — Telehealth (INDEPENDENT_AMBULATORY_CARE_PROVIDER_SITE_OTHER): Payer: Medicare Other | Admitting: Cardiology

## 2019-07-28 VITALS — BP 124/100 | HR 73 | Ht 64.0 in | Wt 140.0 lb

## 2019-07-28 DIAGNOSIS — Z95820 Peripheral vascular angioplasty status with implants and grafts: Secondary | ICD-10-CM

## 2019-07-28 DIAGNOSIS — E782 Mixed hyperlipidemia: Secondary | ICD-10-CM

## 2019-07-28 DIAGNOSIS — Z79899 Other long term (current) drug therapy: Secondary | ICD-10-CM

## 2019-07-28 DIAGNOSIS — I2510 Atherosclerotic heart disease of native coronary artery without angina pectoris: Secondary | ICD-10-CM

## 2019-07-28 DIAGNOSIS — Z716 Tobacco abuse counseling: Secondary | ICD-10-CM

## 2019-07-28 DIAGNOSIS — Z720 Tobacco use: Secondary | ICD-10-CM

## 2019-07-28 DIAGNOSIS — Z7189 Other specified counseling: Secondary | ICD-10-CM

## 2019-07-28 DIAGNOSIS — I779 Disorder of arteries and arterioles, unspecified: Secondary | ICD-10-CM

## 2019-07-28 DIAGNOSIS — E119 Type 2 diabetes mellitus without complications: Secondary | ICD-10-CM

## 2019-07-28 DIAGNOSIS — I10 Essential (primary) hypertension: Secondary | ICD-10-CM

## 2019-07-28 NOTE — Progress Notes (Signed)
Virtual Visit via Telephone Note   This visit type was conducted due to national recommendations for restrictions regarding the COVID-19 Pandemic (e.g. social distancing) in an effort to limit this patient's exposure and mitigate transmission in our community.  Due to his co-morbid illnesses, this patient is at least at moderate risk for complications without adequate follow up.  This format is felt to be most appropriate for this patient at this time.  The patient did not have access to video technology/had technical difficulties with video requiring transitioning to audio format only (telephone).  All issues noted in this document were discussed and addressed.  No physical exam could be performed with this format.  Please refer to the patient's chart for his  consent to telehealth for [facility].   The patient was identified using 2 identifiers.  Patient Location: Home Provider Location: Home Office   History of Present Illness:    Dean Singh is a 59 y.o. male with a hx of CAD s/p multiple stents, CVA, tobacco abuse, hyperlipidemia, uncontrolled type II diabetes. Pertinent non-CV medical history includes bladder cancer, severe peripheral neuropathy, DDD, GERD. My initial visit with him was on 08/23/18.  Cath 09/02/18 showed: 1.  Left dominant coronary system with continued patency of the stented segment in the proximal/mid LAD and severe de novo stenosis in the mid and distal circumflex 2.  Successful PCI of the mid and distal circumflex using a 2.5 x 30 mm resolute Onyx DES in the distal vessel and a 3.0 x 26 mm resolute Onyx DES in the mid vessel 3.  Normal LV systolic function with normal LVEDP.  Plan: same day PCI, Pt loaded with clopidogrel on the table. Should remain on DAPT with ASA and clopidogrel at least 6 months (stable CAD recommendation).  Today: Doing well overall. Tolerating medications. Taking statin every day and tolerating, since April. Taking time to relax,  working [place] than prior.  Takes BP only occasionally. No other recent numbers. Was a little upset this AM with a truck bill, feels like this is why his diastolic number was high.   Diabetes is difficult to control, following closely with Dr. Charlett Blake. Last A1c >12. Sugar yesterday was 478. Taking medications religiously. Neuropathy terrible in his feet, no wounds. Has a hard time eating healthy, doesn't like vegetables. Eats a lot of meat, loves potatoes. Trying to avoid sweets as much as he can.  Still smoking, no change. Not interested in quitting at this time.  Denies chest pain, shortness of breath at rest or with normal exertion. No PND, orthopnea, LE edema or unexpected weight gain. No syncope or palpitations.  Past Medical History:  Diagnosis Date  . Arthritis    DDD  . Bladder cancer ([place])   . Bladder cancer ([place]) 08/17/2018  . CAD in native artery 08/17/2018  . Current nicotine use 08/17/2018  . Diabetes mellitus without complication ([place])   . GERD (gastroesophageal reflux disease)   . H/O: stroke   . Heart attack ([place])    2 stents  . Hx of hemorrhoids 08/17/2018  . Hyperlipidemia   . Hypertension   . Peripheral neuropathy 08/17/2018  . Stroke ([place]) 57  . Visual changes 08/17/2018    Past Surgical History:  Procedure Laterality Date  . BLADDER TUMOR EXCISION     cancer  . CAROTID ENDARTERECTOMY Left   . CORONARY ANGIOPLASTY WITH STENT PLACEMENT     2 stent  . CORONARY STENT INTERVENTION N/A 09/02/2018   Procedure: CORONARY  STENT INTERVENTION;  Surgeon: Sherren Mocha, MD;  Location: [place] [facility];  Service: Cardiovascular;  Laterality: N/A;  . HERNIA REPAIR     right inguinal 2017  . LEFT HEART CATH AND CORONARY ANGIOGRAPHY N/A 09/02/2018   Procedure: LEFT HEART CATH AND CORONARY ANGIOGRAPHY;  Surgeon: Sherren Mocha, MD;  Location: [place] [facility];  Service: Cardiovascular;  Laterality: N/A;    Current Medications: Current Outpatient Medications on File Prior  to Visit  Medication Sig  . aspirin EC 81 MG tablet Take 1 tablet (81 mg total) by mouth daily.  . carvedilol (COREG) 3.125 MG tablet Take 3.125 mg by mouth 2 (two) times daily.  . clopidogrel (PLAVIX) 75 MG tablet TAKE 1 TABLET BY MOUTH EVERY DAY  . ezetimibe (ZETIA) 10 MG tablet Take 1 tablet (10 mg total) by mouth daily.  [name] gabapentin (NEURONTIN) 100 MG capsule Take 1-3 capsules (100-300 mg total) by mouth at bedtime.  [name] glipiZIDE (GLUCOTROL XL) 5 MG 24 hr tablet Take 1 tablet (5 mg total) by mouth daily with breakfast.  . glucose blood (ONETOUCH ULTRA) test strip USE TWICE DAILY AS DIRECTED ( E11.65)  . metFORMIN (GLUCOPHAGE) 1000 MG tablet TAKE 1 TABLET BY MOUTH TWICE A DAY  . nitroGLYCERIN (NITROSTAT) 0.4 MG SL tablet Place 0.4 mg under the tongue every 5 (five) minutes as needed for chest pain.  . rosuvastatin (CRESTOR) 5 MG tablet Take 5 mg by mouth daily.  . Saw Palmetto, Serenoa repens, (SAW PALMETTO PO) Take 1 capsule by mouth daily as needed (When having prostate problems).    No current facility-administered medications on file prior to visit.     Allergies:   Statins and Morphine   Social History   Tobacco Use  . Smoking status: Current Every Day Smoker  . Smokeless tobacco: Current User  Vaping Use  . Vaping Use: Never used  Substance Use Topics  . Alcohol use: Not Currently  . Drug use: Not Currently    Family History: The patient's family history includes Addison's disease in his sister; Arthritis in his father and mother; COPD in his mother; Dementia in his maternal grandfather, maternal grandmother, and mother; Diabetes in his brother, father, maternal grandfather, maternal grandmother, mother, and sister; Heart disease in his father, maternal grandfather, maternal grandmother, and mother; Hyperlipidemia in his brother, father, maternal grandfather, maternal grandmother, and sister; Hypertension in his brother and sister; Obesity in his brother, brother, and sister;  Osteoporosis in his father and mother; Other in his paternal grandmother; Parkinson's disease in his father; Prostate cancer in his paternal grandfather; Rheumatic fever in his son; Stickler syndrome in his son.  ROS:   Please see the history of present illness.  Additional pertinent ROS otherwise unremarkable.  EKGs/Labs/Other Studies Reviewed:    The following studies were reviewed today: Cath as above  EKG:  EKG is personally reviewed.  The ekg ordered 09/13/2018 demonstrates NSR  Recent Labs: 08/23/2018: Hemoglobin 16.7; Platelets 290 05/01/2019: ALT 12; BUN 18; Creatinine, Ser 0.88; Potassium 5.1; Sodium 132  Recent Lipid Panel    Component Value Date/Time   CHOL 177 05/01/2019 1108   TRIG 300 (H) 05/01/2019 1108   HDL 34 (L) 05/01/2019 1108   CHOLHDL 5.2 (H) 05/01/2019 1108   LDLCALC 93 05/01/2019 1108    Physical Exam:    VS:  BP (!) 124/100   Pulse 73   Ht 5\' 4"  (1.626 m)   Wt 140 lb (63.5 kg)   BMI 24.03 kg/m  Wt Readings from Last 3 Encounters:  07/28/19 140 lb (63.5 kg)  05/01/19 147 lb (66.7 kg)  02/16/19 152 lb (68.9 kg)    VITAL SIGNS:  reviewed GEN:  no acute distress EYES:  sclerae anicteric, EOMI - Extraocular Movements Intact RESPIRATORY:  normal respiratory effort, symmetric expansion CARDIOVASCULAR:  no visible JVD SKIN:  no rash, lesions or ulcers. MUSCULOSKELETAL:  no obvious deformities. NEURO:  alert and oriented x 3, no obvious focal deficit PSYCH:  normal affect  ASSESSMENT:    1. Coronary artery disease involving native coronary artery of native heart without angina pectoris   2. Medication management   3. Mixed hyperlipidemia   4. Status post angioplasty with stent   5. Essential hypertension   6. Tobacco abuse   7. Bilateral carotid artery disease, unspecified type ([place])   8. Type 2 diabetes mellitus without obesity ([place])   9. Counseling on health promotion and disease prevention   10. Cardiac risk counseling   11. Tobacco abuse  counseling    PLAN:    History of CAD, s/p 2 additional stents 08/2018:  -no further chest pain -continue aspirin 81 mg -continue clopidogrel 12 months post PCI -tolerating carvedilol 3.125 mg BID -counseled on red flag warning signs that need immediate medical attention -has PRN SL NG  Hypercholesterolemiaplus possible hypertriglyceridemia: LDL goal <70.  -intolerant of atorvastatin, had whole body hives(though this last >1 mos after stopping statin)and joint pain -tolerating daily rosuvastatin. Recheck labs  Hypertension:goal <130/80 -continue carvedilol for now. He does not want to add medications if this can be avoided. -counseled on how to check BP -instructed on when to call.  Carotid stenosis s/p R CEA: -lipid control, tobacco cessation, aspirin as above for ASCVD  Continues tobacco abuse: not interested in quitting at this time, discussed  Type II diabetes, without obesity: -has had significant difficulty with control. A1c/sugars up. Working closely with Dr. Charlett Blake. Struggling with low carb eating. -has type II diabetes and CAD. We have discussed SGLT2i and [place] previously. He wants to avoid high cost meds. If these become affordable for him, readdress  CV risk and secondary prevention -recommend heart healthy/Mediterranean diet, with whole grains, fruits, vegetable, fish, lean meats, nuts, and olive oil. Limit salt. -recommend moderate walking, 3-5 times/week for 30-50 minutes each session. Aim for at least 150 minutes.week. Goal should be pace of 3 miles/hours, or walking 1.5 miles in 30 minutes -recommend avoidance of tobacco products. Avoid excess alcohol.  Plan for follow up: 3 mos  Today, I have spent 13 minutes with the patient with telehealth technology discussing the above problems.  Additional time spent in chart review, documentation, and communication.  Medication Adjustments/Labs and Tests Ordered: Current medicines are reviewed at length with the  patient today.  Concerns regarding medicines are outlined above.  Orders Placed This Encounter  Procedures  . Lipid panel   No orders of the defined types were placed in this encounter.   Patient Instructions  Medication Instructions:  Your Physician recommend you continue on your current medication as directed.    *If you need a refill on your cardiac medications before your next appointment, please call your pharmacy*   Lab Work: Your physician recommends that you return for lab work (fasting lipid).  If you have labs (blood work) drawn today and your tests are completely normal, you will receive your results only by: [name] MyChart Message (if you have MyChart) OR . A paper copy in the mail If you have any  lab test that is abnormal or we need to change your treatment, we will call you to review the results.   Testing/Procedures: None   Follow-Up: At [facility], you and your health needs are our priority.  As part of our continuing mission to provide you with exceptional heart care, we have created designated Provider Care Teams.  These Care Teams include your primary Cardiologist (physician) and Advanced Practice Providers (APPs -  Physician Assistants and Nurse Practitioners) who all work together to provide you with the care you need, when you need it.  We recommend signing up for the patient portal called "MyChart".  Sign up information is provided on this After Visit Summary.  MyChart is used to connect with patients for Virtual Visits (Telemedicine).  Patients are able to view lab/test results, encounter notes, upcoming appointments, etc.  Non-urgent messages can be sent to your provider as well.   To learn more about what you can do with MyChart, go to [email or web address].    Your next appointment:   3 month(s)  The format for your next appointment:   Virtual Visit   Provider:   Buford Dresser, MD        Signed, Buford Dresser, MD  PhD 07/28/2019   [place]

## 2019-07-28 NOTE — Patient Instructions (Signed)
Medication Instructions:  Your Physician recommend you continue on your current medication as directed.    *If you need a refill on your cardiac medications before your next appointment, please call your pharmacy*   Lab Work: Your physician recommends that you return for lab work (fasting lipid).  If you have labs (blood work) drawn today and your tests are completely normal, you will receive your results only by: [name] MyChart Message (if you have MyChart) OR . A paper copy in the mail If you have any lab test that is abnormal or we need to change your treatment, we will call you to review the results.   Testing/Procedures: None   Follow-Up: At [facility], you and your health needs are our priority.  As part of our continuing mission to provide you with exceptional heart care, we have created designated Provider Care Teams.  These Care Teams include your primary Cardiologist (physician) and Advanced Practice Providers (APPs -  Physician Assistants and Nurse Practitioners) who all work together to provide you with the care you need, when you need it.  We recommend signing up for the patient portal called "MyChart".  Sign up information is provided on this After Visit Summary.  MyChart is used to connect with patients for Virtual Visits (Telemedicine).  Patients are able to view lab/test results, encounter notes, upcoming appointments, etc.  Non-urgent messages can be sent to your provider as well.   To learn more about what you can do with MyChart, go to [email or web address].    Your next appointment:   3 month(s)  The format for your next appointment:   Virtual Visit   Provider:   Buford Dresser, MD

## 2019-07-31 ENCOUNTER — Encounter: Payer: Self-pay | Admitting: Cardiology

## 2019-08-08 ENCOUNTER — Encounter: Payer: Medicare Other | Admitting: Family Medicine

## 2019-08-09 ENCOUNTER — Telehealth: Payer: Self-pay

## 2019-08-09 NOTE — Telephone Encounter (Signed)
Caller missed a call from Dr. Charlett Blake office / no message left. Telephone: [phone]

## 2019-08-09 NOTE — Telephone Encounter (Signed)
Left detailed message on machine.  It looks like it was just a reminder call for his appt on 8/6 for his cpe.

## 2019-08-11 ENCOUNTER — Encounter: Payer: Self-pay | Admitting: Medical

## 2019-08-11 ENCOUNTER — Other Ambulatory Visit: Payer: Self-pay | Admitting: Medical

## 2019-08-11 ENCOUNTER — Other Ambulatory Visit: Payer: Self-pay

## 2019-08-11 ENCOUNTER — Ambulatory Visit (INDEPENDENT_AMBULATORY_CARE_PROVIDER_SITE_OTHER): Payer: Medicare Other | Admitting: Medical

## 2019-08-11 VITALS — BP 105/70 | HR 70 | Resp 18 | Ht 64.0 in | Wt 141.0 lb

## 2019-08-11 DIAGNOSIS — Z125 Encounter for screening for malignant neoplasm of prostate: Secondary | ICD-10-CM

## 2019-08-11 DIAGNOSIS — Z0001 Encounter for general adult medical examination with abnormal findings: Secondary | ICD-10-CM

## 2019-08-11 DIAGNOSIS — Z0000 Encounter for general adult medical examination without abnormal findings: Secondary | ICD-10-CM

## 2019-08-11 DIAGNOSIS — R252 Cramp and spasm: Secondary | ICD-10-CM | POA: Diagnosis not present

## 2019-08-11 DIAGNOSIS — E119 Type 2 diabetes mellitus without complications: Secondary | ICD-10-CM

## 2019-08-11 DIAGNOSIS — R350 Frequency of micturition: Secondary | ICD-10-CM

## 2019-08-11 DIAGNOSIS — I959 Hypotension, unspecified: Secondary | ICD-10-CM

## 2019-08-11 DIAGNOSIS — G6289 Other specified polyneuropathies: Secondary | ICD-10-CM | POA: Diagnosis not present

## 2019-08-11 LAB — COMPREHENSIVE METABOLIC PANEL
ALT: 12 U/L (ref 0–53)
AST: 10 U/L (ref 0–37)
Albumin: 4.4 g/dL (ref 3.5–5.2)
Alkaline Phosphatase: 69 U/L (ref 39–117)
BUN: 17 mg/dL (ref 6–23)
CO2: 29 mEq/L (ref 19–32)
Calcium: 9.8 mg/dL (ref 8.4–10.5)
Chloride: 97 mEq/L (ref 96–112)
Creatinine, Ser: 0.87 mg/dL (ref 0.40–1.50)
GFR: 89.97 mL/min (ref >60.00)
Glucose, Bld: 324 mg/dL — ABNORMAL HIGH (ref 70–99)
Potassium: 4.7 mEq/L (ref 3.5–5.1)
Sodium: 132 mEq/L — ABNORMAL LOW (ref 135–145)
Total Bilirubin: 0.8 mg/dL (ref 0.2–1.2)
Total Protein: 7.1 g/dL (ref 6.0–8.3)

## 2019-08-11 LAB — CBC WITH DIFFERENTIAL/PLATELET
Basophils Absolute: 0 10*3/uL (ref 0.0–0.1)
Basophils Relative: 0.5 % (ref 0.0–3.0)
Eosinophils Absolute: 0.2 10*3/uL (ref 0.0–0.7)
Eosinophils Relative: 2.2 % (ref 0.0–5.0)
HCT: 45.9 % (ref 39.0–52.0)
Hemoglobin: 15.8 g/dL (ref 13.0–17.0)
Lymphocytes Relative: 37.8 % (ref 12.0–46.0)
Lymphs Abs: 3.3 10*3/uL (ref 0.7–4.0)
MCHC: 34.5 g/dL (ref 30.0–36.0)
MCV: 89.2 fl (ref 78.0–100.0)
Monocytes Absolute: 0.6 10*3/uL (ref 0.1–1.0)
Monocytes Relative: 7 % (ref 3.0–12.0)
Neutro Abs: 4.5 10*3/uL (ref 1.4–7.7)
Neutrophils Relative %: 52.5 % (ref 43.0–77.0)
Platelets: 234 10*3/uL (ref 150.0–400.0)
RBC: 5.15 Mil/uL (ref 4.22–5.81)
RDW: 13.2 % (ref 11.5–15.5)
WBC: 8.6 10*3/uL (ref 4.0–10.5)

## 2019-08-11 LAB — LIPID PANEL
Cholesterol: 160 mg/dL (ref 0–200)
HDL: 39.1 mg/dL (ref >39.00)
NonHDL: 121.28
Total CHOL/HDL Ratio: 4
Triglycerides: 202 mg/dL — ABNORMAL HIGH (ref 0.0–149.0)
VLDL: 40.4 mg/dL — ABNORMAL HIGH (ref 0.0–40.0)

## 2019-08-11 LAB — MAGNESIUM: Magnesium: 1.9 mg/dL (ref 1.5–2.5)

## 2019-08-11 LAB — LDL CHOLESTEROL, DIRECT: Direct LDL: 94 mg/dL

## 2019-08-11 LAB — POCT CBG (FASTING - GLUCOSE)-MANUAL ENTRY: Glucose Fasting, POC: 288 mg/dL — AB (ref 70–99)

## 2019-08-11 LAB — PSA: PSA: 0.19 ng/mL (ref 0.10–4.00)

## 2019-08-11 LAB — HEMOGLOBIN A1C: Hgb A1c MFr Bld: 14.1 % — ABNORMAL HIGH (ref 4.6–6.5)

## 2019-08-11 MED ORDER — SAXAGLIPTIN HCL 5 MG PO TABS: 5.0000 mg | ORAL_TABLET | Freq: Every day | ORAL | 3 refills | Status: DC

## 2019-08-11 MED ORDER — SITAGLIPTIN PHOSPHATE 100 MG PO TABS
100.0000 mg | ORAL_TABLET | Freq: Every day | ORAL | 3 refills | Status: DC
Start: 2019-08-11 — End: 2019-12-06

## 2019-08-11 MED ORDER — GABAPENTIN 100 MG PO CAPS: ORAL_CAPSULE | ORAL | 0 refills | Status: DC

## 2019-08-11 NOTE — Progress Notes (Signed)
Subjective:    Patient ID: Dean Singh, male    DOB: [birth year], 59 y.o.   MRN: [id number]  HPI  Pt in for cpe/wellness.  He is fasting.  Pt sugars have been 239-458. Last week was high 400's. He admits his diet is not good. Describes high sugar diet. Pt is on glipizide and on metformin.  Pt has not ever seen diabetic educator.  He also complain of neuropathy on both of his feet. He is taking gabapentin 300 mg at night. Pt does not think it is sedating.  Also some feet cramping.  Pt initially had low bp. Pt bp is not usually low. No syncope. No dizziness. No light headed when standing.  Pt has never had colonoscopy. Pt declines.  No family hx of colon. No hx of colon polyps. Offered cologuard.     Review of Systems  Constitutional: Negative for chills, fatigue and fever.  Respiratory: Negative for cough, chest tightness, shortness of breath and wheezing.   Cardiovascular: Negative for chest pain and palpitations.  Gastrointestinal: Negative for abdominal pain, anal bleeding, blood in stool, nausea and vomiting.  Musculoskeletal: Negative for back pain.  Skin: Negative for rash.  Neurological: Negative for dizziness, facial asymmetry and light-headedness.  Hematological: Negative for adenopathy. Does not bruise/bleed easily.  Psychiatric/Behavioral: Negative for behavioral problems and confusion.    Past Medical History:  Diagnosis Date  . Arthritis    DDD  . Bladder cancer ([place])   . Bladder cancer ([place]) 08/17/2018  . CAD in native artery 08/17/2018  . Current nicotine use 08/17/2018  . Diabetes mellitus without complication ([place])   . GERD (gastroesophageal reflux disease)   . H/O: stroke   . Heart attack ([place])    2 stents  . Hx of hemorrhoids 08/17/2018  . Hyperlipidemia   . Hypertension   . Peripheral neuropathy 08/17/2018  . Stroke ([place]) 57  . Visual changes 08/17/2018     Social History   Socioeconomic History  . Marital status: Married    Spouse name:  Not on file  . Number of children: Not on file  . Years of education: Not on file  . Highest education level: Not on file  Occupational History  . Not on file  Tobacco Use  . Smoking status: Current Every Day Smoker  . Smokeless tobacco: Current User  Vaping Use  . Vaping Use: Never used  Substance and Sexual Activity  . Alcohol use: Not Currently  . Drug use: Not Currently  . Sexual activity: Yes  Other Topics Concern  . 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 Difficulty of Paying Living Expenses:   Food Insecurity:   . Worried About Charity fundraiser in the Last Year:   . Arboriculturist in the Last Year:   Transportation Needs:   . Film/video editor (Medical):   [name] Lack of Transportation (Non-Medical):   Physical Activity:   . Days of Exercise per Week:   . Minutes of Exercise per Session:   Stress:   . Feeling of Stress :   Social Connections:   . Frequency of Communication with Friends and Family:   . Frequency of Social Gatherings with Friends and Family:   . Attends Religious Services:   .  Active Member of Clubs or Organizations:   . Attends Archivist Meetings:   [name] Marital Status:   Intimate Partner Violence:   . Fear of Current or Ex-Partner:   . Emotionally Abused:   [name] Physically Abused:   . Sexually Abused:     Past Surgical History:  Procedure Laterality Date  . BLADDER TUMOR EXCISION     cancer  . CAROTID ENDARTERECTOMY Left   . CORONARY ANGIOPLASTY WITH STENT PLACEMENT     2 stent  . CORONARY STENT INTERVENTION N/A 09/02/2018   Procedure: CORONARY STENT INTERVENTION;  Surgeon: Sherren Mocha, MD;  Location: [place] [facility];  Service: Cardiovascular;  Laterality: N/A;  . HERNIA REPAIR     right inguinal 2017    . LEFT HEART CATH AND CORONARY ANGIOGRAPHY N/A 09/02/2018   Procedure: LEFT HEART CATH AND CORONARY ANGIOGRAPHY;  Surgeon: Sherren Mocha, MD;  Location: [place] [facility];  Service: Cardiovascular;  Laterality: N/A;    Family History  Problem Relation Age of Onset  . COPD Mother   . Diabetes Mother   . Dementia Mother   . Heart disease Mother        chf  . Arthritis Mother   . Osteoporosis Mother        h/o cigarette smoking  . Heart disease Father        stents 3 x 2, pacer, chf  . Parkinson's disease Father   . Hyperlipidemia Father   . Diabetes Father   . Arthritis Father        DDD  . Osteoporosis Father        h/o cigarette use  . Addison's disease Sister   . Diabetes Sister   . Hyperlipidemia Sister   . Hypertension Sister   . Obesity Sister   . Hyperlipidemia Brother   . Hypertension Brother   . Diabetes Brother   . Obesity Brother   . Rheumatic fever Son   . Stickler syndrome Son   . Diabetes Maternal Grandmother   . Dementia Maternal Grandmother   . Heart disease Maternal Grandmother        chf  . Hyperlipidemia Maternal Grandmother   . Diabetes Maternal Grandfather   . Dementia Maternal Grandfather   . Heart disease Maternal Grandfather        cad  . Hyperlipidemia Maternal Grandfather   . Other Paternal Grandmother        scarlet fever  . Prostate cancer Paternal Grandfather   . Obesity Brother     Allergies  Allergen Reactions  . Statins Itching    Atorvastatin cause "my skin to feel horrible and unbearable".  . Morphine Nausea And Vomiting    "felt like chest was exploding" pt reports     Current Outpatient Medications on File Prior to Visit  Medication Sig Dispense Refill  . aspirin EC 81 MG tablet Take 1 tablet (81 mg total) by mouth daily.    . carvedilol (COREG) 3.125 MG tablet Take 3.125 mg by mouth 2 (two) times daily.    . clopidogrel (PLAVIX) 75 MG tablet TAKE 1 TABLET BY MOUTH EVERY DAY 30 tablet 10  . gabapentin (NEURONTIN)  100 MG capsule Take 1-3 capsules (100-300 mg total) by mouth at bedtime. 270 capsule 0  . glipiZIDE (GLUCOTROL XL) 5 MG 24 hr tablet Take 1 tablet (5 mg total) by mouth daily with breakfast. 90 tablet 0  . glucose blood (ONETOUCH ULTRA) test strip USE TWICE DAILY AS DIRECTED (  E11.65)    . metFORMIN (GLUCOPHAGE) 1000 MG tablet TAKE 1 TABLET BY MOUTH TWICE A DAY 180 tablet 0  . nitroGLYCERIN (NITROSTAT) 0.4 MG SL tablet Place 0.4 mg under the tongue every 5 (five) minutes as needed for chest pain.    . pantoprazole (PROTONIX) 40 MG tablet Take 1 tablet (40 mg total) by mouth daily. 90 tablet 3  . rosuvastatin (CRESTOR) 5 MG tablet Take 5 mg by mouth daily.    . Saw Palmetto, Serenoa repens, (SAW PALMETTO PO) Take 1 capsule by mouth daily as needed (When having prostate problems).     . ezetimibe (ZETIA) 10 MG tablet Take 1 tablet (10 mg total) by mouth daily. 90 tablet 3   No current facility-administered medications on file prior to visit.    BP (!) 95/56   Pulse 70   Resp 18   Ht 5\' 4"  (1.626 m)   Wt 141 lb (64 kg)   SpO2 99%   BMI 24.20 kg/m       Objective:   Physical Exam  General Mental Status- Alert. General Appearance- Not in acute distress.   Skin General: Color- Normal Color. Moisture- Normal Moisture.  Neck Carotid Arteries- Normal color. Moisture- Normal Moisture. No carotid bruits. No JVD.  Chest and Lung Exam Auscultation: Breath Sounds:-Normal.  Cardiovascular Auscultation:Rythm- Regular. Murmurs & Other Heart Sounds:Auscultation of the heart reveals- No Murmurs.  Abdomen Inspection:-Inspeection Normal. Palpation/Percussion:Note:No mass. Palpation and Percussion of the abdomen reveal- Non Tender, Non Distended + BS, no rebound or guarding.    Neurologic Cranial Nerve exam:- CN III-XII intact(No nystagmus), symmetric smile. Strength:- 5/5 equal and symmetric strength both upper and lower extremities. Lower ext- diabetic foot exam. But foot very tender  to light touch.      Assessment & Plan:  For cramping follow cmp and mag level.  For uncontrolled diabetes adding januvia, referring to diabetic education and endocrinologist. Check sugar at least twice a day additional checks if symptomatic. If over 400s and severe symtpoms then ED evaluation. If over 400 not symptomatic pending endocrine eval call and let us know.  For neuropathy rx gabapentin. Dose change. rx advisement.  Asked hannah to order cologuard for pt.  Screening psa placed.  Recommend exercise and healthy diet.  We will let you know lab results as they come in.  Follow up date appointment will be determined after lab review.   Mackie Pai, [place]   [phone] charge in addition to wellness. As addressed diabetes, neuropathy and cramping.

## 2019-08-11 NOTE — Patient Instructions (Addendum)
For you wellness exam today I have ordered cbc, cmp, lipid panel and a1.  For cramping follow cmp and mag level.  For uncontrolled diabetes adding januvia, referring to diabetic education and endocrinologist. Check sugar at least twice a day additional checks if symptomatic. If over 400s and severe symtpoms then ED evaluation. If over 400 not symptomatic pending endocrine eval call and let us know.  For neuropathy rx gabapentin. Dose change. rx advisement.  Asked hannah to order cologuard for pt.  Screening psa placed.  Recommend exercise and healthy diet.  We will let you know lab results as they come in.  For recent low bp, I do want you to check you check your bp daily for next 2-3 days. If bp is lower then need to address or if you start getting light headed standing up let me know. Give me update on those readings in 3-4 days.  Follow up date appointment will be determined after lab review.    Preventive Care 66-82 Years Old, Male Preventive care refers to lifestyle choices and visits with your health care provider that can promote health and wellness. This includes:  A yearly physical exam. This is also called an annual well check.  Regular dental and eye exams.  Immunizations.  Screening for certain conditions.  Healthy lifestyle choices, such as eating a healthy diet, getting regular exercise, not using drugs or products that contain nicotine and tobacco, and limiting alcohol use. What can I expect for my preventive care visit? Physical exam Your health care provider will check:  Height and weight. These may be used to calculate body mass index (BMI), which is a measurement that tells if you are at a healthy weight.  Heart rate and blood pressure.  Your skin for abnormal spots. Counseling Your health care provider may ask you questions about:  Alcohol, tobacco, and drug use.  Emotional well-being.  Home and relationship well-being.  Sexual  activity.  Eating habits.  Work and work Statistician. What immunizations do I need?  Influenza (flu) vaccine  This is recommended every year. Tetanus, diphtheria, and pertussis (Tdap) vaccine  You may need a Td booster every 10 years. Varicella (chickenpox) vaccine  You may need this vaccine if you have not already been vaccinated. Zoster (shingles) vaccine  You may need this after age 13. Measles, mumps, and rubella (MMR) vaccine  You may need at least one dose of MMR if you were born in 1957 or later. You may also need a second dose. Pneumococcal conjugate (PCV13) vaccine  You may need this if you have certain conditions and were not previously vaccinated. Pneumococcal polysaccharide (PPSV23) vaccine  You may need one or two doses if you smoke cigarettes or if you have certain conditions. Meningococcal conjugate (MenACWY) vaccine  You may need this if you have certain conditions. Hepatitis A vaccine  You may need this if you have certain conditions or if you travel or work in places where you may be exposed to hepatitis A. Hepatitis B vaccine  You may need this if you have certain conditions or if you travel or work in places where you may be exposed to hepatitis B. Haemophilus influenzae type b (Hib) vaccine  You may need this if you have certain risk factors. Human papillomavirus (HPV) vaccine  If recommended by your health care provider, you may need three doses over 6 months. You may receive vaccines as individual doses or as more than one vaccine together in one shot (combination vaccines). Talk  with your health care provider about the risks and benefits of combination vaccines. What tests do I need? Blood tests  Lipid and cholesterol levels. These may be checked every 5 years, or more frequently if you are over 60 years old.  Hepatitis C test.  Hepatitis B test. Screening  Lung cancer screening. You may have this screening every year starting at age 49 if  you have a 30-pack-year history of smoking and currently smoke or have quit within the past 15 years.  Prostate cancer screening. Recommendations will vary depending on your family history and other risks.  Colorectal cancer screening. All adults should have this screening starting at age 75 and continuing until age 106. Your health care provider may recommend screening at age 78 if you are at increased risk. You will have tests every 1-10 years, depending on your results and the type of screening test.  Diabetes screening. This is done by checking your blood sugar (glucose) after you have not eaten for a while (fasting). You may have this done every 1-3 years.  Sexually transmitted disease (STD) testing. Follow these instructions at home: Eating and drinking  Eat a diet that includes fresh fruits and vegetables, whole grains, lean protein, and low-fat dairy products.  Take vitamin and mineral supplements as recommended by your health care provider.  Do not drink alcohol if your health care provider tells you not to drink.  If you drink alcohol: ? Limit how much you have to 0-2 drinks a day. ? Be aware of how much alcohol is in your drink. In the [place] one drink equals one 12 oz bottle of beer (355 mL), one 5 oz glass of wine (148 mL), or one 1 oz glass of hard liquor (44 mL). Lifestyle  Take daily care of your teeth and gums.  Stay active. Exercise for at least 30 minutes on 5 or more days each week.  Do not use any products that contain nicotine or tobacco, such as cigarettes, e-cigarettes, and chewing tobacco. If you need help quitting, ask your health care provider.  If you are sexually active, practice safe sex. Use a condom or other form of protection to prevent STIs (sexually transmitted infections).  Talk with your health care provider about taking a low-dose aspirin every day starting at age 24. What's next?  Go to your health care provider once a year for a well check  visit.  Ask your health care provider how often you should have your eyes and teeth checked.  Stay up to date on all vaccines. This information is not intended to replace advice given to you by your health care provider. Make sure you discuss any questions you have with your health care provider. Document Revised: 12/16/2017 Document Reviewed: 12/16/2017 Elsevier Patient Education  2020 [employer].

## 2019-08-12 LAB — MICROALBUMIN, URINE: Microalb, Ur: 2.1 mg/dL

## 2019-08-23 ENCOUNTER — Encounter: Payer: Self-pay | Admitting: Family Medicine

## 2019-08-23 MED ORDER — FREESTYLE LIBRE 14 DAY SENSOR MISC: 5 refills | Status: DC

## 2019-08-23 MED ORDER — FREESTYLE LIBRE 14 DAY READER DEVI: 0 refills | Status: DC

## 2019-08-23 MED ORDER — ONETOUCH ULTRA VI STRP: ORAL_STRIP | 12 refills | Status: AC

## 2019-08-30 ENCOUNTER — Other Ambulatory Visit: Payer: Self-pay

## 2019-08-30 ENCOUNTER — Encounter: Payer: Self-pay | Admitting: Internal Medicine

## 2019-08-30 ENCOUNTER — Encounter: Payer: Self-pay | Admitting: Family Medicine

## 2019-08-30 ENCOUNTER — Ambulatory Visit (INDEPENDENT_AMBULATORY_CARE_PROVIDER_SITE_OTHER): Payer: Medicare Other | Admitting: Internal Medicine

## 2019-08-30 VITALS — BP 110/60 | HR 60 | Ht 64.0 in | Wt 146.6 lb

## 2019-08-30 DIAGNOSIS — E1159 Type 2 diabetes mellitus with other circulatory complications: Secondary | ICD-10-CM | POA: Diagnosis not present

## 2019-08-30 DIAGNOSIS — E785 Hyperlipidemia, unspecified: Secondary | ICD-10-CM | POA: Diagnosis not present

## 2019-08-30 DIAGNOSIS — E1142 Type 2 diabetes mellitus with diabetic polyneuropathy: Secondary | ICD-10-CM

## 2019-08-30 DIAGNOSIS — E1165 Type 2 diabetes mellitus with hyperglycemia: Secondary | ICD-10-CM | POA: Diagnosis not present

## 2019-08-30 LAB — POCT GLUCOSE (DEVICE FOR HOME USE): POC Glucose: 235 mg/dl — AB (ref 70–99)

## 2019-08-30 MED ORDER — BASAGLAR KWIKPEN 100 UNIT/ML ~~LOC~~ SOPN: 12.0000 [IU] | PEN_INJECTOR | Freq: Every day | SUBCUTANEOUS | 6 refills | Status: DC

## 2019-08-30 MED ORDER — INSULIN PEN NEEDLE 32G X 4 MM MISC: 1.0000 | Freq: Every day | 6 refills | Status: DC

## 2019-08-30 NOTE — Patient Instructions (Addendum)
-   STOP Glipizide  - Start Basaglar 12 units daily  - Continue Metformin 1000 mg , twice daily  - Continue Januvia 100 mg daily       Choose healthy, lower carb lower calorie snacks: toss salad, cooked vegetables, cottage cheese, peanut butter, low fat cheese / string cheese, lower sodium deli meat, tuna salad or chicken salad    HOW TO TREAT LOW BLOOD SUGARS (Blood sugar LESS THAN 70 MG/DL)  Please follow the RULE OF 15 for the treatment of hypoglycemia treatment (when your (blood sugars are less than 70 mg/dL)    STEP 1: Take 15 grams of carbohydrates when your blood sugar is low, which includes:   3-4 GLUCOSE TABS  OR  3-4 OZ OF JUICE OR REGULAR SODA OR  ONE TUBE OF GLUCOSE GEL     STEP 2: RECHECK blood sugar in 15 MINUTES STEP 3: If your blood sugar is still low at the 15 minute recheck --> then, go back to STEP 1 and treat AGAIN with another 15 grams of carbohydrates.

## 2019-08-30 NOTE — Progress Notes (Signed)
Name: Dean Singh. Gloss  MRN/ DOB: [id number], [birth year]   Age/ Sex: 59 y.o., male    PCP: Mosie Lukes, MD   Reason for Endocrinology Evaluation: Type 2 Diabetes Mellitus     Date of Initial Endocrinology Visit: 08/30/2019     PATIENT IDENTIFIER: [name] is a 59 y.o. male with a past medical history of HTN, T2DM and CAD. The patient presented for initial endocrinology clinic visit on 08/30/2019 for consultative assistance with his diabetes management.    HPI: Dean Singh was    Diagnosed with DM 2018 Prior Medications tried/Intolerance: as listed . Januvia started recently  Currently checking blood sugars 2 x / day,  before breakfast and bedtime Hypoglycemia episodes : no               Hemoglobin A1c has ranged from 9.3% in 2020, peaking at 14.1% in 2021 Patient required assistance for hypoglycemia: no Patient has required hospitalization within the last 1 year from hyper or hypoglycemia: no  In terms of diet, the patient eats 2 meals a day, snacks regularly . Avoids sugar-sweetened beverages    Pt with leg pains  No nausea or vomiting    HOME DIABETES REGIMEN: Glipizide 5 mg XL Metformin 1000 mg BID Januvia 100 mg daily   Statin: Yes ACE-I/ARB: no Prior Diabetic Education: no   GLUCOSE LOG: 198- 309 mg/dL    DIABETIC COMPLICATIONS: Microvascular complications:   Neuropathy  Denies: CKD, retinopahty  Last eye exam: Completed yrs ago   Macrovascular complications:   CVA, CAD ( S/P stent replacement )  Denies: PVD   PAST HISTORY: Past Medical History:  Past Medical History:  Diagnosis Date  . Arthritis    DDD  . Bladder cancer ([place])   . Bladder cancer ([place]) 08/17/2018  . CAD in native artery 08/17/2018  . Current nicotine use 08/17/2018  . Diabetes mellitus without complication ([place])   . GERD (gastroesophageal reflux disease)   . H/O: stroke   . Heart attack ([place])    2 stents  . Hx of hemorrhoids 08/17/2018  . Hyperlipidemia   .  Hypertension   . Peripheral neuropathy 08/17/2018  . Stroke ([place]) 57  . Visual changes 08/17/2018   Past Surgical History:  Past Surgical History:  Procedure Laterality Date  . BLADDER TUMOR EXCISION     cancer  . CAROTID ENDARTERECTOMY Left   . CORONARY ANGIOPLASTY WITH STENT PLACEMENT     2 stent  . CORONARY STENT INTERVENTION N/A 09/02/2018   Procedure: CORONARY STENT INTERVENTION;  Surgeon: Sherren Mocha, MD;  Location: [facility];  Service: Cardiovascular;  Laterality: N/A;  . HERNIA REPAIR     right inguinal 2017  . LEFT HEART CATH AND CORONARY ANGIOGRAPHY N/A 09/02/2018   Procedure: LEFT HEART CATH AND CORONARY ANGIOGRAPHY;  Surgeon: Sherren Mocha, MD;  Location: [facility];  Service: Cardiovascular;  Laterality: N/A;      Social History:  reports that he has been smoking. He uses smokeless tobacco. He reports previous alcohol use. He reports previous drug use. Family History:  Family History  Problem Relation Age of Onset  . COPD Mother   . Diabetes Mother   . Dementia Mother   . Heart disease Mother        chf  . Arthritis Mother   . Osteoporosis Mother        h/o cigarette smoking  . Heart disease Father        stents  3 x 2, pacer, chf  . Parkinson's disease Father   . Hyperlipidemia Father   . Diabetes Father   . Arthritis Father        DDD  . Osteoporosis Father        h/o cigarette use  . Addison's disease Sister   . Diabetes Sister   . Hyperlipidemia Sister   . Hypertension Sister   . Obesity Sister   . Hyperlipidemia Brother   . Hypertension Brother   . Diabetes Brother   . Obesity Brother   . Rheumatic fever Son   . Stickler syndrome Son   . Diabetes Maternal Grandmother   . Dementia Maternal Grandmother   . Heart disease Maternal Grandmother        chf  . Hyperlipidemia Maternal Grandmother   . Diabetes Maternal Grandfather   . Dementia Maternal Grandfather   . Heart disease Maternal Grandfather        cad  .  Hyperlipidemia Maternal Grandfather   . Other Paternal Grandmother        scarlet fever  . Prostate cancer Paternal Grandfather   . Obesity Brother      HOME MEDICATIONS: Allergies as of 08/30/2019      Reactions   Statins Itching   Atorvastatin cause "my skin to feel horrible and unbearable".   Morphine Nausea And Vomiting   "felt like chest was exploding" pt reports      Medication List       Accurate as of August 30, 2019  3:06 PM. If you have any questions, ask your nurse or doctor.        STOP taking these medications   FreeStyle Libre 14 Day Reader Kerrin Mo Stopped by: Dorita Sciara, MD   FreeStyle Libre 14 Day Sensor Misc Stopped by: Dorita Sciara, MD     TAKE these medications   aspirin EC 81 MG tablet Take 1 tablet (81 mg total) by mouth daily.   carvedilol 3.125 MG tablet Commonly known as: COREG Take 3.125 mg by mouth 2 (two) times daily.   clopidogrel 75 MG tablet Commonly known as: PLAVIX TAKE 1 TABLET BY MOUTH EVERY DAY   ezetimibe 10 MG tablet Commonly known as: ZETIA Take 1 tablet (10 mg total) by mouth daily.   gabapentin 100 MG capsule Commonly known as: NEURONTIN 1 tab po early am, 1 tab po late afternoon and 4 tab at night   glipiZIDE 5 MG 24 hr tablet Commonly known as: GLUCOTROL XL Take 1 tablet (5 mg total) by mouth daily with breakfast.   metFORMIN 1000 MG tablet Commonly known as: GLUCOPHAGE TAKE 1 TABLET BY MOUTH TWICE A DAY   nitroGLYCERIN 0.4 MG SL tablet Commonly known as: NITROSTAT Place 0.4 mg under the tongue every 5 (five) minutes as needed for chest pain.   OneTouch Ultra test strip Generic drug: glucose blood USE TWICE DAILY AS DIRECTED ( E11.65)   pantoprazole 40 MG tablet Commonly known as: PROTONIX Take 1 tablet (40 mg total) by mouth daily.   rosuvastatin 5 MG tablet Commonly known as: CRESTOR Take 5 mg by mouth daily.   SAW PALMETTO PO Take 1 capsule by mouth daily as needed (When having  prostate problems).   sitaGLIPtin 100 MG tablet Commonly known as: Januvia Take 1 tablet (100 mg total) by mouth daily.        ALLERGIES: Allergies  Allergen Reactions  . Statins Itching    Atorvastatin cause "my skin to feel horrible and  unbearable".  . Morphine Nausea And Vomiting    "felt like chest was exploding" pt reports      REVIEW OF SYSTEMS: A comprehensive ROS was conducted with the patient and is negative except as per HPI    OBJECTIVE:   VITAL SIGNS: BP 110/60 (BP Location: Left Arm, Patient Position: Sitting, Cuff Size: Normal)   Pulse 60   Ht 5\' 4"  (1.626 m)   Wt 146 lb 9.6 oz (66.5 kg)   SpO2 94%   BMI 25.16 kg/m    PHYSICAL EXAM:  General: Pt appears well and is in NAD  Neck: General: Supple without adenopathy or carotid bruits. Thyroid: Thyroid size normal.  No goiter or nodules appreciated. No thyroid bruit.  Lungs: Clear with good BS bilat with no rales, rhonchi, or wheezes  Heart: RRR with normal S1 and S2 and no gallops; no murmurs; no rub  Abdomen: Normoactive bowel sounds, soft, nontender, without masses or organomegaly palpable  Extremities:  Lower extremities - No pretibial edema. No lesions.  Skin: Normal texture and temperature to palpation.  Neuro: MS is good with appropriate affect, pt is alert and Ox3     DATA REVIEWED:  Lab Results  Component Value Date   HGBA1C 14.1 (H) 08/11/2019   HGBA1C 12.8 (H) 05/01/2019   HGBA1C 9.3 (H) 08/23/2018   Lab Results  Component Value Date   MICROALBUR 2.1 08/11/2019   LDLCALC 93 05/01/2019   CREATININE 0.87 08/11/2019    Lab Results  Component Value Date   CHOL 160 08/11/2019   HDL 39.10 08/11/2019   LDLCALC 93 05/01/2019   LDLDIRECT 94.0 08/11/2019   TRIG 202.0 (H) 08/11/2019   CHOLHDL 4 08/11/2019        ASSESSMENT / PLAN / RECOMMENDATIONS:   1) Type 2 Diabetes Mellitus, Poorly controlled, With Neuropathic and macrovascular complications - Most recent A1c of 14.1 %. Goal A1c  < 7.0 %.    Plan: GENERAL: I have discussed with the patient the pathophysiology of diabetes. We went over the natural progression of the disease. We talked about both insulin resistance and insulin deficiency. We stressed the importance of lifestyle changes including diet and exercise. I explained the complications associated with diabetes including retinopathy, nephropathy, neuropathy as well as increased risk of cardiovascular disease. We went over the benefit seen with glycemic control.   I explained to the patient that diabetic patients are at higher than normal risk for amputations. The patient was informed that diabetes is the number one cause of non-traumatic amputations in [place]. The patient was advised to look and examine their feet , wear proper fitting shoes and not to go barefoot.   I have recommended insulin with an A1c > 10.0% which he agreed to, discussed risk of hypoglycemia. Pt was educated on insulin pen use in the office.   Will consider switching Januvia to Rybelsus or an SGLT-2 inhibitor on next visit   MEDICATIONS: - STOP Glipizide  - Start Basaglar 12 units daily  - Continue Metformin 1000 mg , twice daily  - Continue Januvia 100 mg daily    EDUCATION / INSTRUCTIONS:  BG monitoring instructions: Patient is instructed to check his blood sugars 2times a day, fasting and   Call [facility] if: BG persistently < 70  . I reviewed the Rule of 15 for the treatment of hypoglycemia in detail with the patient. Literature supplied.   2) Diabetic complications:   Eye: Unknown to  have known diabetic retinopathy. Pt urged  to have an eye exam   Neuro/ Feet: Does  have known diabetic peripheral neuropathy.  Renal: Patient does not have known baseline CKD. He is not on an ACEI/ARB at present.  3) Lipids: Patient is on rosuvastatin 5 mg daily . Above goal. We discussed low fat diet. Discussed cardiovascular benefits of statins       Signed  electronically by: Mack Guise, MD  [facility]  [facility] [address] [place], [place] [address] Phone: [phone] FAX: [phone]   CC: Mosie Lukes, [address] [place] [address] Phone: [phone]  Fax: [phone]    Return to Endocrinology clinic as below: Future Appointments  Date Time Provider [place]  10/02/2019  3:30 PM Clydell Hakim, RD [place] [facility]  10/30/2019  9:00 AM Buford Dresser, MD [facility]

## 2019-08-31 ENCOUNTER — Encounter: Payer: Self-pay | Admitting: Family Medicine

## 2019-08-31 ENCOUNTER — Other Ambulatory Visit: Payer: Self-pay

## 2019-08-31 ENCOUNTER — Encounter: Payer: Self-pay | Admitting: Internal Medicine

## 2019-08-31 MED ORDER — FREESTYLE LIBRE 14 DAY SENSOR MISC
2 refills | Status: DC
Start: 2019-08-31 — End: 2020-10-17

## 2019-08-31 MED ORDER — FREESTYLE LIBRE 14 DAY READER DEVI
0 refills | Status: DC
Start: 2019-08-31 — End: 2020-10-17

## 2019-09-01 DIAGNOSIS — E1165 Type 2 diabetes mellitus with hyperglycemia: Secondary | ICD-10-CM | POA: Insufficient documentation

## 2019-09-01 DIAGNOSIS — E119 Type 2 diabetes mellitus without complications: Secondary | ICD-10-CM | POA: Insufficient documentation

## 2019-09-01 DIAGNOSIS — E785 Hyperlipidemia, unspecified: Secondary | ICD-10-CM | POA: Insufficient documentation

## 2019-09-01 DIAGNOSIS — E1142 Type 2 diabetes mellitus with diabetic polyneuropathy: Secondary | ICD-10-CM | POA: Insufficient documentation

## 2019-09-13 ENCOUNTER — Encounter: Payer: Self-pay | Admitting: Family Medicine

## 2019-09-14 MED ORDER — ROSUVASTATIN CALCIUM 5 MG PO TABS: 5.0000 mg | ORAL_TABLET | Freq: Every day | ORAL | 2 refills | Status: DC

## 2019-09-28 ENCOUNTER — Other Ambulatory Visit: Payer: Self-pay | Admitting: Cardiology

## 2019-10-02 ENCOUNTER — Encounter: Payer: Medicare Other | Attending: Medical | Admitting: Dietician

## 2019-10-02 ENCOUNTER — Other Ambulatory Visit: Payer: Self-pay

## 2019-10-02 ENCOUNTER — Encounter: Payer: Self-pay | Admitting: Dietician

## 2019-10-02 DIAGNOSIS — E1159 Type 2 diabetes mellitus with other circulatory complications: Secondary | ICD-10-CM | POA: Diagnosis not present

## 2019-10-02 NOTE — Patient Instructions (Signed)
Consider getting your vitamin B-12 checked Consider quitting smoking. Stay as active as you are able. Limit snacking at night.  Choose 1 carb and 1 protein.  Choose baked meat rather than fried. Choose whole wheat bread or English Muffins rather than biscuits  Take your medicine consistently as prescribed.

## 2019-10-02 NOTE — Progress Notes (Signed)
Diabetes Self-Management Education  Visit Type: First/Initial  Appt. Start Time: 1545 Appt. End Time: 1700  10/03/2019  [name], identified by name and date of birth, is a 59 y.o. male with a diagnosis of Diabetes: Type 2.   ASSESSMENT Patient is here today alone.  Patient states that he wants to know what he can eat.  He dislikes and gags on most vegetables except green beans, corn, and peas.  Dislikes fruit except bananas, grapes, peaches. Intollerant to watermelon - stomach cramps No teeth.  Weight per patient 145 lbs today. 175 lbs 12/2018 138 lbs June 2021  History includes Type 2 diabetes, MI, neuropathy (severe per patient), HTN, HLD, GERD, smokes A1C  14.1% 08/11/2019 increased from 12.% 05/01/2019, cholesterol 160, HDL 39, LDL 94, Triglycerides 202 08/2019 Medications include:  Basaglar 12 units q am Fasting blood glucose 130-150 but increased to >200 the last couple of days.  Patient lives with his wife.  She does the shopping and cooking.   He eats a lot of take out.  He works in Architect and recently started delivering food for [employer]  He is gone working 5:30 am- after dark. Increased stress due to increased debt from failed restaurant due to covid. 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  Ate 72 Little [employer]bbie's per week when younger.  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  Severe neuropathy limits any physical activity.  Height 5\' 3"  (1.6 m), weight 145 lb (65.8 kg). Body mass index is 25.69 kg/m.   Diabetes Self-Management Education - 10/02/19 1611      Visit Information   Visit Type First/Initial      Initial Visit   Diabetes Type Type 2    Are you currently following a meal plan? No    Are you taking your medications as prescribed? Yes    Date Diagnosed 2015      Health Coping   How would you rate your overall health? Fair      Psychosocial Assessment   Patient Belief/Attitude about Diabetes Defeat/Burnout    Self-care barriers  Low literacy    Self-management support Doctor's office;Family    Other persons present Patient    Patient Concerns Nutrition/Meal planning;Glycemic Control    Special Needs None    Preferred Learning Style No preference indicated    Learning Readiness Ready    How often do you need to have someone help you when you read instructions, pamphlets, or other written materials from your doctor or pharmacy? 5 - Always    What is the last grade level you completed in school? 8th grade      Pre-Education Assessment   Patient understands the diabetes disease and treatment process. Needs Instruction    Patient understands incorporating nutritional management into lifestyle. Needs Instruction    Patient undertands incorporating physical activity into lifestyle. Needs Instruction    Patient understands using medications safely. Needs Instruction    Patient understands monitoring blood glucose, interpreting and using results Needs Instruction    Patient understands prevention, detection, and treatment of acute complications. Needs Instruction    Patient understands prevention, detection, and treatment of chronic complications. Needs Instruction    Patient understands how to develop strategies to address psychosocial issues. Needs Instruction    Patient understands how to develop strategies to promote health/change behavior. Needs Instruction      Complications   Last HgB A1C per patient/outside source 14.1 %   08/11/2019 increased from  12.8% 04/25/2019   How often do you check your blood sugar? 1-2 times/day    Fasting Blood glucose range (mg/dL) 130-179;70-129;180-200;>200   130-150 recently but > 200 today   Number of hypoglycemic episodes per month 0    Number of hyperglycemic episodes per week 7    Can you tell when your blood sugar is high? Yes    What do you do if your blood sugar is high? avoids sugar and increased carbs,  drinks a lot of water    Have you had a dilated eye exam in the past 12  months? No    Have you had a dental exam in the past 12 months? No    Are you checking your feet? Yes    How many days per week are you checking your feet? 2      Dietary Intake   Breakfast skips at times OR breakfast bowl (ham, egg, chees, potatoes) OR bacon, egg, and cheese biscuit   If I eat in the morning, I am hungry all day long.   Snack (morning) none    Lunch lasagne OR beef stew OR fast food OR PB sandwich, NABS   2-4   Snack (afternoon) none    Dinner beans, baked beans, potatoes, corn, biscuit    Snack (evening) chips and dip OR sugar free popsickle or sugar free ice cream or NABS   hungry   Beverage(s) water, sugar free root beer, gatorade zero      Exercise   Exercise Type Light (walking / raking leaves)   on the job     Patient Education   Previous Diabetes Education No    Disease state  Definition of diabetes, type 1 and 2, and the diagnosis of diabetes    Nutrition management  Role of diet in the treatment of diabetes and the relationship between the three main macronutrients and blood glucose level;Meal options for control of blood glucose level and chronic complications.    Acute complications Taught treatment of hypoglycemia - the 15 rule.    Chronic complications Relationship between chronic complications and blood glucose control;Reviewed with patient heart disease, higher risk of, and prevention;Identified and discussed with patient  current chronic complications;Assessed and discussed foot care and prevention of foot problems;Lipid levels, blood glucose control and heart disease;Retinopathy and reason for yearly dilated eye exams    Psychosocial adjustment Worked with patient to identify barriers to care and solutions;Role of stress on diabetes;Identified and addressed patients feelings and concerns about diabetes    Personal strategies to promote health Review risk of smoking and offered smoking cessation      Individualized Goals (developed by patient)   Nutrition  General guidelines for healthy choices and portions discussed    Physical Activity Exercise 5-7 days per week;Exercise 3-5 times per week;15 minutes per day    Medications take my medication as prescribed    Monitoring  test my blood glucose as discussed;Other (comment)    Reducing Risk examine blood glucose patterns;do foot checks daily;stop smoking;increase portions of healthy fats    Health Coping discuss diabetes with (comment)      Post-Education Assessment   Patient understands the diabetes disease and treatment process. Demonstrates understanding / competency    Patient understands incorporating nutritional management into lifestyle. Needs Review    Patient undertands incorporating physical activity into lifestyle. Demonstrates understanding / competency    Patient understands using medications safely. Demonstrates understanding / competency    Patient understands monitoring blood glucose,  interpreting and using results Demonstrates understanding / competency    Patient understands prevention, detection, and treatment of acute complications. Demonstrates understanding / competency    Patient understands prevention, detection, and treatment of chronic complications. Demonstrates understanding / competency    Patient understands how to develop strategies to address psychosocial issues. Needs Review    Patient understands how to develop strategies to promote health/change behavior. Needs Review      Outcomes   Expected Outcomes Demonstrated interest in learning. Expect positive outcomes    Future DMSE PRN    Program Status Completed           Individualized Plan for Diabetes Self-Management Training:   Learning Objective:  Patient will have a greater understanding of diabetes self-management. Patient education plan is to attend individual and/or group sessions per assessed needs and concerns.   Plan:   Patient Instructions  Consider getting your vitamin B-12 checked Consider  quitting smoking. Stay as active as you are able. Limit snacking at night.  Choose 1 carb and 1 protein.  Choose baked meat rather than fried. Choose whole wheat bread or English Muffins rather than biscuits  Take your medicine consistently as prescribed.    Expected Outcomes:  Demonstrated interest in learning. Expect positive outcomes  Education material provided: ADA - How to Thrive: A Guide for Your Journey with Diabetes, Meal plan card and Snack sheet  If problems or questions, patient to contact team via:  Phone  Future DSME appointment: PRN

## 2019-10-17 ENCOUNTER — Other Ambulatory Visit: Payer: Self-pay | Admitting: Family Medicine

## 2019-10-29 ENCOUNTER — Other Ambulatory Visit: Payer: Self-pay | Admitting: Cardiology

## 2019-10-30 ENCOUNTER — Encounter: Payer: Self-pay | Admitting: Cardiology

## 2019-10-30 ENCOUNTER — Telehealth (INDEPENDENT_AMBULATORY_CARE_PROVIDER_SITE_OTHER): Payer: Medicare Other | Admitting: Cardiology

## 2019-10-30 VITALS — Ht 63.0 in | Wt 143.8 lb

## 2019-10-30 DIAGNOSIS — Z95820 Peripheral vascular angioplasty status with implants and grafts: Secondary | ICD-10-CM

## 2019-10-30 DIAGNOSIS — E782 Mixed hyperlipidemia: Secondary | ICD-10-CM

## 2019-10-30 DIAGNOSIS — I25118 Atherosclerotic heart disease of native coronary artery with other forms of angina pectoris: Secondary | ICD-10-CM | POA: Diagnosis not present

## 2019-10-30 DIAGNOSIS — I10 Essential (primary) hypertension: Secondary | ICD-10-CM

## 2019-10-30 DIAGNOSIS — I2510 Atherosclerotic heart disease of native coronary artery without angina pectoris: Secondary | ICD-10-CM

## 2019-10-30 DIAGNOSIS — I779 Disorder of arteries and arterioles, unspecified: Secondary | ICD-10-CM

## 2019-10-30 DIAGNOSIS — E119 Type 2 diabetes mellitus without complications: Secondary | ICD-10-CM

## 2019-10-30 DIAGNOSIS — Z720 Tobacco use: Secondary | ICD-10-CM | POA: Diagnosis not present

## 2019-10-30 NOTE — Progress Notes (Signed)
Virtual Visit via Telephone Note   This visit type was conducted due to national recommendations for restrictions regarding the COVID-19 Pandemic (e.g. social distancing) in an effort to limit this patient's exposure and mitigate transmission in our community.  Due to his co-morbid illnesses, this patient is at least at moderate risk for complications without adequate follow up.  This format is felt to be most appropriate for this patient at this time.  The patient did not have access to video technology/had technical difficulties with video requiring transitioning to audio format only (telephone).  All issues noted in this document were discussed and addressed.  No physical exam could be performed with this format.  Please refer to the patient's chart for his  consent to telehealth for [facility].   The patient was identified using 2 identifiers.  Patient Location: Home Provider Location: Home Office   Date:  10/30/19   ID:  Dean Singh, Dean Singh 1960/07/01, MRN [id number]  PCP:  Dean Lukes, MD      Cardiologist:  Dean Dresser, MD PhD  Referring MD: Dean Lukes, MD   CC: follow up  History of Present Illness:    Dean Singh is a 59 y.o. male with a hx of CAD s/p multiple stents, CVA, tobacco abuse, hyperlipidemia, uncontrolled type II diabetes. Pertinent non-CV medical history includes bladder cancer, severe peripheral neuropathy, DDD, GERD. My initial visit with him was on 08/23/18.  Cath 09/02/18 showed: 1.  Left dominant coronary system with continued patency of the stented segment in the proximal/mid LAD and severe de novo stenosis in the mid and distal circumflex 2.  Successful PCI of the mid and distal circumflex using a 2.5 x 30 mm resolute Onyx DES in the distal vessel and a 3.0 x 26 mm resolute Onyx DES in the mid vessel 3.  Normal LV systolic function with normal LVEDP.  Plan: same day PCI, Pt loaded with clopidogrel on the table. Should remain  on DAPT with ASA and clopidogrel at least 6 months (stable CAD recommendation).  Today: Stopped taking coreg as he feels it elevates his sugars. 180-210 am fasting blood sugars, better than before but not ideal. Seeing an endocrinologist, insulin is helping, but feels frustrated that he was told that he would have to just deal with his neuropathy.   Only checks blood pressures every once in a while. No numbers today, but hasn't seen any high numbers. Last in office visit when he saw endocrinology was 110/60.  Very rare chest pain, much improved. Breathing is good. No edema. Still smoking but trying hard to cut back.   He is asking about help for his neuropathy. His foot pain limits his activity level. He does not want narcotics given his prior history/struggles, but would like to try some type of nerve agent. Duloxetine 30 mg worked great for his brother's severe diabetic nerve pain in his legs. He feels that if he could get his pain improved he would be able to be more active and hopefully improve his sugars even more.  Denies shortness of breath at rest or with normal exertion. No PND, orthopnea, LE edema or unexpected weight gain. No syncope or palpitations.  Past Medical History:  Diagnosis Date  . Arthritis    DDD  . Bladder cancer ([place])   . Bladder cancer ([place]) 08/17/2018  . CAD in native artery 08/17/2018  . Current nicotine use 08/17/2018  . Diabetes mellitus without complication ([place])   . GERD (gastroesophageal  reflux disease)   . H/O: stroke   . Heart attack ([place])    2 stents  . Hx of hemorrhoids 08/17/2018  . Hyperlipidemia   . Hypertension   . Peripheral neuropathy 08/17/2018  . Stroke ([place]) 57  . Visual changes 08/17/2018    Past Surgical History:  Procedure Laterality Date  . BLADDER TUMOR EXCISION     cancer  . CAROTID ENDARTERECTOMY Left   . CORONARY ANGIOPLASTY WITH STENT PLACEMENT     2 stent  . CORONARY STENT INTERVENTION N/A 09/02/2018   Procedure: CORONARY  STENT INTERVENTION;  Surgeon: Sherren Mocha, MD;  Location: [place] [facility];  Service: Cardiovascular;  Laterality: N/A;  . HERNIA REPAIR     right inguinal 2017  . LEFT HEART CATH AND CORONARY ANGIOGRAPHY N/A 09/02/2018   Procedure: LEFT HEART CATH AND CORONARY ANGIOGRAPHY;  Surgeon: Sherren Mocha, MD;  Location: [place] [facility];  Service: Cardiovascular;  Laterality: N/A;    Current Medications: Current Outpatient Medications on File Prior to Visit  Medication Sig  . aspirin EC 81 MG tablet Take 1 tablet (81 mg total) by mouth daily.  . Cinnamon 500 MG TABS Take by mouth.  . clopidogrel (PLAVIX) 75 MG tablet TAKE 1 TABLET BY MOUTH EVERY DAY  . Continuous Blood Gluc Receiver (FREESTYLE LIBRE 14 DAY READER) DEVI Use Freestyle Libre meter to monitor blood sugars. DX:E11.9 (Patient not taking: Reported on 10/02/2019)  . Continuous Blood Gluc Sensor (FREESTYLE LIBRE 14 DAY SENSOR) MISC Apply one sensor once every 14 days to monitor blood sugars. DX:E11.9 (Patient not taking: Reported on 10/02/2019)  . ezetimibe (ZETIA) 10 MG tablet Take 1 tablet (10 mg total) by mouth daily.  [name] glucose blood (ONETOUCH ULTRA) test strip USE TWICE DAILY AS DIRECTED ( E11.65)  . Insulin Glargine (BASAGLAR KWIKPEN) 100 UNIT/ML Inject 0.12 mLs (12 Units total) into the skin daily.  . Insulin Pen Needle 32G X 4 MM MISC 1 Device by Does not apply route daily.  . metFORMIN (GLUCOPHAGE) 1000 MG tablet TAKE 1 TABLET BY MOUTH TWICE A DAY  . nitroGLYCERIN (NITROSTAT) 0.4 MG SL tablet Place 0.4 mg under the tongue every 5 (five) minutes as needed for chest pain.  . pantoprazole (PROTONIX) 40 MG tablet Take 1 tablet (40 mg total) by mouth daily.  . rosuvastatin (CRESTOR) 5 MG tablet Take 1 tablet (5 mg total) by mouth daily.  . Saw Palmetto, Serenoa repens, (SAW PALMETTO PO) Take 1 capsule by mouth daily as needed (When having prostate problems).   . sitaGLIPtin (JANUVIA) 100 MG tablet Take 1 tablet (100 mg total) by  mouth daily.   No current facility-administered medications on file prior to visit.     Allergies:   Statins and Morphine   Social History   Tobacco Use  . Smoking status: Current Every Day Smoker  . Smokeless tobacco: Current User  Vaping Use  . Vaping Use: Never used  Substance Use Topics  . Alcohol use: Not Currently  . Drug use: Not Currently    Family History: The patient's family history includes Addison's disease in his sister; Arthritis in his father and mother; COPD in his mother; Dementia in his maternal grandfather, maternal grandmother, and mother; Diabetes in his brother, father, maternal grandfather, maternal grandmother, mother, and sister; Heart disease in his father, maternal grandfather, maternal grandmother, and mother; Hyperlipidemia in his brother, father, maternal grandfather, maternal grandmother, and sister; Hypertension in his brother and sister; Obesity in his brother, brother, and  sister; Osteoporosis in his father and mother; Other in his paternal grandmother; Parkinson's disease in his father; Prostate cancer in his paternal grandfather; Rheumatic fever in his son; Stickler syndrome in his son.  ROS:   Please see the history of present illness.  Additional pertinent ROS otherwise unremarkable.  EKGs/Labs/Other Studies Reviewed:    The following studies were reviewed today: Cath as above  EKG:  EKG is personally reviewed.  The ekg ordered 09/13/2018 demonstrates NSR  Recent Labs: 08/11/2019: ALT 12; BUN 17; Creatinine, Ser 0.87; Hemoglobin 15.8; Magnesium 1.9; Platelets 234.0; Potassium 4.7; Sodium 132  Recent Lipid Panel    Component Value Date/Time   CHOL 160 08/11/2019 1018   CHOL 177 05/01/2019 1108   TRIG 202.0 (H) 08/11/2019 1018   HDL 39.10 08/11/2019 1018   HDL 34 (L) 05/01/2019 1108   CHOLHDL 4 08/11/2019 1018   VLDL 40.4 (H) 08/11/2019 1018   LDLCALC 93 05/01/2019 1108   LDLDIRECT 94.0 08/11/2019 1018    Physical Exam:    VS:  Ht 5'  3" (1.6 m)   Wt 143 lb 12.8 oz (65.2 kg)   BMI 25.47 kg/m     Wt Readings from Last 3 Encounters:  10/30/19 143 lb 12.8 oz (65.2 kg)  10/02/19 145 lb (65.8 kg)  08/30/19 146 lb 9.6 oz (66.5 kg)    Speaking comfortably on the phone, no audible wheezing In no acute distress Alert and oriented Normal affect Normal speech  ASSESSMENT:    No diagnosis found. PLAN:    Type II diabetes, without obesity: -working on improved control, saw Dr. Kelton Pillar in endocrinology. -his number one frustration is diabetic neuropathy. His feet and legs burn and limit his activity. He would like to try a nerve agent for this, and his brother has had great success with duloxetine. From a cardiovascular standpoint, I have no issues with this. Clinical trials have suggested there is no significant increase in CV risk in CAD. He will call Dr. Frederik Pear office to set up an appointment to discuss options.  History of CAD, s/p 2 additional stents 08/2018:  -rare, stable angina -continue aspirin 81 mg -continue clopidogrel 12 months post PCI. With issues with diabetes control and continued smoking, will continue in the short term, anticipate discussing stopping at follow up -stopped carvedilol as he felt this was driving up his sugars. We discussed this today. Monitor blood pressure, consider retrialing beta blocker once sugars are stable -counseled on red flag warning signs that need immediate medical attention -has PRN SL NG  Hypercholesterolemiaplus possible hypertriglyceridemia: LDL goal <70.  -intolerant of atorvastatin, had whole body hives(though this last >1 mos after stopping statin)and joint pain -tolerating daily rosuvastatin. Last LDL 94. Now on ezetimibe as well and tolerating  Hypertension:goal <130/80 -he stopped carvedilol as above -last BP in endocrinology was excellent -discussed home monitoring again -instructed on when to call.  Carotid stenosis s/p R CEA: -lipid control, tobacco  cessation, aspirin as above for ASCVD  Continues tobacco abuse: not interested in quitting at this time, discussed. Is working to cut back  CV risk and secondary prevention -recommend heart healthy/Mediterranean diet, with whole grains, fruits, vegetable, fish, lean meats, nuts, and olive oil. Limit salt. -recommend moderate walking, 3-5 times/week for 30-50 minutes each session. Aim for at least 150 minutes.week. Goal should be pace of 3 miles/hours, or walking 1.5 miles in 30 minutes -recommend avoidance of tobacco products. Avoid excess alcohol.  Plan for follow up: 3 mos  Today, I  have spent 13 minutes with the patient with telehealth technology discussing the above problems.  Additional time spent in chart review, documentation, and communication.  Medication Adjustments/Labs and Tests Ordered: Current medicines are reviewed at length with the patient today.  Concerns regarding medicines are outlined above.  No orders of the defined types were placed in this encounter.  No orders of the defined types were placed in this encounter.   Patient Instructions  Medication Instructions:  Your Physician recommend you continue on your current medication as directed.    *If you need a refill on your cardiac medications before your next appointment, please call your pharmacy*   Lab Work: None ordered at this time   Testing/Procedures: None ordered at this time  Follow-Up: At [facility], you and your health needs are our priority.  As part of our continuing mission to provide you with exceptional heart care, we have created designated Provider Care Teams.  These Care Teams include your primary Cardiologist (physician) and Advanced Practice Providers (APPs -  Physician Assistants and Nurse Practitioners) who all work together to provide you with the care you need, when you need it.  We recommend signing up for the patient portal called "MyChart".  Sign up information is provided on  this After Visit Summary.  MyChart is used to connect with patients for Virtual Visits (Telemedicine).  Patients are able to view lab/test results, encounter notes, upcoming appointments, etc.  Non-urgent messages can be sent to your provider as well.   To learn more about what you can do with MyChart, go to [email or web address].    Your next appointment:   3 months  The format for your next appointment:   In Person  Provider:   Dr. Harrell Gave        Signed, Dean Dresser, MD PhD 10/30/2019   [place]

## 2019-10-30 NOTE — Patient Instructions (Signed)
Medication Instructions:  Your Physician recommend you continue on your current medication as directed.    *If you need a refill on your cardiac medications before your next appointment, please call your pharmacy*   Lab Work: None ordered at this time   Testing/Procedures: None ordered at this time  Follow-Up: At [facility], you and your health needs are our priority.  As part of our continuing mission to provide you with exceptional heart care, we have created designated Provider Care Teams.  These Care Teams include your primary Cardiologist (physician) and Advanced Practice Providers (APPs -  Physician Assistants and Nurse Practitioners) who all work together to provide you with the care you need, when you need it.  We recommend signing up for the patient portal called "MyChart".  Sign up information is provided on this After Visit Summary.  MyChart is used to connect with patients for Virtual Visits (Telemedicine).  Patients are able to view lab/test results, encounter notes, upcoming appointments, etc.  Non-urgent messages can be sent to your provider as well.   To learn more about what you can do with MyChart, go to [email or web address].    Your next appointment:   3 months  The format for your next appointment:   In Person  Provider:   Dr. Harrell Gave

## 2019-12-01 ENCOUNTER — Ambulatory Visit: Payer: Medicare Other | Admitting: Internal Medicine

## 2019-12-04 ENCOUNTER — Other Ambulatory Visit: Payer: Self-pay

## 2019-12-04 ENCOUNTER — Encounter: Payer: Self-pay | Admitting: Internal Medicine

## 2019-12-04 ENCOUNTER — Ambulatory Visit (INDEPENDENT_AMBULATORY_CARE_PROVIDER_SITE_OTHER): Payer: Medicare Other | Admitting: Internal Medicine

## 2019-12-04 VITALS — BP 116/70 | HR 84 | Ht 63.0 in | Wt 147.2 lb

## 2019-12-04 DIAGNOSIS — G63 Polyneuropathy in diseases classified elsewhere: Secondary | ICD-10-CM | POA: Diagnosis not present

## 2019-12-04 DIAGNOSIS — I739 Peripheral vascular disease, unspecified: Secondary | ICD-10-CM

## 2019-12-04 DIAGNOSIS — E119 Type 2 diabetes mellitus without complications: Secondary | ICD-10-CM | POA: Diagnosis not present

## 2019-12-04 LAB — POCT GLYCOSYLATED HEMOGLOBIN (HGB A1C): Hemoglobin A1C: 11 % — AB (ref 4.0–5.6)

## 2019-12-04 MED ORDER — DAPAGLIFLOZIN PROPANEDIOL 5 MG PO TABS: 5.0000 mg | ORAL_TABLET | Freq: Every day | ORAL | 6 refills | Status: DC

## 2019-12-04 MED ORDER — BASAGLAR KWIKPEN 100 UNIT/ML ~~LOC~~ SOPN
16.0000 [IU] | PEN_INJECTOR | Freq: Every day | SUBCUTANEOUS | 4 refills | Status: DC
Start: 2019-12-04 — End: 2020-04-03

## 2019-12-04 MED ORDER — METFORMIN HCL 1000 MG PO TABS: 1000.0000 mg | ORAL_TABLET | Freq: Every day | ORAL | 3 refills | Status: DC

## 2019-12-04 MED ORDER — PREGABALIN 300 MG PO CAPS: 300.0000 mg | ORAL_CAPSULE | Freq: Every day | ORAL | 6 refills | Status: DC

## 2019-12-04 NOTE — Patient Instructions (Addendum)
°-   Increased  Basaglar 16 units daily  - Decrease Metformin 1000 mg , 1 tablet daily  - Continue Januvia 100 mg daily  - Start Farxiga 5 mg , 1 tablet   - STOP Gabapentin  - Start Lyrica 300 mg at bedtime       HOW TO TREAT LOW BLOOD SUGARS (Blood sugar LESS THAN 70 MG/DL)  Please follow the RULE OF 15 for the treatment of hypoglycemia treatment (when your (blood sugars are less than 70 mg/dL)    STEP 1: Take 15 grams of carbohydrates when your blood sugar is low, which includes:   3-4 GLUCOSE TABS  OR  3-4 OZ OF JUICE OR REGULAR SODA OR  ONE TUBE OF GLUCOSE GEL     STEP 2: RECHECK blood sugar in 15 MINUTES STEP 3: If your blood sugar is still low at the 15 minute recheck --> then, go back to STEP 1 and treat AGAIN with another 15 grams of carbohydrates.

## 2019-12-04 NOTE — Progress Notes (Signed)
Name: Dean Singh. Dean Singh Sex: 59 y.o., male   MRN/ DOB: [id number], Dec 12, 1960     PCP: Mosie Lukes, MD   Reason for Endocrinology Evaluation: Type 2 Diabetes Mellitus  Initial Endocrine Consultative Visit: 08/30/2019    PATIENT IDENTIFIER: Dean Singh. Dean Singh is a 59 y.o. male with a past medical history of HTN, T2DM and CAD. The patient has followed with Endocrinology clinic since 08/30/2019 for consultative assistance with management of his diabetes.  DIABETIC HISTORY:  Dean Singh was diagnosed with DM in 2018. He has been on oral glycemic agents for a while, Januvia started in 2021. His hemoglobin A1c has ranged from 9.3% in 2020, peaking at 14.1% in 2021  On his initial visit to our clinic he had an A1c of 14.1 % . He was on Metformin , glipizide, and Januvia . We stopped Glipizide, started basal insulin and continued Metformin and Januvia   SUBJECTIVE:   During the last visit (08/30/2019) : A1c 14.1% . Stopped Glipizide, started basal insulin and continued Metformin and Januvia   Today (12/04/2019): Dean Singh is here for a follow up on diabetes management.  He checks his blood sugars 1 times daily, preprandial to breakfast. The patient has not had hypoglycemic episodes since the last clinic visit   He has noted loose stools that he attributes to metformin up to 3-4x a day    He continues with c/o tingling, numbness and pain of the feet , currently on gabapentin 4 tabs at bedtime, 1 in the morning and 1 early morning  He also describes cramps and calf pain when he walks , this resolves with rest      HOME DIABETES REGIMEN:  Basaglar 12 units daily  Metformin 1000 mg , twice daily  Januvia 100 mg daily      Statin: yes ACE-I/ARB: no   METER DOWNLOAD SUMMARY: Date range evaluated: 11/16-11/29/2021 Average Number Tests/Day = 0.4 Overall Mean FS Glucose = 271 Standard Deviation = 49  BG Ranges: Low = 218 High = 360   Hypoglycemic Events/30 Days: BG < 50 =  0 Episodes of symptomatic severe hypoglycemia = 0    DIABETIC COMPLICATIONS: Microvascular complications:   Neuropathy  Denies: CKD  Last Eye Exam: Completed yrs ago  Macrovascular complications:     CVA, CAD ( S/P stent replacement )  Denies: CAD, CVA, PVD   HISTORY:  Past Medical History:  Past Medical History:  Diagnosis Date  . Arthritis    DDD  . Bladder cancer ([place])   . Bladder cancer ([place]) 08/17/2018  . CAD in native artery 08/17/2018  . Current nicotine use 08/17/2018  . Diabetes mellitus without complication ([employer])   . GERD (gastroesophageal reflux disease)   . H/O: stroke   . Heart attack ([place])    2 stents  . Hx of hemorrhoids 08/17/2018  . Hyperlipidemia   . Hypertension   . Peripheral neuropathy 08/17/2018  . Stroke ([place]) 57  . Visual changes 08/17/2018   Past Surgical History:  Past Surgical History:  Procedure Laterality Date  . BLADDER TUMOR EXCISION     cancer  . CAROTID ENDARTERECTOMY Left   . CORONARY ANGIOPLASTY WITH STENT PLACEMENT     2 stent  . CORONARY STENT INTERVENTION N/A 09/02/2018   Procedure: CORONARY STENT INTERVENTION;  Surgeon: Sherren Mocha, MD;  Location: [facility];  Service: Cardiovascular;  Laterality: N/A;  . HERNIA REPAIR     right inguinal 2017  . LEFT HEART  CATH AND CORONARY ANGIOGRAPHY N/A 09/02/2018   Procedure: LEFT HEART CATH AND CORONARY ANGIOGRAPHY;  Surgeon: Sherren Mocha, MD;  Location: [facility];  Service: Cardiovascular;  Laterality: N/A;    Social History:  reports that he has been smoking. He uses smokeless tobacco. He reports previous alcohol use. He reports previous drug use. Family History:  Family History  Problem Relation Age of Onset  . COPD Mother   . Diabetes Mother   . Dementia Mother   . Heart disease Mother        chf  . Arthritis Mother   . Osteoporosis Mother        h/o cigarette smoking  . Heart disease Father        stents 3 x 2, pacer, chf  . Parkinson's disease  Father   . Hyperlipidemia Father   . Diabetes Father   . Arthritis Father        DDD  . Osteoporosis Father        h/o cigarette use  . Addison's disease Sister   . Diabetes Sister   . Hyperlipidemia Sister   . Hypertension Sister   . Obesity Sister   . Hyperlipidemia Brother   . Hypertension Brother   . Diabetes Brother   . Obesity Brother   . Rheumatic fever Son   . Stickler syndrome Son   . Diabetes Maternal Grandmother   . Dementia Maternal Grandmother   . Heart disease Maternal Grandmother        chf  . Hyperlipidemia Maternal Grandmother   . Diabetes Maternal Grandfather   . Dementia Maternal Grandfather   . Heart disease Maternal Grandfather        cad  . Hyperlipidemia Maternal Grandfather   . Other Paternal Grandmother        scarlet fever  . Prostate cancer Paternal Grandfather   . Obesity Brother      HOME MEDICATIONS: Allergies as of 12/04/2019      Reactions   Statins Itching   Atorvastatin cause "my skin to feel horrible and unbearable".   Morphine Nausea And Vomiting   "felt like chest was exploding" pt reports      Medication List       Accurate as of December 04, 2019  3:33 PM. If you have any questions, ask your nurse or doctor.        aspirin EC 81 MG tablet Take 1 tablet (81 mg total) by mouth daily.   Basaglar KwikPen 100 UNIT/ML Inject 0.12 mLs (12 Units total) into the skin daily.   Cinnamon 500 MG Tabs Take by mouth.   clopidogrel 75 MG tablet Commonly known as: PLAVIX TAKE 1 TABLET BY MOUTH EVERY DAY   ezetimibe 10 MG tablet Commonly known as: ZETIA TAKE 1 TABLET BY MOUTH EVERY DAY   FreeStyle Libre 14 Day Reader [employer] meter to monitor blood sugars. DX:E11.9   FreeStyle Libre 14 Day Sensor Misc Apply one sensor once every 14 days to monitor blood sugars. DX:E11.9   gabapentin 100 MG capsule Commonly known as: NEURONTIN Take 100 mg by mouth 2 (two) times daily. 100 mg (1 tablet) every morning and  400 mg (4 tablet) at night   Insulin Pen Needle 32G X 4 MM Misc 1 Device by Does not apply route daily.   metFORMIN 1000 MG tablet Commonly known as: GLUCOPHAGE TAKE 1 TABLET BY MOUTH TWICE A DAY   nitroGLYCERIN 0.4 MG SL tablet Commonly known as: NITROSTAT  Place 0.4 mg under the tongue every 5 (five) minutes as needed for chest pain.   OneTouch Ultra test strip Generic drug: glucose blood USE TWICE DAILY AS DIRECTED ( E11.65)   pantoprazole 40 MG tablet Commonly known as: PROTONIX Take 1 tablet (40 mg total) by mouth daily.   rosuvastatin 5 MG tablet Commonly known as: CRESTOR Take 1 tablet (5 mg total) by mouth daily.   SAW PALMETTO PO Take 1 capsule by mouth daily as needed (When having prostate problems).   sitaGLIPtin 100 MG tablet Commonly known as: Januvia Take 1 tablet (100 mg total) by mouth daily.        OBJECTIVE:   Vital Signs: BP 116/70   Pulse 84   Ht 5\' 3"  (1.6 m)   Wt 147 lb 4 oz (66.8 kg)   SpO2 98%   BMI 26.08 kg/m   Wt Readings from Last 3 Encounters:  12/04/19 147 lb 4 oz (66.8 kg)  10/30/19 143 lb 12.8 oz (65.2 kg)  10/02/19 145 lb (65.8 kg)     Exam: General: Pt appears well and is in NAD  Lungs: Clear with good BS bilat with no rales, rhonchi, or wheezes  Heart: RRR with normal S1 and S2 and no gallops; no murmurs; no rub  Abdomen: Normoactive bowel sounds, soft, nontender, without masses or organomegaly palpable  Extremities: No pretibial edema.   Neuro: MS is good with appropriate affect, pt is alert and Ox3    DM foot exam:   12/04/2019  The skin of the feet is intact without sores or ulcerations. The pedal pulses are undetectable The sensation is decreased to a screening 5.07, 10 gram monofilament bilaterally        DATA REVIEWED:  Lab Results  Component Value Date   HGBA1C 11.0 (A) 12/04/2019   HGBA1C 14.1 (H) 08/11/2019   HGBA1C 12.8 (H) 05/01/2019   Lab Results  Component Value Date   MICROALBUR 2.1  08/11/2019   LDLCALC 93 05/01/2019   CREATININE 0.87 08/11/2019     Lab Results  Component Value Date   CHOL 160 08/11/2019   HDL 39.10 08/11/2019   LDLCALC 93 05/01/2019   LDLDIRECT 94.0 08/11/2019   TRIG 202.0 (H) 08/11/2019   CHOLHDL 4 08/11/2019         ASSESSMENT / PLAN / RECOMMENDATIONS:   1) Type 2 Diabetes Mellitus,Poorly controlled, With neuropathic and macrovascular  complications - Most recent A1c of 11.0 %. Goal A1c < 7.0 %.    - A1c down from 14.1% - I have praised the pt on improved glycemic control , I have again encouraged him to follow a low carb diet and avoid sugar sweetened beverages.  - We discussed adding SGLT-2 inhibitor, I have cautioned him against genital infections  - Will reduce Metformin dose by 50 % to see if this would resolve his diarrhea.     MEDICATIONS: - Increased  Basaglar 16 units daily  - Decrease Metformin 1000 mg , 1 tablet daily  - Continue Januvia 100 mg daily  - Start Farxiga 5 mg , 1 tablet    EDUCATION / INSTRUCTIONS:  BG monitoring instructions: Patient is instructed to check his blood sugars 1 times a day, fasting .  Call [facility] if: BG persistently < 70  . I reviewed the Rule of 15 for the treatment of hypoglycemia in detail with the patient. Literature supplied.    2) Diabetic complications:   Eye: Does not have known diabetic retinopathy.  Neuro/ Feet: Does have known diabetic peripheral neuropathy .   Renal: Patient does not have known baseline CKD. He   is not on an ACEI/ARB at present.    3) Peripheral Neuropathy :  - He is currently on Gabapentin 6 tablets daily and his symptoms are not controlled. Will try switching Gabapentin to Lyrica   Medication  Stop Gabapentin  Start Lyrica 300 mg QHS    4) Intermittent Claudication:  - Pt on rosuvastatin  - Will proceed with ABI    F/U in 3 months    Signed electronically by: Mack Guise, MD  [facility] Endocrinology   [facility] [address], [place] [address] Phone: [phone] FAX: [phone]   CC: Mosie Lukes, [address] [place] [address] Phone: [phone]  Fax: [phone]  Return to Endocrinology clinic as below: Future Appointments  Date Time Provider [place]  02/08/2020  9:00 AM Buford Dresser, MD [facility]

## 2019-12-06 ENCOUNTER — Telehealth: Payer: Self-pay | Admitting: Internal Medicine

## 2019-12-06 ENCOUNTER — Other Ambulatory Visit: Payer: Self-pay | Admitting: Medical

## 2019-12-06 NOTE — Telephone Encounter (Signed)
Message left for Dean Singh to return my call.

## 2019-12-06 NOTE — Telephone Encounter (Signed)
That would be great , I didn't know this was an option    Thanks

## 2019-12-06 NOTE — Telephone Encounter (Signed)
Dean Singh called back and was notified to include exercise.

## 2019-12-06 NOTE — Telephone Encounter (Signed)
Please advise 

## 2019-12-06 NOTE — Telephone Encounter (Signed)
Dean Singh with [facility] requests to be called at ph# [phone] re: Dean Singh requests clarification  on whether or not Dr. Kelton Pillar wants the Order to include exercise

## 2019-12-13 ENCOUNTER — Other Ambulatory Visit: Payer: Self-pay | Admitting: Family Medicine

## 2019-12-18 ENCOUNTER — Ambulatory Visit
Admission: RE | Admit: 2019-12-18 | Discharge: 2019-12-18 | Disposition: A | Discharge status: Home / Self Care | Payer: Medicare Other | Source: Ambulatory Visit | Attending: Internal Medicine | Admitting: Internal Medicine

## 2019-12-18 ENCOUNTER — Telehealth: Payer: Self-pay | Admitting: Internal Medicine

## 2019-12-18 DIAGNOSIS — I739 Peripheral vascular disease, unspecified: Secondary | ICD-10-CM

## 2019-12-18 NOTE — Telephone Encounter (Signed)
Left a message for the pt to call back to discuss ABI results  On 12/18/2019 at [place], MD  [facility]  [facility] [address] [place], [place] [address] Phone: [phone] FAX: [phone]

## 2019-12-19 ENCOUNTER — Telehealth: Payer: Self-pay | Admitting: Internal Medicine

## 2019-12-19 DIAGNOSIS — I739 Peripheral vascular disease, unspecified: Secondary | ICD-10-CM

## 2019-12-19 NOTE — Telephone Encounter (Signed)
Patient returned Dr. Quin Hoop call. Patient requests to be called at ph# [phone].

## 2019-12-19 NOTE — Telephone Encounter (Signed)
Discussed abnormal ABI with the pt  On 12/19/2019 and the need to stent placement.    I have strongly encouraged to quit smoking  He is already on statins and Plavix    He is in agreement of seeing a vascular surgeon     A referral has been placed    Abby Nena Jordan, MD  [facility] Endocrinology  [facility] [address] [place], [place] [address] Phone: [phone] FAX: [phone]

## 2020-01-09 ENCOUNTER — Other Ambulatory Visit: Payer: Self-pay | Admitting: Family Medicine

## 2020-01-19 ENCOUNTER — Encounter: Payer: Medicare Other | Admitting: Vascular Surgery

## 2020-01-23 ENCOUNTER — Other Ambulatory Visit: Payer: Self-pay

## 2020-01-23 ENCOUNTER — Ambulatory Visit (INDEPENDENT_AMBULATORY_CARE_PROVIDER_SITE_OTHER): Payer: Medicare Other | Admitting: Vascular Surgery

## 2020-01-23 ENCOUNTER — Encounter: Payer: Self-pay | Admitting: Vascular Surgery

## 2020-01-23 VITALS — BP 111/67 | HR 73 | Temp 98.2°F | Resp 16 | Ht 63.0 in | Wt 146.0 lb

## 2020-01-23 DIAGNOSIS — I739 Peripheral vascular disease, unspecified: Secondary | ICD-10-CM

## 2020-01-23 NOTE — Progress Notes (Signed)
Patient name: Dean Singh. Gaunt MRN: [id number] DOB: Aug 27, 1960 Sex: male  REASON FOR CONSULT: Evaluate PAD and lower extremity claudication  HPI: Dean Singh is a 60 y.o. male, with history of hypertension, hyperlipidemia, coronary artery disease status post remote PCI, diabetes, tobacco abuse, bladder cancer that presents for evaluation of PAD and lower extremity claudication.  Patient states he has had pretty significant pain in both calves that has been ongoing for the last several months.  He states this always starts when he walks and he can only walk about 10 feet before he has to stop.  He finds this very disabling and he is really unable to work or do much of anything.  He was smoking 2 and half packs a day and has decreased this to 1 pack per day.  He denies any previous lower extremity interventions in the past.  He did have lower extremity arterial study on 12/18/2019 that showed bilateral SFA disease with an ABI of 0.78 on the right and 0.73 on the left.  Past Medical History:  Diagnosis Date  . Arthritis    DDD  . Bladder cancer ([place])   . Bladder cancer ([place]) 08/17/2018  . CAD in native artery 08/17/2018  . Current nicotine use 08/17/2018  . Diabetes mellitus without complication ([place])   . GERD (gastroesophageal reflux disease)   . H/O: stroke   . Heart attack ([place])    2 stents  . Hx of hemorrhoids 08/17/2018  . Hyperlipidemia   . Hypertension   . Peripheral neuropathy 08/17/2018  . Stroke ([place]) 57  . Visual changes 08/17/2018    Past Surgical History:  Procedure Laterality Date  . BLADDER TUMOR EXCISION     cancer  . CAROTID ENDARTERECTOMY Left   . CORONARY ANGIOPLASTY WITH STENT PLACEMENT     2 stent  . CORONARY STENT INTERVENTION N/A 09/02/2018   Procedure: CORONARY STENT INTERVENTION;  Surgeon: Sherren Mocha, MD;  Location: [facility];  Service: Cardiovascular;  Laterality: N/A;  . HERNIA REPAIR     right inguinal 2017  . LEFT HEART CATH AND CORONARY  ANGIOGRAPHY N/A 09/02/2018   Procedure: LEFT HEART CATH AND CORONARY ANGIOGRAPHY;  Surgeon: Sherren Mocha, MD;  Location: [facility];  Service: Cardiovascular;  Laterality: N/A;    Family History  Problem Relation Age of Onset  . COPD Mother   . Diabetes Mother   . Dementia Mother   . Heart disease Mother        chf  . Arthritis Mother   . Osteoporosis Mother        h/o cigarette smoking  . Heart disease Father        stents 3 x 2, pacer, chf  . Parkinson's disease Father   . Hyperlipidemia Father   . Diabetes Father   . Arthritis Father        DDD  . Osteoporosis Father        h/o cigarette use  . Addison's disease Sister   . Diabetes Sister   . Hyperlipidemia Sister   . Hypertension Sister   . Obesity Sister   . Hyperlipidemia Brother   . Hypertension Brother   . Diabetes Brother   . Obesity Brother   . Rheumatic fever Son   . Stickler syndrome Son   . Diabetes Maternal Grandmother   . Dementia Maternal Grandmother   . Heart disease Maternal Grandmother        chf  . Hyperlipidemia Maternal  Grandmother   . Diabetes Maternal Grandfather   . Dementia Maternal Grandfather   . Heart disease Maternal Grandfather        cad  . Hyperlipidemia Maternal Grandfather   . Other Paternal Grandmother        scarlet fever  . Prostate cancer Paternal Grandfather   . Obesity Brother     SOCIAL HISTORY: Social History   Socioeconomic History  . Marital status: Married    Spouse name: Not on file  . Number of children: Not on file  . Years of education: Not on file  . Highest education level: Not on file  Occupational History  . Not on file  Tobacco Use  . Smoking status: Current Every Day Smoker  . Smokeless tobacco: Current User  Vaping Use  . Vaping Use: Never used  Substance and Sexual Activity  . Alcohol use: Not Currently  . Drug use: Not Currently  . Sexual activity: Yes  Other Topics Concern  . 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 Statins Itching    Atorvastatin cause "my skin to feel horrible and unbearable".  . Morphine Nausea And Vomiting    "felt like chest was exploding" pt reports     Current Outpatient Medications  Medication Sig Dispense Refill  . aspirin EC 81 MG tablet Take 1 tablet (81 mg total) by mouth daily.    . Cinnamon 500 MG TABS Take by mouth.    . clopidogrel (PLAVIX) 75 MG tablet TAKE 1 TABLET BY MOUTH EVERY DAY 90 tablet 3  . dapagliflozin propanediol (FARXIGA) 5 MG TABS tablet Take 1 tablet (5 mg total) by mouth daily before breakfast. 30 tablet 6  . ezetimibe (ZETIA) 10 MG tablet TAKE 1 TABLET BY MOUTH EVERY DAY 90 tablet 3  . glucose blood (ONETOUCH ULTRA) test strip USE TWICE DAILY AS DIRECTED ( E11.65) 200 each 12  . Insulin Glargine (BASAGLAR KWIKPEN) 100 UNIT/ML Inject 16 Units into the skin daily. 30 mL 4  . Insulin Pen Needle 32G X 4 MM MISC 1 Device by Does not apply route daily. 50 each 6  . JANUVIA 100 MG tablet TAKE 1 TABLET BY MOUTH EVERY DAY 30 tablet 3  . metFORMIN (GLUCOPHAGE) 1000 MG tablet Take 1 tablet (1,000 mg total) by mouth daily with breakfast. 90 tablet 3  . pantoprazole (PROTONIX) 40 MG tablet Take 1 tablet (40 mg total) by mouth daily. 90 tablet 3  . pregabalin (LYRICA) 300 MG capsule Take 1 capsule (300 mg total) by mouth at bedtime. 30 capsule 6  . rosuvastatin (CRESTOR) 5 MG tablet Take 1 tablet (5 mg total) by mouth daily. 90 tablet 0  . Saw Palmetto, Serenoa repens, (SAW PALMETTO PO) Take 1 capsule by mouth daily as  needed (When having prostate problems).     . Continuous Blood Gluc Receiver (FREESTYLE LIBRE 14 DAY READER) DEVI Use Freestyle Libre meter to monitor blood sugars. DX:E11.9 (Patient not taking: No sig reported) 1 each 0  .  Continuous Blood Gluc Sensor (FREESTYLE LIBRE 14 DAY SENSOR) MISC Apply one sensor once every 14 days to monitor blood sugars. DX:E11.9 (Patient not taking: No sig reported) 2 each 2  . nitroGLYCERIN (NITROSTAT) 0.4 MG SL tablet Place 0.4 mg under the tongue every 5 (five) minutes as needed for chest pain. (Patient not taking: Reported on 01/23/2020)     No current facility-administered medications for this visit.    REVIEW OF SYSTEMS:  [X]  denotes positive finding, [ ]  denotes negative finding Cardiac  Comments:  Chest pain or chest pressure:    Shortness of breath upon exertion:    Short of breath when lying flat:    Irregular heart rhythm:        Vascular    Pain in calf, thigh, or hip brought on by ambulation: x   Pain in feet at night that wakes you up from your sleep:     Blood clot in your veins:    Leg swelling:         Pulmonary    Oxygen at home:    Productive cough:     Wheezing:         Neurologic    Sudden weakness in arms or legs:     Sudden numbness in arms or legs:     Sudden onset of difficulty speaking or slurred speech:    Temporary loss of vision in one eye:     Problems with dizziness:         Gastrointestinal    Blood in stool:     Vomited blood:         Genitourinary    Burning when urinating:     Blood in urine:        Psychiatric    Major depression:         Hematologic    Bleeding problems:    Problems with blood clotting too easily:        Skin    Rashes or ulcers:        Constitutional    Fever or chills:      PHYSICAL EXAM: Vitals:   01/23/20 1132  BP: 111/67  Pulse: 73  Resp: 16  Temp: 98.2 F (36.8 C)  TempSrc: Temporal  SpO2: 95%  Weight: 146 lb (66.2 kg)  Height: 5\' 3"  (1.6 m)    GENERAL: The  patient is a well-nourished male, in no acute distress. The vital signs are documented above. CARDIAC: There is a regular rate and rhythm.  VASCULAR:  Palpable femoral pulses both groins No palpable popliteal or pedal pulses No active tissue loss PULMONARY: No respiratory distress. ABDOMEN: Soft and non-tender. MUSCULOSKELETAL: There are no major deformities or cyanosis. NEUROLOGIC: No focal weakness or paresthesias are detected. SKIN: There are no ulcers or rashes noted. PSYCHIATRIC: The patient has a normal affect.  DATA:   ABIs 0.78 on the right 0.73 on the left  Assessment/Plan:  61 year old male presents with short distance lifestyle limiting claudication of both lower extremities.  I discussed with him in detail that generally we do not associate claudication with limb threatening ischemia.  Discussed that we typically try to manage this medically with walking therapies, smoking cessation and Pletal to improve his walking distances.  We had a long discussion about once we start intervention he may ultimately require additional interventions to maintain patency of stents etc.  Ultimately he feels that his symptoms are disabling enough that he really cannot do any kind of walking  therapies or other conservative management.  I discussed at the very least he needs to stop smoking.  As result we have discussed plan for aortogram, lower extremity arteriogram, with possible intervention.  I am going to delay this several weeks due to the [place] pandemic at the hospital and he is understanding.  We will do this as an outpatient at [facility].  Discussed he may ultimately require bypass surgery at a later date.   Marty Heck, MD [facility] Office: [phone]

## 2020-02-07 ENCOUNTER — Other Ambulatory Visit (HOSPITAL_COMMUNITY)
Admission: RE | Admit: 2020-02-07 | Discharge: 2020-02-07 | Disposition: A | Discharge status: Home / Self Care | Payer: Medicare Other | Source: Ambulatory Visit | Attending: Vascular Surgery | Admitting: Vascular Surgery

## 2020-02-07 DIAGNOSIS — Z20822 Contact with and (suspected) exposure to covid-19: Secondary | ICD-10-CM | POA: Insufficient documentation

## 2020-02-07 DIAGNOSIS — Z01812 Encounter for preprocedural laboratory examination: Secondary | ICD-10-CM | POA: Insufficient documentation

## 2020-02-07 LAB — SARS CORONAVIRUS 2 (TAT 6-24 HRS): SARS Coronavirus 2: NEGATIVE

## 2020-02-08 ENCOUNTER — Encounter (HOSPITAL_COMMUNITY)
Admission: RE | Disposition: A | Discharge status: Home / Self Care | Payer: Self-pay | Source: Home / Self Care | Attending: Vascular Surgery

## 2020-02-08 ENCOUNTER — Ambulatory Visit (HOSPITAL_COMMUNITY)
Admission: RE | Admit: 2020-02-08 | Discharge: 2020-02-08 | Disposition: A | Discharge status: Home / Self Care | Payer: Medicare Other | Attending: Vascular Surgery | Admitting: Vascular Surgery

## 2020-02-08 ENCOUNTER — Telehealth: Payer: Medicare Other | Admitting: Cardiology

## 2020-02-08 ENCOUNTER — Ambulatory Visit (HOSPITAL_BASED_OUTPATIENT_CLINIC_OR_DEPARTMENT_OTHER): Payer: Medicare Other

## 2020-02-08 ENCOUNTER — Other Ambulatory Visit: Payer: Self-pay

## 2020-02-08 ENCOUNTER — Encounter (HOSPITAL_COMMUNITY): Payer: Self-pay | Admitting: Vascular Surgery

## 2020-02-08 DIAGNOSIS — I70213 Atherosclerosis of native arteries of extremities with intermittent claudication, bilateral legs: Secondary | ICD-10-CM | POA: Insufficient documentation

## 2020-02-08 DIAGNOSIS — Z888 Allergy status to other drugs, medicaments and biological substances status: Secondary | ICD-10-CM | POA: Insufficient documentation

## 2020-02-08 DIAGNOSIS — Z794 Long term (current) use of insulin: Secondary | ICD-10-CM | POA: Diagnosis not present

## 2020-02-08 DIAGNOSIS — Z7982 Long term (current) use of aspirin: Secondary | ICD-10-CM | POA: Diagnosis not present

## 2020-02-08 DIAGNOSIS — I10 Essential (primary) hypertension: Secondary | ICD-10-CM | POA: Diagnosis not present

## 2020-02-08 DIAGNOSIS — F17210 Nicotine dependence, cigarettes, uncomplicated: Secondary | ICD-10-CM | POA: Diagnosis not present

## 2020-02-08 DIAGNOSIS — Z01810 Encounter for preprocedural cardiovascular examination: Secondary | ICD-10-CM

## 2020-02-08 DIAGNOSIS — Z885 Allergy status to narcotic agent status: Secondary | ICD-10-CM | POA: Insufficient documentation

## 2020-02-08 DIAGNOSIS — Z7902 Long term (current) use of antithrombotics/antiplatelets: Secondary | ICD-10-CM | POA: Insufficient documentation

## 2020-02-08 DIAGNOSIS — I2510 Atherosclerotic heart disease of native coronary artery without angina pectoris: Secondary | ICD-10-CM | POA: Insufficient documentation

## 2020-02-08 DIAGNOSIS — E785 Hyperlipidemia, unspecified: Secondary | ICD-10-CM | POA: Diagnosis not present

## 2020-02-08 DIAGNOSIS — Z79899 Other long term (current) drug therapy: Secondary | ICD-10-CM | POA: Diagnosis not present

## 2020-02-08 DIAGNOSIS — E1151 Type 2 diabetes mellitus with diabetic peripheral angiopathy without gangrene: Secondary | ICD-10-CM | POA: Insufficient documentation

## 2020-02-08 DIAGNOSIS — Z7984 Long term (current) use of oral hypoglycemic drugs: Secondary | ICD-10-CM | POA: Diagnosis not present

## 2020-02-08 HISTORY — PX: ABDOMINAL AORTOGRAM W/LOWER EXTREMITY: CATH118223

## 2020-02-08 LAB — POCT I-STAT, CHEM 8
BUN: 26 mg/dL — ABNORMAL HIGH (ref 6–20)
Calcium, Ion: 1.28 mmol/L (ref 1.15–1.40)
Chloride: 104 mmol/L (ref 98–111)
Creatinine, Ser: 0.7 mg/dL (ref 0.61–1.24)
Glucose, Bld: 267 mg/dL — ABNORMAL HIGH (ref 70–99)
HCT: 50 % (ref 39.0–52.0)
Hemoglobin: 17 g/dL (ref 13.0–17.0)
Potassium: 4.6 mmol/L (ref 3.5–5.1)
Sodium: 138 mmol/L (ref 135–145)
TCO2: 24 mmol/L (ref 22–32)

## 2020-02-08 LAB — GLUCOSE, CAPILLARY: Glucose-Capillary: 240 mg/dL — ABNORMAL HIGH (ref 70–99)

## 2020-02-08 SURGERY — ABDOMINAL AORTOGRAM W/LOWER EXTREMITY
Anesthesia: LOCAL | Laterality: Bilateral

## 2020-02-08 MED ORDER — ONDANSETRON HCL 4 MG/2ML IJ SOLN: 4.0000 mg | Freq: Four times a day (QID) | INTRAMUSCULAR | Status: DC | PRN

## 2020-02-08 MED ORDER — HYDRALAZINE HCL 20 MG/ML IJ SOLN: 5.0000 mg | INTRAMUSCULAR | Status: DC | PRN

## 2020-02-08 MED ORDER — MIDAZOLAM HCL 2 MG/2ML IJ SOLN
INTRAMUSCULAR | Status: AC
  Filled 2020-02-08: qty 2

## 2020-02-08 MED ORDER — FENTANYL CITRATE (PF) 100 MCG/2ML IJ SOLN
INTRAMUSCULAR | Status: AC
  Filled 2020-02-08: qty 2

## 2020-02-08 MED ORDER — LIDOCAINE HCL (PF) 1 % IJ SOLN
INTRAMUSCULAR | Status: DC | PRN
  Administered 2020-02-08: 15 mL

## 2020-02-08 MED ORDER — SODIUM CHLORIDE 0.9 % IV SOLN: 250.0000 mL | INTRAVENOUS | Status: DC | PRN

## 2020-02-08 MED ORDER — LABETALOL HCL 5 MG/ML IV SOLN: 10.0000 mg | INTRAVENOUS | Status: DC | PRN

## 2020-02-08 MED ORDER — LIDOCAINE HCL (PF) 1 % IJ SOLN
INTRAMUSCULAR | Status: AC
  Filled 2020-02-08: qty 30

## 2020-02-08 MED ORDER — CILOSTAZOL 100 MG PO TABS: 100.0000 mg | ORAL_TABLET | Freq: Two times a day (BID) | ORAL | 5 refills | Status: DC

## 2020-02-08 MED ORDER — SODIUM CHLORIDE 0.9 % IV SOLN: INTRAVENOUS | Status: DC

## 2020-02-08 MED ORDER — FENTANYL CITRATE (PF) 100 MCG/2ML IJ SOLN
INTRAMUSCULAR | Status: DC | PRN
  Administered 2020-02-08: 50 ug via INTRAVENOUS

## 2020-02-08 MED ORDER — SODIUM CHLORIDE 0.9% FLUSH: 3.0000 mL | INTRAVENOUS | Status: DC | PRN

## 2020-02-08 MED ORDER — ACETAMINOPHEN 325 MG PO TABS: 650.0000 mg | ORAL_TABLET | ORAL | Status: DC | PRN

## 2020-02-08 MED ORDER — IODIXANOL 320 MG/ML IV SOLN
INTRAVENOUS | Status: DC | PRN
  Administered 2020-02-08: 120 mL

## 2020-02-08 MED ORDER — MIDAZOLAM HCL 2 MG/2ML IJ SOLN
INTRAMUSCULAR | Status: DC | PRN
  Administered 2020-02-08: 1 mg via INTRAVENOUS

## 2020-02-08 MED ORDER — HEPARIN (PORCINE) IN NACL 1000-0.9 UT/500ML-% IV SOLN
INTRAVENOUS | Status: AC
  Filled 2020-02-08: qty 1000

## 2020-02-08 MED ORDER — SODIUM CHLORIDE 0.9 % WEIGHT BASED INFUSION: 1.0000 mL/kg/h | INTRAVENOUS | Status: DC

## 2020-02-08 MED ORDER — HEPARIN (PORCINE) IN NACL 1000-0.9 UT/500ML-% IV SOLN
INTRAVENOUS | Status: DC | PRN
  Administered 2020-02-08 (×2): 500 mL

## 2020-02-08 MED ORDER — SODIUM CHLORIDE 0.9% FLUSH: 3.0000 mL | Freq: Two times a day (BID) | INTRAVENOUS | Status: DC

## 2020-02-08 SURGICAL SUPPLY — 10 items
CATH OMNI FLUSH 5F 65CM (CATHETERS) ×2 IMPLANT
DEVICE CLOSURE MYNXGRIP 5F (Vascular Products) ×2 IMPLANT
KIT MICROPUNCTURE NIT STIFF (SHEATH) ×4 IMPLANT
KIT PV (KITS) ×2 IMPLANT
SHEATH PINNACLE 5F 10CM (SHEATH) ×2 IMPLANT
SHEATH PROBE COVER 6X72 (BAG) ×2 IMPLANT
SYR MEDRAD MARK V 150ML (SYRINGE) ×2 IMPLANT
TRANSDUCER W/STOPCOCK (MISCELLANEOUS) ×2 IMPLANT
TRAY PV CATH (CUSTOM PROCEDURE TRAY) ×2 IMPLANT
WIRE STARTER BENTSON 035X150 (WIRE) ×2 IMPLANT

## 2020-02-08 NOTE — Progress Notes (Addendum)
Ambulated in hallway and to the bathroom to void tol well. No bleeding noted before or after ambulation.  

## 2020-02-08 NOTE — H&P (Signed)
History and Physical Interval Note:  02/08/2020 8:49 AM  Dean Singh  has presented today for surgery, with the diagnosis of pvd.  The various methods of treatment have been discussed with the patient and family. After consideration of risks, benefits and other options for treatment, the patient has consented to  Procedure(s): ABDOMINAL AORTOGRAM W/LOWER EXTREMITY (N/A) as a surgical intervention.  The patient's history has been reviewed, patient examined, no change in status, stable for surgery.  I have reviewed the patient's chart and labs.  Questions were answered to the patient's satisfaction.    Aortogram, lower extremity arteriogram.  Marty Heck  Patient name: Dean Singh. Dinolfo            MRN: [id number]        DOB: [birth year]          Sex: male  REASON FOR CONSULT: Evaluate PAD and lower extremity claudication  HPI: Dean Singh is a 60 y.o. male, with history of hypertension, hyperlipidemia, coronary artery disease status post remote PCI, diabetes, tobacco abuse, bladder cancer that presents for evaluation of PAD and lower extremity claudication.  Patient states he has had pretty significant pain in both calves that has been ongoing for the last several months.  He states this always starts when he walks and he can only walk about 10 feet before he has to stop.  He finds this very disabling and he is really unable to work or do much of anything.  He was smoking 2 and half packs a day and has decreased this to 1 pack per day.  He denies any previous lower extremity interventions in the past.  He did have lower extremity arterial study on 12/18/2019 that showed bilateral SFA disease with an ABI of 0.78 on the right and 0.73 on the left.      Past Medical History:  Diagnosis Date  . Arthritis    DDD  . Bladder cancer ([place])   . Bladder cancer ([place]) 08/17/2018  . CAD in native artery 08/17/2018  . Current nicotine use 08/17/2018  . Diabetes mellitus without complication  ([place])   . GERD (gastroesophageal reflux disease)   . H/O: stroke   . Heart attack ([place])    2 stents  . Hx of hemorrhoids 08/17/2018  . Hyperlipidemia   . Hypertension   . Peripheral neuropathy 08/17/2018  . Stroke ([place]) 57  . Visual changes 08/17/2018         Past Surgical History:  Procedure Laterality Date  . BLADDER TUMOR EXCISION     cancer  . CAROTID ENDARTERECTOMY Left   . CORONARY ANGIOPLASTY WITH STENT PLACEMENT     2 stent  . CORONARY STENT INTERVENTION N/A 09/02/2018   Procedure: CORONARY STENT INTERVENTION;  Surgeon: Sherren Mocha, MD;  Location: [facility];  Service: Cardiovascular;  Laterality: N/A;  . HERNIA REPAIR     right inguinal 2017  . LEFT HEART CATH AND CORONARY ANGIOGRAPHY N/A 09/02/2018   Procedure: LEFT HEART CATH AND CORONARY ANGIOGRAPHY;  Surgeon: Sherren Mocha, MD;  Location: [facility];  Service: Cardiovascular;  Laterality: N/A;         Family History  Problem Relation Age of Onset  . COPD Mother   . Diabetes Mother   . Dementia Mother   . Heart disease Mother        chf  . Arthritis Mother   . Osteoporosis Mother        h/o cigarette smoking  .  Heart disease Father        stents 3 x 2, pacer, chf  . Parkinson's disease Father   . Hyperlipidemia Father   . Diabetes Father   . Arthritis Father        DDD  . Osteoporosis Father        h/o cigarette use  . Addison's disease Sister   . Diabetes Sister   . Hyperlipidemia Sister   . Hypertension Sister   . Obesity Sister   . Hyperlipidemia Brother   . Hypertension Brother   . Diabetes Brother   . Obesity Brother   . Rheumatic fever Son   . Stickler syndrome Son   . Diabetes Maternal Grandmother   . Dementia Maternal Grandmother   . Heart disease Maternal Grandmother        chf  . Hyperlipidemia Maternal Grandmother   . Diabetes Maternal Grandfather   . Dementia Maternal Grandfather   . Heart disease  Maternal Grandfather        cad  . Hyperlipidemia Maternal Grandfather   . Other Paternal Grandmother        scarlet fever  . Prostate cancer Paternal Grandfather   . Obesity Brother     SOCIAL HISTORY: Social History        Socioeconomic History  . Marital status: Married    Spouse name: Not on file  . Number of children: Not on file  . Years of education: Not on file  . Highest education level: Not on file  Occupational History  . Not on file  Tobacco Use  . Smoking status: Current Every Day Smoker  . Smokeless tobacco: Current User  Vaping Use  . Vaping Use: Never used  Substance and Sexual Activity  . Alcohol use: Not Currently  . Drug use: Not Currently  . Sexual activity: Yes  Other Topics Concern  . 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 Statins Itching    Atorvastatin cause "my skin to feel horrible and unbearable".  . Morphine Nausea And Vomiting    "felt like chest was exploding" pt reports           Current Outpatient Medications  Medication Sig Dispense Refill  . aspirin EC 81 MG tablet Take 1 tablet (81 mg total) by mouth daily.    . Cinnamon 500 MG TABS Take by mouth.    . clopidogrel (PLAVIX) 75 MG tablet TAKE 1 TABLET BY MOUTH EVERY DAY 90 tablet 3  . dapagliflozin propanediol (FARXIGA) 5 MG TABS tablet Take 1 tablet (5 mg total) by mouth daily before breakfast. 30 tablet 6  . ezetimibe (ZETIA) 10 MG tablet TAKE 1 TABLET BY MOUTH EVERY DAY 90 tablet 3  .  glucose blood (ONETOUCH ULTRA) test strip USE TWICE DAILY AS DIRECTED ( E11.65) 200 each 12  . Insulin Glargine (BASAGLAR KWIKPEN) 100 UNIT/ML Inject 16 Units into the skin daily. 30 mL 4  . Insulin Pen Needle 32G X 4 MM  MISC 1 Device by Does not apply route daily. 50 each 6  . JANUVIA 100 MG tablet TAKE 1 TABLET BY MOUTH EVERY DAY 30 tablet 3  . metFORMIN (GLUCOPHAGE) 1000 MG tablet Take 1 tablet (1,000 mg total) by mouth daily with breakfast. 90 tablet 3  . pantoprazole (PROTONIX) 40 MG tablet Take 1 tablet (40 mg total) by mouth daily. 90 tablet 3  . pregabalin (LYRICA) 300 MG capsule Take 1 capsule (300 mg total) by mouth at bedtime. 30 capsule 6  . rosuvastatin (CRESTOR) 5 MG tablet Take 1 tablet (5 mg total) by mouth daily. 90 tablet 0  . Saw Palmetto, Serenoa repens, (SAW PALMETTO PO) Take 1 capsule by mouth daily as needed (When having prostate problems).     . Continuous Blood Gluc Receiver (FREESTYLE LIBRE 14 DAY READER) DEVI Use Freestyle Libre meter to monitor blood sugars. DX:E11.9 (Patient not taking: No sig reported) 1 each 0  . Continuous Blood Gluc Sensor (FREESTYLE LIBRE 14 DAY SENSOR) MISC Apply one sensor once every 14 days to monitor blood sugars. DX:E11.9 (Patient not taking: No sig reported) 2 each 2  . nitroGLYCERIN (NITROSTAT) 0.4 MG SL tablet Place 0.4 mg under the tongue every 5 (five) minutes as needed for chest pain. (Patient not taking: Reported on 01/23/2020)     No current facility-administered medications for this visit.    REVIEW OF SYSTEMS:  [X]  denotes positive finding, [ ]  denotes negative finding Cardiac  Comments:  Chest pain or chest pressure:    Shortness of breath upon exertion:    Short of breath when lying flat:    Irregular heart rhythm:        Vascular    Pain in calf, thigh, or hip brought on by ambulation: x   Pain in feet at night that wakes you up from your sleep:     Blood clot in your veins:    Leg swelling:          Pulmonary    Oxygen at home:    Productive cough:     Wheezing:         Neurologic    Sudden weakness in arms or legs:     Sudden numbness in arms or legs:     Sudden onset of difficulty speaking or slurred speech:    Temporary loss of vision in one eye:     Problems with dizziness:         Gastrointestinal    Blood in stool:     Vomited blood:         Genitourinary    Burning when urinating:     Blood in urine:        Psychiatric    Major depression:         Hematologic    Bleeding problems:    Problems with blood clotting too easily:        Skin    Rashes or ulcers:        Constitutional    Fever or chills:      PHYSICAL EXAM:    Vitals:   01/23/20 1132  BP: 111/67  Pulse: 73  Resp: 16  Temp: 98.2 F (36.8 C)  TempSrc: Temporal  SpO2: 95%  Weight: 146 lb (66.2 kg)  Height: 5\' 3"  (1.6 m)    GENERAL: The patient is a well-nourished male, in no acute distress. The vital signs are documented above. CARDIAC: There is a regular rate and rhythm.  VASCULAR:  Palpable femoral pulses both groins No palpable popliteal or pedal pulses No active tissue loss PULMONARY: No respiratory distress. ABDOMEN: Soft and non-tender. MUSCULOSKELETAL: There are no major deformities or cyanosis. NEUROLOGIC: No focal weakness or paresthesias are detected. SKIN: There are no ulcers or rashes noted. PSYCHIATRIC: The patient has a normal affect.  DATA:   ABIs 0.78 on the right 0.73 on the left  Assessment/Plan:  60 year old male presents with short distance lifestyle limiting claudication of both lower extremities.  I discussed with him in detail that generally we do not associate claudication with limb threatening ischemia.  Discussed that we typically try to manage this medically with walking therapies, smoking cessation and Pletal to improve his walking distances.  We had a long  discussion about once we start intervention he may ultimately require additional interventions to maintain patency of stents etc.  Ultimately he feels that his symptoms are disabling enough that he really cannot do any kind of walking therapies or other conservative management.  I discussed at the very least he needs to stop smoking.  As result we have discussed plan for aortogram, lower extremity arteriogram, with possible intervention.  I am going to delay this several weeks due to the [place] pandemic at the hospital and he is understanding.  [facility].  Discussed he may ultimately require bypass surgery at a later date.   Marty Heck, MD [facility] Office: [phone]

## 2020-02-08 NOTE — Op Note (Signed)
    Patient name: Dean Singh MRN: [id number] DOB: 08-14-1960 Sex: male  02/08/2020 Pre-operative Diagnosis: Bilateral lower extremity lifestyle limiting claudication Post-operative diagnosis:  Same Surgeon:  Marty Heck, MD Procedure Performed: 1.  Ultrasound-guided access of right common femoral artery 2.  Aortogram including catheter selection of aorta 3.  Bilateral lower extremity arteriogram with runoff 4.  Mynx closure of the right common femoral artery 5.  23 minutes of monitored moderate conscious sedation time  Indications: 60 year old male with lifestyle limiting short distance claudication in bilateral lower extremities.  He presents today for aortogram, lower extremity arteriogram, and possible intervention after discussing multiple options with him in the office including recommendation for conservative medical management.  Findings:   Aortogram showed a patent infrarenal aorta with patent bilateral iliac arteries.  His aortoiliac segment vessels were fairly small but no flow-limiting stenosis evident.  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  Right lower extremity arteriogram shows a patent common femoral, profunda, diseased but patent SFA without overt flow limiting stenosis and patent popliteal and three-vessel runoff   Procedure:  The patient was identified in the holding area and taken to room 8.  The patient was then placed supine on the table and prepped and draped in the usual sterile fashion.  A time out was called.  Ultrasound was used to evaluate the right common femoral artery.  It was patent .  A digital ultrasound image was acquired.  A micropuncture needle was used to access the right common femoral artery under ultrasound guidance.  An 018 wire was  advanced without resistance and a micropuncture sheath was placed.  The 018 wire was removed and a benson wire was placed.  The micropuncture sheath was exchanged for a 5 french sheath.  An omniflush catheter was advanced over the wire to the level of L-1.  An abdominal angiogram was obtained.  Next the catheter was pulled down and bilateral lower extremity runoff was obtained.  Next, using the omniflush catheter and a benson wire, the aortic bifurcation was crossed and the catheter was placed into theleft external iliac artery and left runoff was obtained.  .  Plan: I again encouraged maximal medical management and tobacco cessation.  Given how small his SFA is proximally I think a femoral above knee popliteal bypass on the left would be his best option.  We will get vein mapping today.  He wants surgery in 4 weeks given his father is critically ill and discussed this is elective.   Marty Heck, MD [facility] Office: [phone]

## 2020-02-08 NOTE — Progress Notes (Signed)
Pt ambulated without difficulty or bleeding.   Discharged home with wife who will drive and stay with pt x 24 hrs 

## 2020-02-08 NOTE — Discharge Instructions (Signed)
Cilostazol tablets What is this medicine? CILOSTAZOL (sil [place] sta zol) is used to treat the symptoms of intermittent claudication. This condition causes pain in the legs during walking, and goes away with rest. By improving blood flow, this medicine helps people with this condition walk longer distances without pain. This medicine may be used for other purposes; ask your health care provider or pharmacist if you have questions. COMMON BRAND NAME(S): Pletal What should I tell my health care provider before I take this medicine? They need to know if you have any of the following conditions:  bleeding disorder or hemophilia  history of heart failure, heart attack, or other heart disease  an unusual or allergic reaction to cilostazol, other medicines, foods, dyes, or preservatives  pregnant or trying to get pregnant  breast-feeding How should I use this medicine? Take this medicine by mouth with a full glass of water. Follow the directions on the prescription label. Take this medicine on an empty stomach, at least 30 minutes before or 2 hours after food. Do not take with food. Take your doses at regular intervals. Do not take your medicine more often than directed. Talk to your pediatrician regarding the use of this medicine in children. Special care may be needed. Overdosage: If you think you have taken too much of this medicine contact a poison control center or emergency room at once. NOTE: This medicine is only for you. Do not share this medicine with others. What if I miss a dose? If you miss a dose, take it as soon as you can. If it is almost time for your next dose, take only that dose. Do not take double or extra doses. What may interact with this medicine? Do not take this medicine with any of the following medications:  grapefruit juice This medicine may also interact with the following medications:  agents that prevent or treat blood clots like enoxaparin or  warfarin  aspirin  diltiazem  erythromycin or clarithromycin  omeprazole  some medications for treating depression like fluoxetine, fluvoxamine, nefazodone  some medications for treating fungal infections like ketoconazole, fluconazole, itraconazole This list may not describe all possible interactions. Give your health care provider a list of all the medicines, herbs, non-prescription drugs, or dietary supplements you use. Also tell them if you smoke, drink alcohol, or use illegal drugs. Some items may interact with your medicine. What should I watch for while using this medicine? Visit your doctor or health care professional for regular checks on your progress. It may take 2 to 4 weeks for your condition to start to get better once you begin taking this medicine. In some people, it can take as long as 3 months for the condition to get better. You may get drowsy or dizzy. Do not drive, use machinery, or do anything that needs mental alertness until you know how this drug affects you. Do not stand or sit up quickly, especially if you are an older patient. This reduces the risk of dizzy or fainting spells. Alcohol can make you more drowsy and dizzy. Avoid alcoholic drinks. Smoking may have effects on the circulation that may limit the benefits you receive from this medicine. You may wish to discuss how to stop smoking with your doctor or health care professional. If you are going to have surgery, tell your doctor or health care professional that you are taking this medicine. What side effects may I notice from receiving this medicine? Side effects that you should report to your doctor  or health care professional as soon as possible:  allergic reactions like skin rash, itching or hives, swelling of the face, lips, or tongue  chest pain  fast, slow, or irregular heartbeat  signs and symptoms of bleeding such as bloody or black, tarry stools; red or dark-brown urine; spitting up blood or brown  material that looks like coffee grounds; red spots on the skin; unusual bruising or bleeding from the eye, gums, or nose  swelling in the legs or ankles Side effects that usually do not require medical attention (report to your doctor or health care professional if they continue or are bothersome):  diarrhea  headache  nausea, or upset stomach This list may not describe all possible side effects. Call your doctor for medical advice about side effects. You may report side effects to FDA at 1-800-FDA-1088. Where should I keep my medicine? Keep out of the reach of children. Store at room temperature between 15 and 30 degrees C (59 and 86 degrees F). Throw away any unused medicine after the expiration date. NOTE: This sheet is a summary. It may not cover all possible information. If you have questions about this medicine, talk to your doctor, pharmacist, or health care provider.  2021 Elsevier/Gold Standard (2012-04-14 15:00:52)    Femoral Site Care This sheet gives you information about how to care for yourself after your procedure. Your health care provider may also give you more specific instructions. If you have problems or questions, contact your health care provider. What can I expect after the procedure? After the procedure, it is common to have:  Bruising that usually fades within 1-2 weeks.  Tenderness at the site. Follow these instructions at home: Wound care 1. May remove bandage after 24 hours. 2. Do not take baths, swim, or use a hot tub for 5 days. 3. You may shower 24-48 hours after the procedure. ? Gently wash the site with plain soap and water. ? Pat the area dry with a clean towel. ? Do not rub the site. This may cause bleeding. 4. Do not apply powder or lotion to the site. Keep the site clean and dry. 5. Check your femoral site every day for signs of infection. Check for: ? Redness, swelling, or pain. ? Fluid or blood. ? Warmth. ? Pus or a bad  smell. Activity 1. For the first 2-3 days after your procedure, or as long as directed: ? Avoid climbing stairs as much as possible. ? Do not squat. 2. Do not lift, push or pull anything that is heavier than 10 lb for 5 days. 3. Rest as directed. ? Avoid sitting for a long time without moving. Get up to take short walks every 1-2 hours. 4. Do not drive for 24 hours. General instructions  Take over-the-counter and prescription medicines only as told by your health care provider.  Keep all follow-up visits as told by your health care provider. This is important.  DRINK PLENTY OF FLUIDS FOR THE NEXT 2-3 DAYS. Contact a health care provider if you have:  A fever or chills.  You have redness, swelling, or pain around your insertion site. Get help right away if:  The catheter insertion area swells very fast.  You pass out.  You suddenly start to sweat or your skin gets clammy.  The catheter insertion area is bleeding, and the bleeding does not stop when you hold steady pressure on the area.  The area near or just beyond the catheter insertion site becomes pale, cool, tingly,  or numb. These symptoms may represent a serious problem that is an emergency. Do not wait to see if the symptoms will go away. Get medical help right away. Call your local emergency services (911 in the [place] Do not drive yourself to the hospital. Summary  After the procedure, it is common to have bruising that usually fades within 1-2 weeks.  Check your femoral site every day for signs of infection.  Do not lift, push or pull anything that is heavier than 10 lb for 5 days.  This information is not intended to replace advice given to you by your health care provider. Make sure you discuss any questions you have with your health care provider. Document Revised: 01/04/2017 Document Reviewed: 01/04/2017 Elsevier Patient Education  2020 [employer].

## 2020-02-08 NOTE — Progress Notes (Signed)
Lower extremity vein mapping has been completed.   Preliminary results in [facility].   Abram Sander 02/08/2020 11:26 AM

## 2020-02-09 ENCOUNTER — Telehealth: Payer: Self-pay | Admitting: *Deleted

## 2020-02-09 ENCOUNTER — Encounter: Payer: Medicare Other | Admitting: Vascular Surgery

## 2020-02-09 NOTE — Telephone Encounter (Signed)
Dean Lukes, MD  Doylene Canning, CMA; Chism, Shaakira A, CMA I saw this guy once in 2020. He has slipped thru the cracks. He needs a f/u appt even it is just lab work and then a VV

## 2020-02-12 NOTE — Telephone Encounter (Signed)
Pt is scheduled for 04/01/20 at 3:20pm

## 2020-02-22 ENCOUNTER — Telehealth: Payer: Medicare Other | Admitting: Cardiology

## 2020-02-27 ENCOUNTER — Telehealth: Payer: Medicare Other | Admitting: Cardiology

## 2020-02-27 ENCOUNTER — Telehealth: Payer: Self-pay

## 2020-02-27 NOTE — Telephone Encounter (Signed)
Spoke with pt. Reports after starting cilostazol Dr. Carlis Abbott placed him, it has helped his symptoms and he doesn't feel he needs surgery at this time and would like to cancel the left fem-pop on 03/06/20. Pt will callback if anything changes.

## 2020-03-04 ENCOUNTER — Ambulatory Visit: Payer: Medicare Other | Admitting: Internal Medicine

## 2020-03-05 ENCOUNTER — Other Ambulatory Visit (HOSPITAL_COMMUNITY): Payer: Medicare Other

## 2020-03-06 ENCOUNTER — Inpatient Hospital Stay: Admit: 2020-03-06 | Payer: Medicare Other | Admitting: Vascular Surgery

## 2020-03-06 SURGERY — BYPASS GRAFT FEMORAL-POPLITEAL ARTERY
Anesthesia: General | Laterality: Left

## 2020-03-26 ENCOUNTER — Encounter: Payer: Self-pay | Admitting: Family Medicine

## 2020-03-28 ENCOUNTER — Other Ambulatory Visit: Payer: Self-pay | Admitting: Medical

## 2020-03-28 ENCOUNTER — Other Ambulatory Visit: Payer: Self-pay | Admitting: *Deleted

## 2020-03-28 MED ORDER — ROSUVASTATIN CALCIUM 5 MG PO TABS: 5.0000 mg | ORAL_TABLET | Freq: Every day | ORAL | 0 refills | Status: DC

## 2020-03-30 ENCOUNTER — Other Ambulatory Visit: Payer: Self-pay | Admitting: Family Medicine

## 2020-04-01 ENCOUNTER — Other Ambulatory Visit: Payer: Self-pay

## 2020-04-01 ENCOUNTER — Ambulatory Visit (INDEPENDENT_AMBULATORY_CARE_PROVIDER_SITE_OTHER): Payer: Medicare Other | Admitting: Family Medicine

## 2020-04-01 ENCOUNTER — Ambulatory Visit (HOSPITAL_BASED_OUTPATIENT_CLINIC_OR_DEPARTMENT_OTHER)
Admission: RE | Admit: 2020-04-01 | Discharge: 2020-04-01 | Disposition: A | Discharge status: Home / Self Care | Payer: Medicare Other | Source: Ambulatory Visit | Attending: Family Medicine | Admitting: Family Medicine

## 2020-04-01 ENCOUNTER — Encounter: Payer: Self-pay | Admitting: Family Medicine

## 2020-04-01 VITALS — BP 118/62 | HR 83 | Temp 98.1°F | Resp 16 | Wt 145.4 lb

## 2020-04-01 DIAGNOSIS — Z720 Tobacco use: Secondary | ICD-10-CM

## 2020-04-01 DIAGNOSIS — E782 Mixed hyperlipidemia: Secondary | ICD-10-CM

## 2020-04-01 DIAGNOSIS — E785 Hyperlipidemia, unspecified: Secondary | ICD-10-CM

## 2020-04-01 DIAGNOSIS — I10 Essential (primary) hypertension: Secondary | ICD-10-CM

## 2020-04-01 DIAGNOSIS — E1142 Type 2 diabetes mellitus with diabetic polyneuropathy: Secondary | ICD-10-CM

## 2020-04-01 DIAGNOSIS — R109 Unspecified abdominal pain: Secondary | ICD-10-CM

## 2020-04-01 DIAGNOSIS — E119 Type 2 diabetes mellitus without complications: Secondary | ICD-10-CM

## 2020-04-01 DIAGNOSIS — R1012 Left upper quadrant pain: Secondary | ICD-10-CM | POA: Insufficient documentation

## 2020-04-01 DIAGNOSIS — Z789 Other specified health status: Secondary | ICD-10-CM

## 2020-04-01 MED ORDER — FAMOTIDINE 40 MG PO TABS: 40.0000 mg | ORAL_TABLET | Freq: Every day | ORAL | 3 refills | Status: DC

## 2020-04-01 MED ORDER — SUCRALFATE 1 GM/10ML PO SUSP: 1.0000 g | Freq: Three times a day (TID) | ORAL | 1 refills | Status: DC

## 2020-04-01 NOTE — Progress Notes (Signed)
Subjective:    Patient ID: Dean Singh, male    DOB: May 03, 1960, 60 y.o.   MRN: [id number]  Chief Complaint  Patient presents with  . Follow-up    Pain in left side    HPI Patient is in today for follow up on chronic medical concerns. He is struggling with left sided flank pain for several weeks worse when he lies down. No change in bowel or bladder habits. No falls or trauma. Denies CP/palp/SOB/HA/congestion/fevers/GI or GU c/o. Taking meds as prescribed  Past Medical History:  Diagnosis Date  . Arthritis    DDD  . Bladder cancer ([place])   . Bladder cancer ([place]) 08/17/2018  . CAD in native artery 08/17/2018  . Current nicotine use 08/17/2018  . Diabetes mellitus without complication ([place])   . GERD (gastroesophageal reflux disease)   . H/O: stroke   . Heart attack ([place])    2 stents  . Hx of hemorrhoids 08/17/2018  . Hyperlipidemia   . Hypertension   . Peripheral neuropathy 08/17/2018  . Stroke ([place]) 57  . Visual changes 08/17/2018    Past Surgical History:  Procedure Laterality Date  . ABDOMINAL AORTOGRAM W/LOWER EXTREMITY Bilateral 02/08/2020   Procedure: ABDOMINAL AORTOGRAM W/LOWER EXTREMITY;  Surgeon: Marty Heck, MD;  Location: [facility];  Service: Cardiovascular;  Laterality: Bilateral;  . BLADDER TUMOR EXCISION     cancer  . CAROTID ENDARTERECTOMY Left   . CORONARY ANGIOPLASTY WITH STENT PLACEMENT     2 stent  . CORONARY STENT INTERVENTION N/A 09/02/2018   Procedure: CORONARY STENT INTERVENTION;  Surgeon: Sherren Mocha, MD;  Location: [facility];  Service: Cardiovascular;  Laterality: N/A;  . HERNIA REPAIR     right inguinal 2017  . LEFT HEART CATH AND CORONARY ANGIOGRAPHY N/A 09/02/2018   Procedure: LEFT HEART CATH AND CORONARY ANGIOGRAPHY;  Surgeon: Sherren Mocha, MD;  Location: [facility];  Service: Cardiovascular;  Laterality: N/A;    Family History  Problem Relation Age of Onset  . COPD Mother   . Diabetes Mother   .  Dementia Mother   . Heart disease Mother        chf  . Arthritis Mother   . Osteoporosis Mother        h/o cigarette smoking  . Heart disease Father        stents 3 x 2, pacer, chf  . Parkinson's disease Father   . Hyperlipidemia Father   . Diabetes Father   . Arthritis Father        DDD  . Osteoporosis Father        h/o cigarette use  . Addison's disease Sister   . Diabetes Sister   . Hyperlipidemia Sister   . Hypertension Sister   . Obesity Sister   . Hyperlipidemia Brother   . Hypertension Brother   . Diabetes Brother   . Obesity Brother   . Rheumatic fever Son   . Stickler syndrome Son   . Diabetes Maternal Grandmother   . Dementia Maternal Grandmother   . Heart disease Maternal Grandmother        chf  . Hyperlipidemia Maternal Grandmother   . Diabetes Maternal Grandfather   . Dementia Maternal Grandfather   . Heart disease Maternal Grandfather        cad  . Hyperlipidemia Maternal Grandfather   . Other Paternal Grandmother        scarlet fever  . Prostate cancer Paternal Grandfather   .  Obesity Brother     Social History   Socioeconomic History  . Marital status: Married    Spouse name: Not on file  . Number of children: Not on file  . Years of education: Not on file  . Highest education level: Not on file  Occupational History  . Not on file  Tobacco Use  . Smoking status: Current Every Day Smoker    Packs/day: 1.00    Types: Cigarettes  . Smokeless tobacco: Current User  Vaping Use  . Vaping Use: Never used  Substance and Sexual Activity  . Alcohol use: Not Currently  . Drug use: Not Currently  . Sexual activity: Yes  Other Topics Concern  . 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 aspirin EC 81 MG tablet Take 1 tablet (81 mg total) by mouth daily.    . cilostazol (PLETAL) 100 MG tablet Take 1 tablet (100 mg total) by mouth 2 (two) times daily. 60 tablet 5  . Cinnamon 500 MG TABS Take 500 mg by mouth daily.    . clopidogrel (PLAVIX) 75 MG tablet TAKE 1 TABLET BY MOUTH EVERY DAY (Patient taking differently: Take 75 mg by mouth daily.) 90 tablet 3  . Continuous Blood Gluc Receiver (FREESTYLE LIBRE 14 DAY READER) DEVI Use Freestyle Libre meter to monitor blood sugars. DX:E11.9 1 each 0  . Continuous Blood Gluc Sensor (FREESTYLE LIBRE 14 DAY SENSOR) MISC Apply one sensor once every 14 days to monitor blood sugars. DX:E11.9 2 each 2  . dapagliflozin propanediol (FARXIGA) 5 MG TABS tablet Take 1 tablet (5 mg total) by mouth daily before breakfast. 30 tablet 6  . ezetimibe (ZETIA) 10 MG tablet TAKE 1 TABLET BY MOUTH EVERY DAY (Patient taking differently: Take 10 mg by mouth daily.) 90 tablet 3  . glucose blood (ONETOUCH ULTRA) test strip USE TWICE DAILY AS DIRECTED ( E11.65) 200 each 12  . Insulin Glargine (BASAGLAR KWIKPEN) 100 UNIT/ML Inject 16 Units into the skin daily. 30 mL 4  . Insulin Pen Needle 32G X 4 MM MISC 1 Device by Does not apply route daily. 50 each 6  . metFORMIN (GLUCOPHAGE) 1000 MG tablet Take 1 tablet (1,000 mg total) by mouth daily with breakfast. 90 tablet 3  . nitroGLYCERIN (NITROSTAT) 0.4 MG SL tablet Place 0.4 mg under the tongue every 5 (five) minutes as needed for chest pain.    . pantoprazole (PROTONIX) 40 MG tablet Take 1 tablet (40 mg total) by mouth daily. 90 tablet 3  . pregabalin (LYRICA) 300 MG capsule Take 1 capsule (300 mg total) by mouth at bedtime. 30 capsule 6  . rosuvastatin (CRESTOR) 5 MG tablet TAKE 1  TABLET BY MOUTH EVERY DAY 90 tablet 0  . Saw Palmetto, Serenoa repens, (SAW PALMETTO PO) Take 1 capsule by mouth daily as needed (When having prostate problems).     . sitaGLIPtin (JANUVIA) 100 MG tablet Take 1 tablet (100 mg total) by mouth daily. [place]  tablet 0   No facility-administered medications prior to visit.    Allergies  Allergen Reactions  . Statins Itching    Atorvastatin cause "my skin to feel horrible and unbearable".  . Morphine Nausea And Vomiting    "felt like chest was exploding" pt reports     Review of Systems  Constitutional: Negative for fever and malaise/fatigue.  HENT: Negative for congestion.   Eyes: Negative for blurred vision.  Respiratory: Negative for shortness of breath.   Cardiovascular: Negative for chest pain, palpitations and leg swelling.  Gastrointestinal: Positive for abdominal pain. Negative for blood in stool and nausea.  Genitourinary: Negative for dysuria and frequency.  Musculoskeletal: Negative for falls.  Skin: Negative for rash.  Neurological: Negative for dizziness, loss of consciousness and headaches.  Endo/Heme/Allergies: Negative for environmental allergies.  Psychiatric/Behavioral: Negative for depression. The patient is not nervous/anxious.        Objective:    Physical Exam Vitals and nursing note reviewed.  Constitutional:      General: He is not in acute distress.    Appearance: He is well-developed.  HENT:     Head: Normocephalic and atraumatic.     Nose: Nose normal.  Eyes:     General:        Right eye: No discharge.        Left eye: No discharge.  Cardiovascular:     Rate and Rhythm: Normal rate and regular rhythm.     Heart sounds: No murmur heard.   Pulmonary:     Effort: Pulmonary effort is normal.     Breath sounds: Normal breath sounds.  Abdominal:     General: Bowel sounds are normal.     Palpations: Abdomen is soft.     Tenderness: There is abdominal tenderness. There is no rebound.   Musculoskeletal:     Cervical back: Normal range of motion and neck supple.  Skin:    General: Skin is warm and dry.  Neurological:     Mental Status: He is alert and oriented to person, place, and time.     BP 118/62   Pulse 83   Temp 98.1 F (36.7 C)   Resp 16   Wt 145 lb 6.4 oz (66 kg)   SpO2 97%   BMI 25.76 kg/m  Wt Readings from Last 3 Encounters:  04/01/20 145 lb 6.4 oz (66 kg)  02/08/20 145 lb (65.8 kg)  01/23/20 146 lb (66.2 kg)    Diabetic Foot Exam - Simple   No data filed    Lab Results  Component Value Date   WBC 8.6 08/11/2019   HGB 17.0 02/08/2020   HCT 50.0 02/08/2020   PLT 234.0 08/11/2019   GLUCOSE 267 (H) 02/08/2020   CHOL 160 08/11/2019   TRIG 202.0 (H) 08/11/2019   HDL 39.10 08/11/2019   LDLDIRECT 94.0 08/11/2019   LDLCALC 93 05/01/2019   ALT 12 08/11/2019   AST 10 08/11/2019   NA 138 02/08/2020   K 4.6 02/08/2020   CL 104 02/08/2020   CREATININE 0.70 02/08/2020   BUN 26 (H) 02/08/2020   CO2 29 08/11/2019   PSA 0.19 08/11/2019   HGBA1C 11.0 (A) 12/04/2019   MICROALBUR 2.1 08/11/2019    No results found for: TSH Lab Results  Component Value Date   WBC 8.6 08/11/2019   HGB 17.0 02/08/2020   HCT 50.0 02/08/2020   MCV 89.2 08/11/2019   PLT 234.0 08/11/2019   Lab Results  Component Value Date  NA 138 02/08/2020   K 4.6 02/08/2020   CO2 29 08/11/2019   GLUCOSE 267 (H) 02/08/2020   BUN 26 (H) 02/08/2020   CREATININE 0.70 02/08/2020   BILITOT 0.8 08/11/2019   ALKPHOS 69 08/11/2019   AST 10 08/11/2019   ALT 12 08/11/2019   PROT 7.1 08/11/2019   ALBUMIN 4.4 08/11/2019   CALCIUM 9.8 08/11/2019   GFR 89.97 08/11/2019   Lab Results  Component Value Date   CHOL 160 08/11/2019   Lab Results  Component Value Date   HDL 39.10 08/11/2019   Lab Results  Component Value Date   LDLCALC 93 05/01/2019   Lab Results  Component Value Date   TRIG 202.0 (H) 08/11/2019   Lab Results  Component Value Date   CHOLHDL 4  08/11/2019   Lab Results  Component Value Date   HGBA1C 11.0 (A) 12/04/2019       Assessment & Plan:   Problem List Items Addressed This Visit    Mixed hyperlipidemia   Relevant Orders   Lipid panel   Diabetes mellitus without complication ([place])   Relevant Orders   Hemoglobin A1c   Essential hypertension   Relevant Orders   CBC   Comprehensive metabolic panel   TSH   Tobacco abuse    Encouraged complete cessation. Discussed need to quit as relates to risk of numerous cancers, cardiac and pulmonary disease as well as neurologic complications. Counseled for greater than 3 minutes      Statin intolerance   Relevant Orders   Lipid panel   Type 2 diabetes mellitus with diabetic polyneuropathy, without long-term current use of insulin ([facility])     minimize simple carbs. Increase exercise as tolerated. Continue current meds      Dyslipidemia   Relevant Orders   Lipid panel   Abdominal pain - Primary    Left flank for several weels. Usually about 4 of 10 but spikes when lying down. Check labs, xray and ct scan started on famotidine and carafate      Relevant Orders   DG Abd 2 Views   Amylase   Lipase   H. pylori antibody, IgG   Urinalysis   Urine Culture      I am having Lenwood L. Rathe start on famotidine and sucralfate. I am also having him maintain his aspirin EC, (Saw Palmetto, Serenoa repens, (SAW PALMETTO PO)), pantoprazole, nitroGLYCERIN, OneTouch Ultra, Insulin Pen Needle, FreeStyle Libre 14 Day Sensor, FreeStyle Libre 14 Day Reader, clopidogrel, Cinnamon, ezetimibe, dapagliflozin propanediol, pregabalin, Basaglar KwikPen, metFORMIN, cilostazol, sitaGLIPtin, and rosuvastatin.  Meds ordered this encounter  Medications  . famotidine (PEPCID) 40 MG tablet    Sig: Take 1 tablet (40 mg total) by mouth daily.    Dispense:  30 tablet    Refill:  3  . sucralfate (CARAFATE) 1 GM/10ML suspension    Sig: Take 10 mLs (1 g total) by mouth 4 (four) times daily -  with meals  and at bedtime.    Dispense:  420 mL    Refill:  1     Penni Homans, MD

## 2020-04-01 NOTE — Assessment & Plan Note (Signed)
Encouraged complete cessation. Discussed need to quit as relates to risk of numerous cancers, cardiac and pulmonary disease as well as neurologic complications. Counseled for greater than 3 minutes 

## 2020-04-01 NOTE — Assessment & Plan Note (Signed)
minimize simple carbs. Increase exercise as tolerated. Continue current meds  

## 2020-04-01 NOTE — Patient Instructions (Signed)
Abdominal Pain, Adult Pain in the abdomen (abdominal pain) can be caused by many things. Often, abdominal pain is not serious and it gets better with no treatment or by being treated at home. However, sometimes abdominal pain is serious. Your health care provider will ask questions about your medical history and do a physical exam to try to determine the cause of your abdominal pain. Follow these instructions at home: Medicines  Take over-the-counter and prescription medicines only as told by your health care provider.  Do not take a laxative unless told by your health care provider. General instructions  Watch your condition for any changes.  Drink enough fluid to keep your urine pale yellow.  Keep all follow-up visits as told by your health care provider. This is important.   Contact a health care provider if:  Your abdominal pain changes or gets worse.  You are not hungry or you lose weight without trying.  You are constipated or have diarrhea for more than 2-3 days.  You have pain when you urinate or have a bowel movement.  Your abdominal pain wakes you up at night.  Your pain gets worse with meals, after eating, or with certain foods.  You are vomiting and cannot keep anything down.  You have a fever.  You have blood in your urine. Get help right away if:  Your pain does not go away as soon as your health care provider told you to expect.  You cannot stop vomiting.  Your pain is only in areas of the abdomen, such as the right side or the left lower portion of the abdomen. Pain on the right side could be caused by appendicitis.  You have bloody or black stools, or stools that look like tar.  You have severe pain, cramping, or bloating in your abdomen.  You have signs of dehydration, such as: ? Dark urine, very little urine, or no urine. ? Cracked lips. ? Dry mouth. ? Sunken eyes. ? Sleepiness. ? Weakness.  You have trouble breathing or chest  pain. Summary  Often, abdominal pain is not serious and it gets better with no treatment or by being treated at home. However, sometimes abdominal pain is serious.  Watch your condition for any changes.  Take over-the-counter and prescription medicines only as told by your health care provider.  Contact a health care provider if your abdominal pain changes or gets worse.  Get help right away if you have severe pain, cramping, or bloating in your abdomen. This information is not intended to replace advice given to you by your health care provider. Make sure you discuss any questions you have with your health care provider. Document Revised: 02/10/2019 Document Reviewed: 05/02/2018 Elsevier Patient Education  [employer].

## 2020-04-01 NOTE — Assessment & Plan Note (Signed)
Left flank for several weels. Usually about 4 of 10 but spikes when lying down. Check labs, xray and ct scan started on famotidine and carafate

## 2020-04-02 ENCOUNTER — Other Ambulatory Visit: Payer: Self-pay | Admitting: Family Medicine

## 2020-04-02 DIAGNOSIS — K5900 Constipation, unspecified: Secondary | ICD-10-CM

## 2020-04-02 DIAGNOSIS — R194 Change in bowel habit: Secondary | ICD-10-CM

## 2020-04-02 DIAGNOSIS — R109 Unspecified abdominal pain: Secondary | ICD-10-CM

## 2020-04-02 LAB — COMPREHENSIVE METABOLIC PANEL
ALT: 22 U/L (ref 0–53)
AST: 15 U/L (ref 0–37)
Albumin: 4.4 g/dL (ref 3.5–5.2)
Alkaline Phosphatase: 158 U/L — ABNORMAL HIGH (ref 39–117)
BUN: 18 mg/dL (ref 6–23)
CO2: 26 mEq/L (ref 19–32)
Calcium: 10.2 mg/dL (ref 8.4–10.5)
Chloride: 98 mEq/L (ref 96–112)
Creatinine, Ser: 0.92 mg/dL (ref 0.40–1.50)
GFR: 91.28 mL/min (ref >60.00)
Glucose, Bld: 270 mg/dL — ABNORMAL HIGH (ref 70–99)
Potassium: 4.6 mEq/L (ref 3.5–5.1)
Sodium: 134 mEq/L — ABNORMAL LOW (ref 135–145)
Total Bilirubin: 0.6 mg/dL (ref 0.2–1.2)
Total Protein: 7.5 g/dL (ref 6.0–8.3)

## 2020-04-02 LAB — CBC
HCT: 48.9 % (ref 39.0–52.0)
Hemoglobin: 16.4 g/dL (ref 13.0–17.0)
MCHC: 33.4 g/dL (ref 30.0–36.0)
MCV: 91.4 fl (ref 78.0–100.0)
Platelets: 213 10*3/uL (ref 150.0–400.0)
RBC: 5.36 Mil/uL (ref 4.22–5.81)
RDW: 12.9 % (ref 11.5–15.5)
WBC: 9.1 10*3/uL (ref 4.0–10.5)

## 2020-04-02 LAB — LIPID PANEL
Cholesterol: 130 mg/dL (ref 0–200)
HDL: 39.2 mg/dL (ref >39.00)
NonHDL: 90.92
Total CHOL/HDL Ratio: 3
Triglycerides: 217 mg/dL — ABNORMAL HIGH (ref 0.0–149.0)
VLDL: 43.4 mg/dL — ABNORMAL HIGH (ref 0.0–40.0)

## 2020-04-02 LAB — AMYLASE: Amylase: 11 U/L — ABNORMAL LOW (ref 27–131)

## 2020-04-02 LAB — TSH: TSH: 1.05 u[IU]/mL (ref 0.35–4.50)

## 2020-04-02 LAB — URINALYSIS
Bilirubin Urine: NEGATIVE
Hgb urine dipstick: NEGATIVE
Ketones, ur: NEGATIVE
Leukocytes,Ua: NEGATIVE
Nitrite: NEGATIVE
Specific Gravity, Urine: 1.015 (ref 1.000–1.030)
Total Protein, Urine: NEGATIVE
Urine Glucose: >=1000 — AB
Urobilinogen, UA: 0.2 (ref 0.0–1.0)
pH: 5.5 (ref 5.0–8.0)

## 2020-04-02 LAB — URINE CULTURE
MICRO NUMBER:: 11699640
Result:: NO GROWTH
SPECIMEN QUALITY:: ADEQUATE

## 2020-04-02 LAB — LDL CHOLESTEROL, DIRECT: Direct LDL: 62 mg/dL

## 2020-04-02 LAB — HEMOGLOBIN A1C: Hgb A1c MFr Bld: 10.5 % — ABNORMAL HIGH (ref 4.6–6.5)

## 2020-04-02 LAB — H. PYLORI ANTIBODY, IGG: H Pylori IgG: NEGATIVE

## 2020-04-02 LAB — LIPASE: Lipase: 56 U/L (ref 11.0–59.0)

## 2020-04-03 ENCOUNTER — Other Ambulatory Visit: Payer: Self-pay | Admitting: Family Medicine

## 2020-04-03 ENCOUNTER — Encounter: Payer: Self-pay | Admitting: Internal Medicine

## 2020-04-03 ENCOUNTER — Other Ambulatory Visit: Payer: Self-pay

## 2020-04-03 ENCOUNTER — Telehealth: Payer: Self-pay | Admitting: *Deleted

## 2020-04-03 DIAGNOSIS — E782 Mixed hyperlipidemia: Secondary | ICD-10-CM

## 2020-04-03 DIAGNOSIS — R748 Abnormal levels of other serum enzymes: Secondary | ICD-10-CM

## 2020-04-03 MED ORDER — BASAGLAR KWIKPEN 100 UNIT/ML ~~LOC~~ SOPN
20.0000 [IU] | PEN_INJECTOR | Freq: Every day | SUBCUTANEOUS | 4 refills | Status: AC
Start: 2020-04-03 — End: ?

## 2020-04-03 NOTE — Telephone Encounter (Signed)
Key: [id number] - PA Case ID: [id number] - Rx #: [id number]   Approvedon March 29 Approved. This drug has been approved under the Member's Medicare Part D benefit. Approved quantity: 420 <> per 11 day(s). You may fill up to a 90 day supply except for those on Specialty Tier 5, which can be filled up to a 30 day supply. Please call the pharmacy to process the prescription claim.

## 2020-04-09 ENCOUNTER — Other Ambulatory Visit: Payer: Self-pay | Admitting: Cardiology

## 2020-04-09 ENCOUNTER — Encounter: Payer: Self-pay | Admitting: Family Medicine

## 2020-04-15 ENCOUNTER — Ambulatory Visit (HOSPITAL_BASED_OUTPATIENT_CLINIC_OR_DEPARTMENT_OTHER)
Admission: RE | Admit: 2020-04-15 | Discharge: 2020-04-15 | Disposition: A | Discharge status: Home / Self Care | Payer: Medicare Other | Source: Ambulatory Visit | Attending: Family Medicine | Admitting: Family Medicine

## 2020-04-15 ENCOUNTER — Encounter (HOSPITAL_BASED_OUTPATIENT_CLINIC_OR_DEPARTMENT_OTHER): Payer: Self-pay

## 2020-04-15 ENCOUNTER — Other Ambulatory Visit: Payer: Self-pay

## 2020-04-15 DIAGNOSIS — K5900 Constipation, unspecified: Secondary | ICD-10-CM | POA: Diagnosis present

## 2020-04-15 DIAGNOSIS — R194 Change in bowel habit: Secondary | ICD-10-CM | POA: Diagnosis not present

## 2020-04-15 DIAGNOSIS — R109 Unspecified abdominal pain: Secondary | ICD-10-CM | POA: Diagnosis present

## 2020-04-15 MED ORDER — IOHEXOL 300 MG/ML  SOLN
100.0000 mL | Freq: Once | INTRAMUSCULAR | Status: AC | PRN
  Administered 2020-04-15: 100 mL via INTRAVENOUS

## 2020-04-16 ENCOUNTER — Other Ambulatory Visit: Payer: Self-pay | Admitting: Family Medicine

## 2020-04-16 DIAGNOSIS — C787 Secondary malignant neoplasm of liver and intrahepatic bile duct: Secondary | ICD-10-CM

## 2020-04-16 DIAGNOSIS — K8689 Other specified diseases of pancreas: Secondary | ICD-10-CM

## 2020-04-16 MED ORDER — ONDANSETRON HCL 4 MG PO TABS: 4.0000 mg | ORAL_TABLET | Freq: Three times a day (TID) | ORAL | 0 refills | Status: AC | PRN

## 2020-04-16 MED ORDER — HYDROCODONE-ACETAMINOPHEN 5-325 MG PO TABS: 1.0000 | ORAL_TABLET | Freq: Four times a day (QID) | ORAL | 0 refills | Status: DC | PRN

## 2020-04-17 ENCOUNTER — Encounter: Payer: Self-pay | Admitting: *Deleted

## 2020-04-17 NOTE — Progress Notes (Signed)
Reached out to [place]. Dean Singh to introduce myself as the office RN Navigator and explain our new patient process. Reviewed the reason for their referral and scheduled their new patient appointment along with labs. Provided address and directions to the office including call back phone number. Reviewed with patient any concerns they may have or any possible barriers to attending their appointment.   Informed patient about my role as a navigator and that I will meet with them prior to their New Patient appointment and more fully discuss what services I can provide. At this time patient has no further questions or needs.   Oncology Nurse Navigator Documentation  Oncology Nurse Navigator Flowsheets 04/17/2020  Abnormal Finding Date 04/15/2020  Diagnosis Status Additional Work Up  Navigator Follow Up Date: 04/24/2020  Navigator Follow Up Reason: New Patient Appointment  Navigator Location [facility]  Referral Date to RadOnc/MedOnc 04/17/2020  Navigator Encounter Type Introductory Phone Call  Patient Visit Type MedOnc  Treatment Phase Abnormal Scans  Barriers/Navigation Needs Coordination of Care;Education  Education Other  Interventions Coordination of Care;Education  Acuity Level 2-Minimal Needs (1-2 Barriers Identified)  Coordination of Care Appts  Education Method Verbal;Written  Time Spent with Patient 7

## 2020-04-23 ENCOUNTER — Telehealth: Payer: Self-pay

## 2020-04-23 ENCOUNTER — Encounter: Payer: Self-pay | Admitting: Gastroenterology

## 2020-04-23 ENCOUNTER — Ambulatory Visit (INDEPENDENT_AMBULATORY_CARE_PROVIDER_SITE_OTHER): Payer: Medicare Other | Admitting: Gastroenterology

## 2020-04-23 VITALS — BP 150/70 | HR 76 | Ht 63.0 in | Wt 145.0 lb

## 2020-04-23 DIAGNOSIS — K8689 Other specified diseases of pancreas: Secondary | ICD-10-CM

## 2020-04-23 NOTE — Telephone Encounter (Signed)
[facility] Pre-operative Risk Assessment     Request for surgical clearance:     Endoscopy Procedure  What type of surgery is being performed?     EUS   When is this surgery scheduled?     05-02-2020  What type of clearance is required ?   Pharmacy  Are there any medications that need to be held prior to surgery and how long? Plavix x 5 days   Practice name and name of physician performing surgery?      [facility]  What is your office phone and fax number?      Phone- [phone]  [phone]  Anesthesia type (None, local, MAC, general) ?       MAC

## 2020-04-23 NOTE — Progress Notes (Signed)
HPI: This is a very pleasant 60 year old man who was referred to me by Mosie Lukes, MD  to evaluate abnormal pancreas on CT scan.    He has had left-sided abdominal pain and 30 pound weight loss over the past 6 or 7 months.  He has no trouble eating, no nausea or vomiting.  He is bothered by episodic loose stools and constipation.  Pancreatic disease does not run in his family   Old Data Reviewed: I reviewed his recent CT scan images and also the report  I reviewed his recent blood tests: CBC normal, complete metabolic profile normal except for slightly elevated alkaline phosphatase.    Review of systems: Pertinent positive and negative review of systems were noted in the above HPI section. All other review negative.   Past Medical History:  Diagnosis Date  . Arthritis    DDD  . Bladder cancer ([place])   . Bladder cancer ([place]) 08/17/2018  . CAD in native artery 08/17/2018  . Current nicotine use 08/17/2018  . Diabetes mellitus without complication ([place])   . GERD (gastroesophageal reflux disease)   . H/O: stroke   . Heart attack ([place])    2 stents  . Hx of hemorrhoids 08/17/2018  . Hyperlipidemia   . Hypertension   . Peripheral neuropathy 08/17/2018  . Stroke ([place]) 57  . Visual changes 08/17/2018    Past Surgical History:  Procedure Laterality Date  . ABDOMINAL AORTOGRAM W/LOWER EXTREMITY Bilateral 02/08/2020   Procedure: ABDOMINAL AORTOGRAM W/LOWER EXTREMITY;  Surgeon: Marty Heck, MD;  Location: [facility];  Service: Cardiovascular;  Laterality: Bilateral;  . BLADDER TUMOR EXCISION     cancer  . CAROTID ENDARTERECTOMY Left   . CORONARY ANGIOPLASTY WITH STENT PLACEMENT     2 stent  . CORONARY STENT INTERVENTION N/A 09/02/2018   Procedure: CORONARY STENT INTERVENTION;  Surgeon: Sherren Mocha, MD;  Location: [facility];  Service: Cardiovascular;  Laterality: N/A;  . HERNIA REPAIR     right inguinal 2017  . LEFT HEART CATH AND CORONARY ANGIOGRAPHY  N/A 09/02/2018   Procedure: LEFT HEART CATH AND CORONARY ANGIOGRAPHY;  Surgeon: Sherren Mocha, MD;  Location: [facility];  Service: Cardiovascular;  Laterality: N/A;    Current Outpatient Medications  Medication Sig Dispense Refill  . aspirin EC 81 MG tablet Take 1 tablet (81 mg total) by mouth daily.    . cilostazol (PLETAL) 100 MG tablet Take 1 tablet (100 mg total) by mouth 2 (two) times daily. 60 tablet 5  . Cinnamon 500 MG TABS Take 500 mg by mouth daily.    . clopidogrel (PLAVIX) 75 MG tablet TAKE 1 TABLET BY MOUTH EVERY DAY (Patient taking differently: Take 75 mg by mouth daily.) 90 tablet 3  . Continuous Blood Gluc Receiver (FREESTYLE LIBRE 14 DAY READER) DEVI Use Freestyle Libre meter to monitor blood sugars. DX:E11.9 1 each 0  . Continuous Blood Gluc Sensor (FREESTYLE LIBRE 14 DAY SENSOR) MISC Apply one sensor once every 14 days to monitor blood sugars. DX:E11.9 2 each 2  . dapagliflozin propanediol (FARXIGA) 5 MG TABS tablet Take 1 tablet (5 mg total) by mouth daily before breakfast. 30 tablet 6  . ezetimibe (ZETIA) 10 MG tablet TAKE 1 TABLET BY MOUTH EVERY DAY (Patient taking differently: Take 10 mg by mouth daily.) 90 tablet 3  . famotidine (PEPCID) 40 MG tablet TAKE 1 TABLET BY MOUTH EVERY DAY 90 tablet 1  . glucose blood (ONETOUCH ULTRA) test strip  USE TWICE DAILY AS DIRECTED ( E11.65) 200 each 12  . HYDROcodone-acetaminophen (NORCO) 5-325 MG tablet Take 1-2 tablets by mouth every 6 (six) hours as needed for moderate pain or severe pain. 40 tablet 0  . Insulin Glargine (BASAGLAR KWIKPEN) 100 UNIT/ML Inject 20 Units into the skin daily. 30 mL 4  . Insulin Pen Needle 32G X 4 MM MISC 1 Device by Does not apply route daily. 50 each 6  . metFORMIN (GLUCOPHAGE) 1000 MG tablet Take 1 tablet (1,000 mg total) by mouth daily with breakfast. 90 tablet 3  . nitroGLYCERIN (NITROSTAT) 0.4 MG SL tablet Place 0.4 mg under the tongue every 5 (five) minutes as needed for chest pain.    [name]  ondansetron (ZOFRAN) 4 MG tablet Take 1 tablet (4 mg total) by mouth every 8 (eight) hours as needed for nausea or vomiting. 40 tablet 0  . pantoprazole (PROTONIX) 40 MG tablet Take 1 tablet (40 mg total) by mouth daily. 90 tablet 3  . pregabalin (LYRICA) 300 MG capsule Take 1 capsule (300 mg total) by mouth at bedtime. 30 capsule 6  . rosuvastatin (CRESTOR) 5 MG tablet TAKE 1 TABLET BY MOUTH EVERY DAY 90 tablet 0  . Saw Palmetto, Serenoa repens, (SAW PALMETTO PO) Take 1 capsule by mouth daily as needed (When having prostate problems).     . sitaGLIPtin (JANUVIA) 100 MG tablet Take 1 tablet (100 mg total) by mouth daily. 90 tablet 0  . sucralfate (CARAFATE) 1 GM/10ML suspension Take 10 mLs (1 g total) by mouth 4 (four) times daily -  with meals and at bedtime. 420 mL 1   No current facility-administered medications for this visit.    Allergies as of 04/23/2020 - Review Complete 04/23/2020  Allergen Reaction Noted  . Statins Itching 08/16/2018  . Morphine Nausea And Vomiting 02/13/2016    Family History  Problem Relation Age of Onset  . COPD Mother   . Diabetes Mother   . Dementia Mother   . Heart disease Mother        chf  . Arthritis Mother   . Osteoporosis Mother        h/o cigarette smoking  . Heart disease Father        stents 3 x 2, pacer, chf  . Parkinson's disease Father   . Hyperlipidemia Father   . Diabetes Father   . Arthritis Father        DDD  . Osteoporosis Father        h/o cigarette use  . Addison's disease Sister   . Diabetes Sister   . Hyperlipidemia Sister   . Hypertension Sister   . Obesity Sister   . Hyperlipidemia Brother   . Hypertension Brother   . Diabetes Brother   . Obesity Brother   . Rheumatic fever Son   . Stickler syndrome Son   . Diabetes Maternal Grandmother   . Dementia Maternal Grandmother   . Heart disease Maternal Grandmother        chf  . Hyperlipidemia Maternal Grandmother   . Diabetes Maternal Grandfather   . Dementia  Maternal Grandfather   . Heart disease Maternal Grandfather        cad  . Hyperlipidemia Maternal Grandfather   . Other Paternal Grandmother        scarlet fever  . Prostate cancer Paternal Grandfather   . Obesity Brother     Social History   Socioeconomic History  . Marital status: Married    Spouse  name: Not on file  . Number of children: Not on file  . Years of education: Not on file  . Highest education level: Not on file  Occupational History  . Not on file  Tobacco Use  . Smoking status: Current Every Day Smoker    Packs/day: 1.00    Types: Cigarettes  . Smokeless tobacco: Former Systems developer    Types: Secondary school teacher  . Vaping Use: Never used  Substance and Sexual Activity  . Alcohol use: Not Currently  . Drug use: Not Currently  . Sexual activity: Yes  Other Topics Concern  . 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   Social Determinants of Radio broadcast assistant Strain: Not on file  Food Insecurity: Not on file  Transportation Needs: Not on file  Physical Activity: Not on file  Stress: Not on file  Social Connections: Not on file  Intimate Partner Violence: Not on file     Physical Exam: BP (!) 150/70   Pulse 76   Ht 5\' 3"  (1.6 m)   Wt 145 lb (65.8 kg)   BMI 25.69 kg/m  Constitutional: generally well-appearing Psychiatric: alert and oriented x3 Eyes: extraocular movements intact Mouth: oral pharynx moist, no lesions Neck: supple no lymphadenopathy Cardiovascular: heart regular rate and rhythm Lungs: clear to auscultation bilaterally Abdomen: soft, nontender, nondistended, no obvious ascites, no peritoneal signs, normal bowel sounds Extremities: no lower extremity edema bilaterally Skin: no lesions on visible extremities   Assessment and plan: 60 y.o. male with mass in pancreas and several  masses in the liver, weight loss and left-sided abdominal pains  I had a nice discussion with him and his wife today in the office about his situation.  He understands that he appears to have a metastatic malignancy, most likely pancreatic adenocarcinoma.  He is already scheduled to meet with an oncologist tomorrow.  I recommended endoscopic ultrasound with fine-needle aspiration to obtain a biopsy of the pancreatic tail lesion.  If I am able to also see one of the lesions in his liver I will probably attempt FNA of that as well.  He is on Plavix and knows that it is going to be safest if he stopped that medicine for 5 days prior to the endoscopic ultrasound.  We will contact his cardiologist to make sure that they are okay with that recommendation.  I expect he will need further staging tests, I will leave that to Dr. Antonieta Pert discretion tomorrow.   Please see the "Patient Instructions" section for addition details about the plan.   Owens Loffler, MD [facility] 04/23/2020, 11:10 AM  Cc: Mosie Lukes, MD  Total time on date of encounter was 45  minutes (this included time spent preparing to see the patient reviewing records; obtaining and/or reviewing separately obtained history; performing a medically appropriate exam and/or evaluation; counseling and educating the patient and family if present; ordering medications, tests or procedures if applicable; and documenting clinical information in the health record).

## 2020-04-23 NOTE — Patient Instructions (Addendum)
If you are age 59 or younger, your body mass index should be between 19-25. Your Body mass index is 25.69 kg/m. If this is out of the aformentioned range listed, please consider follow up with your Primary Care Provider.   You will be contacted by our office prior to your procedure for directions on holding your Plavix.  If you do not hear from our office 1 week prior to your scheduled procedure, please call [phone] to discuss.   OK to continue Pletal per Dr Ardis Hughs.  You have been scheduled for an EUS. Please follow written instructions given to you at your visit today. If you use inhalers (even only as needed), please bring them with you on the day of your procedure.  Due to recent changes in healthcare laws, you may see the results of your imaging and laboratory studies on MyChart before your provider has had a chance to review them.  We understand that in some cases there may be results that are confusing or concerning to you. Not all laboratory results come back in the same time frame and the provider may be waiting for multiple results in order to interpret others.  Please give [place] 48 hours in order for your provider to thoroughly review all the results before contacting the office for clarification of your results.   Thank you for entrusting me with your care and choosing [facility].  Dr Ardis Hughs

## 2020-04-24 ENCOUNTER — Encounter: Payer: Self-pay | Admitting: Hematology & Oncology

## 2020-04-24 ENCOUNTER — Inpatient Hospital Stay: Payer: Medicare Other | Attending: Hematology & Oncology

## 2020-04-24 ENCOUNTER — Other Ambulatory Visit: Payer: Medicare Other

## 2020-04-24 ENCOUNTER — Inpatient Hospital Stay (HOSPITAL_BASED_OUTPATIENT_CLINIC_OR_DEPARTMENT_OTHER): Payer: Medicare Other | Admitting: Hematology & Oncology

## 2020-04-24 ENCOUNTER — Other Ambulatory Visit: Payer: Self-pay

## 2020-04-24 VITALS — BP 107/59 | HR 78 | Temp 98.4°F | Resp 16 | Wt 143.0 lb

## 2020-04-24 DIAGNOSIS — Z7189 Other specified counseling: Secondary | ICD-10-CM

## 2020-04-24 DIAGNOSIS — C787 Secondary malignant neoplasm of liver and intrahepatic bile duct: Secondary | ICD-10-CM

## 2020-04-24 DIAGNOSIS — C259 Malignant neoplasm of pancreas, unspecified: Secondary | ICD-10-CM | POA: Insufficient documentation

## 2020-04-24 DIAGNOSIS — C251 Malignant neoplasm of body of pancreas: Secondary | ICD-10-CM

## 2020-04-24 HISTORY — DX: Other specified counseling: Z71.89

## 2020-04-24 HISTORY — DX: Malignant neoplasm of pancreas, unspecified: C78.7

## 2020-04-24 HISTORY — DX: Malignant neoplasm of pancreas, unspecified: C25.9

## 2020-04-24 LAB — CBC WITH DIFFERENTIAL (CANCER CENTER ONLY)
Abs Immature Granulocytes: 0.05 10*3/uL (ref 0.00–0.07)
Basophils Absolute: 0.1 10*3/uL (ref 0.0–0.1)
Basophils Relative: 1 %
Eosinophils Absolute: 0.4 10*3/uL (ref 0.0–0.5)
Eosinophils Relative: 5 %
HCT: 50.2 % (ref 39.0–52.0)
Hemoglobin: 16.9 g/dL (ref 13.0–17.0)
Immature Granulocytes: 1 %
Lymphocytes Relative: 32 %
Lymphs Abs: 2.8 10*3/uL (ref 0.7–4.0)
MCH: 30.2 pg (ref 26.0–34.0)
MCHC: 33.7 g/dL (ref 30.0–36.0)
MCV: 89.8 fL (ref 80.0–100.0)
Monocytes Absolute: 0.7 10*3/uL (ref 0.1–1.0)
Monocytes Relative: 8 %
Neutro Abs: 4.7 10*3/uL (ref 1.7–7.7)
Neutrophils Relative %: 53 %
Platelet Count: 197 10*3/uL (ref 150–400)
RBC: 5.59 MIL/uL (ref 4.22–5.81)
RDW: 12.7 % (ref 11.5–15.5)
WBC Count: 8.7 10*3/uL (ref 4.0–10.5)
nRBC: 0 % (ref 0.0–0.2)

## 2020-04-24 LAB — CMP (CANCER CENTER ONLY)
ALT: 43 U/L (ref 0–44)
AST: 31 U/L (ref 15–41)
Albumin: 4.6 g/dL (ref 3.5–5.0)
Alkaline Phosphatase: 274 U/L — ABNORMAL HIGH (ref 38–126)
Anion gap: 9 (ref 5–15)
BUN: 19 mg/dL (ref 6–20)
CO2: 29 mmol/L (ref 22–32)
Calcium: 10.4 mg/dL — ABNORMAL HIGH (ref 8.9–10.3)
Chloride: 98 mmol/L (ref 98–111)
Creatinine: 1.11 mg/dL (ref 0.61–1.24)
GFR, Estimated: >60 mL/min (ref >60)
Glucose, Bld: 250 mg/dL — ABNORMAL HIGH (ref 70–99)
Potassium: 4.9 mmol/L (ref 3.5–5.1)
Sodium: 136 mmol/L (ref 135–145)
Total Bilirubin: 0.7 mg/dL (ref 0.3–1.2)
Total Protein: 8 g/dL (ref 6.5–8.1)

## 2020-04-24 LAB — PREALBUMIN: Prealbumin: 24.4 mg/dL (ref 18–38)

## 2020-04-24 LAB — LACTATE DEHYDROGENASE: LDH: 183 U/L (ref 98–192)

## 2020-04-24 NOTE — Telephone Encounter (Signed)
I spoke with Dean Singh, he has been doing very well without any recent chest discomfort or shortness of breath.  His last PCI was in August 2020.  Upcoming procedure is a very low risk surgery.  Although patient recently underwent lower extremity angiography, he did not receive any stent or balloon angioplasty.  Office protocol dictate the primary cardiologist to comment on antiplatelet medication, however Dr. Harrell Gave is out of the office this week.  We will check with DOD Dr. Gwenlyn Found.

## 2020-04-24 NOTE — H&P (View-Only) (Signed)
Referral MD  Reason for Referral: Metastatic Pancreatic Adenocarcinoma -- Liver mets.  Chief Complaint  Patient presents with  . New Patient (Initial Visit)  : I have a tumor in my pancreas.  HPI: Dean Singh is a really nice 60 year old white male.  He is originally from [place].  He currently lives in [place].  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  He comes in with his wife.  He does have significant cardiovascular disease.  He has had carotid endarterectomy.  He has had 4 stents placed his heart.  He has been complaining of abdominal pain.  This actually was over on the left side.  He has lost about 30 pounds over the past year.  His wife says that he has stabilized with his weight.  He saw Dr. Charlett Blake, who is his family doctor.  As always, she is incredibly thorough.  She felt that he needed a CT of the abdomen because this pain and weight loss.  This was done on 04/15/2020.  Surprisingly, this showed a 5.1 x 3.3 cm pancreatic tail mass.  It involves the proximal portal vein and proximal SMA.  There is also numerous liver lesions.  The largest measured 2.4 x 2.1 cm in the right lobe of the liver.  Based on this, he was then referred to Gastroenterology.  He is going undergo an upper GI with endoscopic ultrasound and biopsy next week.  He is having some pain still.  He says he takes Tylenol on occasion.  He has hydrocodone but he does not like to take this.  He does smoke.  He is down to a pack and a half a day.  He is trying hard to cut back.  He does not drink alcohol.  He has had no obvious melena or bright red blood per rectum.  He has had no nausea or vomiting.  There is been no leg swelling.  He has had no rashes.  There is no headache.  He has had no chest pain.  There is been no cough.  Had a little bit of discomfort over on the left hip area.  Currently, I would say his performance status is ECOG 1.     Past Medical History:  Diagnosis Date  . Arthritis    DDD  .  Bladder cancer ([place])   . Bladder cancer ([place]) 08/17/2018  . CAD in native artery 08/17/2018  . Current nicotine use 08/17/2018  . Diabetes mellitus without complication ([place])   . GERD (gastroesophageal reflux disease)   . Goals of care, counseling/discussion 04/24/2020  . H/O: stroke   . Heart attack ([place])    2 stents  . Hx of hemorrhoids 08/17/2018  . Hyperlipidemia   . Hypertension   . Pancreatic cancer metastasized to liver ([place]) 04/24/2020  . Peripheral neuropathy 08/17/2018  . Stroke ([place]) 57  . Visual changes 08/17/2018  :  Past Surgical History:  Procedure Laterality Date  . ABDOMINAL AORTOGRAM W/LOWER EXTREMITY Bilateral 02/08/2020   Procedure: ABDOMINAL AORTOGRAM W/LOWER EXTREMITY;  Surgeon: Marty Heck, MD;  Location: [facility];  Service: Cardiovascular;  Laterality: Bilateral;  . BLADDER TUMOR EXCISION     cancer  . CAROTID ENDARTERECTOMY Left   . CORONARY ANGIOPLASTY WITH STENT PLACEMENT     2 stent  . CORONARY STENT INTERVENTION N/A 09/02/2018   Procedure: CORONARY STENT INTERVENTION;  Surgeon: Sherren Mocha, MD;  Location: [facility];  Service: Cardiovascular;  Laterality: N/A;  . HERNIA REPAIR  right inguinal 2017  . LEFT HEART CATH AND CORONARY ANGIOGRAPHY N/A 09/02/2018   Procedure: LEFT HEART CATH AND CORONARY ANGIOGRAPHY;  Surgeon: Sherren Mocha, MD;  Location: [facility];  Service: Cardiovascular;  Laterality: N/A;  :   Current Outpatient Medications:  .  aspirin EC 81 MG tablet, Take 1 tablet (81 mg total) by mouth daily., Disp:  , Rfl:  .  cilostazol (PLETAL) 100 MG tablet, Take 1 tablet (100 mg total) by mouth 2 (two) times daily. (Patient taking differently: Take 100 mg by mouth daily.), Disp: 60 tablet, Rfl: 5 .  clopidogrel (PLAVIX) 75 MG tablet, TAKE 1 TABLET BY MOUTH EVERY DAY (Patient taking differently: Take 75 mg by mouth daily.), Disp: 90 tablet, Rfl: 3 .  Continuous Blood Gluc Receiver (FREESTYLE LIBRE 14 DAY  READER) DEVI, Use Freestyle Libre meter to monitor blood sugars. DX:E11.9, Disp: 1 each, Rfl: 0 .  Continuous Blood Gluc Sensor (FREESTYLE LIBRE 14 DAY SENSOR) MISC, Apply one sensor once every 14 days to monitor blood sugars. DX:E11.9, Disp: 2 each, Rfl: 2 .  dapagliflozin propanediol (FARXIGA) 5 MG TABS tablet, Take 1 tablet (5 mg total) by mouth daily before breakfast., Disp: 30 tablet, Rfl: 6 .  ezetimibe (ZETIA) 10 MG tablet, TAKE 1 TABLET BY MOUTH EVERY DAY (Patient taking differently: Take 10 mg by mouth daily.), Disp: 90 tablet, Rfl: 3 .  famotidine (PEPCID) 40 MG tablet, TAKE 1 TABLET BY MOUTH EVERY DAY (Patient not taking: Reported on 04/23/2020), Disp: 90 tablet, Rfl: 1 .  glucose blood (ONETOUCH ULTRA) test strip, USE TWICE DAILY AS DIRECTED ( E11.65), Disp: 200 each, Rfl: 12 .  HYDROcodone-acetaminophen (NORCO) 5-325 MG tablet, Take 1-2 tablets by mouth every 6 (six) hours as needed for moderate pain or severe pain., Disp: 40 tablet, Rfl: 0 .  Insulin Glargine (BASAGLAR KWIKPEN) 100 UNIT/ML, Inject 20 Units into the skin daily., Disp: 30 mL, Rfl: 4 .  Insulin Pen Needle 32G X 4 MM MISC, 1 Device by Does not apply route daily., Disp: 50 each, Rfl: 6 .  metFORMIN (GLUCOPHAGE) 1000 MG tablet, Take 1 tablet (1,000 mg total) by mouth daily with breakfast., Disp: 90 tablet, Rfl: 3 .  nitroGLYCERIN (NITROSTAT) 0.4 MG SL tablet, Place 0.4 mg under the tongue every 5 (five) minutes as needed for chest pain., Disp: , Rfl:  .  ondansetron (ZOFRAN) 4 MG tablet, Take 1 tablet (4 mg total) by mouth every 8 (eight) hours as needed for nausea or vomiting., Disp: 40 tablet, Rfl: 0 .  pantoprazole (PROTONIX) 40 MG tablet, Take 1 tablet (40 mg total) by mouth daily., Disp: 90 tablet, Rfl: 3 .  pregabalin (LYRICA) 300 MG capsule, Take 1 capsule (300 mg total) by mouth at bedtime., Disp: 30 capsule, Rfl: 6 .  rosuvastatin (CRESTOR) 5 MG tablet, TAKE 1 TABLET BY MOUTH EVERY DAY (Patient taking differently: Take  5 mg by mouth daily.), Disp: 90 tablet, Rfl: 0 .  Saw Palmetto, Serenoa repens, (SAW PALMETTO PO), Take 1 capsule by mouth daily as needed (When having prostate problems). , Disp: , Rfl:  .  sitaGLIPtin (JANUVIA) 100 MG tablet, Take 1 tablet (100 mg total) by mouth daily., Disp: 90 tablet, Rfl: 0 .  Wheat Dextrin (BENEFIBER PO), Take 1 Scoop by mouth daily., Disp: , Rfl: :  :  Allergies  Allergen Reactions  . Statins Itching    Atorvastatin cause "my skin to feel horrible and unbearable".  . Morphine Nausea And Vomiting    "  felt like chest was exploding" pt reports   :  Family History  Problem Relation Age of Onset  . COPD Mother   . Diabetes Mother   . Dementia Mother   . Heart disease Mother        chf  . Arthritis Mother   . Osteoporosis Mother        h/o cigarette smoking  . Heart disease Father        stents 3 x 2, pacer, chf  . Parkinson's disease Father   . Hyperlipidemia Father   . Diabetes Father   . Arthritis Father        DDD  . Osteoporosis Father        h/o cigarette use  . Addison's disease Sister   . Diabetes Sister   . Hyperlipidemia Sister   . Hypertension Sister   . Obesity Sister   . Hyperlipidemia Brother   . Hypertension Brother   . Diabetes Brother   . Obesity Brother   . Rheumatic fever Son   . Stickler syndrome Son   . Diabetes Maternal Grandmother   . Dementia Maternal Grandmother   . Heart disease Maternal Grandmother        chf  . Hyperlipidemia Maternal Grandmother   . Diabetes Maternal Grandfather   . Dementia Maternal Grandfather   . Heart disease Maternal Grandfather        cad  . Hyperlipidemia Maternal Grandfather   . Other Paternal Grandmother        scarlet fever  . Prostate cancer Paternal Grandfather   . Obesity Brother   :  Social History   Socioeconomic History  . Marital status: Married    Spouse name: Not on file  . Number of children: Not on file  . Years of education: Not on file  . Highest education  level: Not on file  Occupational History  . Not on file  Tobacco Use  . Smoking status: Current Every Day Smoker    Packs/day: 1.00    Types: Cigarettes  . Smokeless tobacco: Former Systems developer    Types: Secondary school teacher  . Vaping Use: Never used  Substance and Sexual Activity  . Alcohol use: Not Currently  . Drug use: Not Currently  . Sexual activity: Yes  Other Topics Concern  . 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  HENT: Negative.   Eyes: Negative.   Respiratory: Negative.   Cardiovascular: Negative.   Gastrointestinal: Positive for abdominal pain.  Genitourinary: Negative.   Musculoskeletal: Positive for joint pain.  Skin: Negative.   Neurological: Negative.   Endo/Heme/Allergies: Negative.   Psychiatric/Behavioral: Negative.    Exam:  This is a well-developed well-nourished white male in no obvious distress.  Vital signs are temperature of 98.4.  Pulse 78.  Blood pressure 107/59.  Weight is 143 pounds.  Head and neck exam shows no ocular or oral lesions.  There are no palpable cervical or supraclavicular lymph nodes.  Lungs are  clear bilaterally.  Cardiac exam regular rate and rhythm with no murmurs, rubs or bruits.  Abdomen is soft.  He has good bowel sounds.  There is no fluid wave.  There is no palpable abdominal mass.  There is no palpable liver or spleen tip.  Back exam shows no tenderness over the spine, ribs or hips.  Extremities shows no clubbing, cyanosis or edema.  Neurological exam  shows no focal neurological deficits.  Skin exam shows no rashes, ecchymoses or petechia.   @IPVITALS @   Recent Labs    04/24/20 1108  WBC 8.7  HGB 16.9  HCT 50.2  PLT 197   Recent Labs    04/24/20 1108  NA 136  K 4.9  CL 98  CO2 29  GLUCOSE 250*  BUN 19  CREATININE 1.11  CALCIUM 10.4*    Blood smear review: None  Pathology: Pending    Assessment and Plan: Dean Singh is a really nice 61 year old white male.  He has, in all likelihood, metastatic pancreatic cancer.  It will be very interesting to see what his CA 19-9 is.  He will have this biopsy next week.  Hopefully, Dr. Ardis Hughs will be able to get enough tissue for [place] so we can do molecular markers.  Clearly, he is in good shape to handle chemotherapy.  We will offer him chemotherapy.  Again I would be shocked if the molecular markers would show any targeted mutations that we might be able to use other therapies for.  He does need to have a CT scan of the chest.  I do not think that a PET scan really is going to help [place] out right now.  I spent a good hour with he and his wife.  They are both very nice.  He has a very strong faith.  I gave him a prayer blanket.  Once we have the pathology back on the biopsy, then we will get him back in so we can make recommendations for chemotherapy.  I did tell him that what we are dealing with is treatable but not curable.  I really do not give any type of prognosis with respect to time because I do not know what we are dealing with as of yet.

## 2020-04-24 NOTE — Progress Notes (Signed)
Referral MD  Reason for Referral: Metastatic Pancreatic Adenocarcinoma -- Liver mets.  Chief Complaint  Patient presents with  . New Patient (Initial Visit)  : I have a tumor in my pancreas.  HPI: Dean Singh is a really nice 60 year old white male.  He is originally from [place].  He currently lives in [place].  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  He comes in with his wife.  He does have significant cardiovascular disease.  He has had carotid endarterectomy.  He has had 4 stents placed his heart.  He has been complaining of abdominal pain.  This actually was over on the left side.  He has lost about 30 pounds over the past year.  His wife says that he has stabilized with his weight.  He saw Dr. Charlett Blake, who is his family doctor.  As always, she is incredibly thorough.  She felt that he needed a CT of the abdomen because this pain and weight loss.  This was done on 04/15/2020.  Surprisingly, this showed a 5.1 x 3.3 cm pancreatic tail mass.  It involves the proximal portal vein and proximal SMA.  There is also numerous liver lesions.  The largest measured 2.4 x 2.1 cm in the right lobe of the liver.  Based on this, he was then referred to Gastroenterology.  He is going undergo an upper GI with endoscopic ultrasound and biopsy next week.  He is having some pain still.  He says he takes Tylenol on occasion.  He has hydrocodone but he does not like to take this.  He does smoke.  He is down to a pack and a half a day.  He is trying hard to cut back.  He does not drink alcohol.  He has had no obvious melena or bright red blood per rectum.  He has had no nausea or vomiting.  There is been no leg swelling.  He has had no rashes.  There is no headache.  He has had no chest pain.  There is been no cough.  Had a little bit of discomfort over on the left hip area.  Currently, I would say his performance status is ECOG 1.     Past Medical History:  Diagnosis Date  . Arthritis    DDD  .  Bladder cancer ([place])   . Bladder cancer ([place]) 08/17/2018  . CAD in native artery 08/17/2018  . Current nicotine use 08/17/2018  . Diabetes mellitus without complication ([place])   . GERD (gastroesophageal reflux disease)   . Goals of care, counseling/discussion 04/24/2020  . H/O: stroke   . Heart attack ([place])    2 stents  . Hx of hemorrhoids 08/17/2018  . Hyperlipidemia   . Hypertension   . Pancreatic cancer metastasized to liver ([place]) 04/24/2020  . Peripheral neuropathy 08/17/2018  . Stroke ([place]) 60  . Visual changes 08/17/2018  :  Past Surgical History:  Procedure Laterality Date  . ABDOMINAL AORTOGRAM W/LOWER EXTREMITY Bilateral 02/08/2020   Procedure: ABDOMINAL AORTOGRAM W/LOWER EXTREMITY;  Surgeon: Marty Heck, MD;  Location: [facility];  Service: Cardiovascular;  Laterality: Bilateral;  . BLADDER TUMOR EXCISION     cancer  . CAROTID ENDARTERECTOMY Left   . CORONARY ANGIOPLASTY WITH STENT PLACEMENT     2 stent  . CORONARY STENT INTERVENTION N/A 09/02/2018   Procedure: CORONARY STENT INTERVENTION;  Surgeon: Sherren Mocha, MD;  Location: [facility];  Service: Cardiovascular;  Laterality: N/A;  . HERNIA REPAIR  right inguinal 2017  . LEFT HEART CATH AND CORONARY ANGIOGRAPHY N/A 09/02/2018   Procedure: LEFT HEART CATH AND CORONARY ANGIOGRAPHY;  Surgeon: Sherren Mocha, MD;  Location: [facility];  Service: Cardiovascular;  Laterality: N/A;  :   Current Outpatient Medications:  .  aspirin EC 81 MG tablet, Take 1 tablet (81 mg total) by mouth daily., Disp:  , Rfl:  .  cilostazol (PLETAL) 100 MG tablet, Take 1 tablet (100 mg total) by mouth 2 (two) times daily. (Patient taking differently: Take 100 mg by mouth daily.), Disp: 60 tablet, Rfl: 5 .  clopidogrel (PLAVIX) 75 MG tablet, TAKE 1 TABLET BY MOUTH EVERY DAY (Patient taking differently: Take 75 mg by mouth daily.), Disp: 90 tablet, Rfl: 3 .  Continuous Blood Gluc Receiver (FREESTYLE LIBRE 14 DAY  READER) DEVI, Use Freestyle Libre meter to monitor blood sugars. DX:E11.9, Disp: 1 each, Rfl: 0 .  Continuous Blood Gluc Sensor (FREESTYLE LIBRE 14 DAY SENSOR) MISC, Apply one sensor once every 14 days to monitor blood sugars. DX:E11.9, Disp: 2 each, Rfl: 2 .  dapagliflozin propanediol (FARXIGA) 5 MG TABS tablet, Take 1 tablet (5 mg total) by mouth daily before breakfast., Disp: 30 tablet, Rfl: 6 .  ezetimibe (ZETIA) 10 MG tablet, TAKE 1 TABLET BY MOUTH EVERY DAY (Patient taking differently: Take 10 mg by mouth daily.), Disp: 90 tablet, Rfl: 3 .  famotidine (PEPCID) 40 MG tablet, TAKE 1 TABLET BY MOUTH EVERY DAY (Patient not taking: Reported on 04/23/2020), Disp: 90 tablet, Rfl: 1 .  glucose blood (ONETOUCH ULTRA) test strip, USE TWICE DAILY AS DIRECTED ( E11.65), Disp: 200 each, Rfl: 12 .  HYDROcodone-acetaminophen (NORCO) 5-325 MG tablet, Take 1-2 tablets by mouth every 6 (six) hours as needed for moderate pain or severe pain., Disp: 40 tablet, Rfl: 0 .  Insulin Glargine (BASAGLAR KWIKPEN) 100 UNIT/ML, Inject 20 Units into the skin daily., Disp: 30 mL, Rfl: 4 .  Insulin Pen Needle 32G X 4 MM MISC, 1 Device by Does not apply route daily., Disp: 50 each, Rfl: 6 .  metFORMIN (GLUCOPHAGE) 1000 MG tablet, Take 1 tablet (1,000 mg total) by mouth daily with breakfast., Disp: 90 tablet, Rfl: 3 .  nitroGLYCERIN (NITROSTAT) 0.4 MG SL tablet, Place 0.4 mg under the tongue every 5 (five) minutes as needed for chest pain., Disp: , Rfl:  .  ondansetron (ZOFRAN) 4 MG tablet, Take 1 tablet (4 mg total) by mouth every 8 (eight) hours as needed for nausea or vomiting., Disp: 40 tablet, Rfl: 0 .  pantoprazole (PROTONIX) 40 MG tablet, Take 1 tablet (40 mg total) by mouth daily., Disp: 90 tablet, Rfl: 3 .  pregabalin (LYRICA) 300 MG capsule, Take 1 capsule (300 mg total) by mouth at bedtime., Disp: 30 capsule, Rfl: 6 .  rosuvastatin (CRESTOR) 5 MG tablet, TAKE 1 TABLET BY MOUTH EVERY DAY (Patient taking differently: Take  5 mg by mouth daily.), Disp: 90 tablet, Rfl: 0 .  Saw Palmetto, Serenoa repens, (SAW PALMETTO PO), Take 1 capsule by mouth daily as needed (When having prostate problems). , Disp: , Rfl:  .  sitaGLIPtin (JANUVIA) 100 MG tablet, Take 1 tablet (100 mg total) by mouth daily., Disp: 90 tablet, Rfl: 0 .  Wheat Dextrin (BENEFIBER PO), Take 1 Scoop by mouth daily., Disp: , Rfl: :  :  Allergies  Allergen Reactions  . Statins Itching    Atorvastatin cause "my skin to feel horrible and unbearable".  . Morphine Nausea And Vomiting    "  felt like chest was exploding" pt reports   :  Family History  Problem Relation Age of Onset  . COPD Mother   . Diabetes Mother   . Dementia Mother   . Heart disease Mother        chf  . Arthritis Mother   . Osteoporosis Mother        h/o cigarette smoking  . Heart disease Father        stents 3 x 2, pacer, chf  . Parkinson's disease Father   . Hyperlipidemia Father   . Diabetes Father   . Arthritis Father        DDD  . Osteoporosis Father        h/o cigarette use  . Addison's disease Sister   . Diabetes Sister   . Hyperlipidemia Sister   . Hypertension Sister   . Obesity Sister   . Hyperlipidemia Brother   . Hypertension Brother   . Diabetes Brother   . Obesity Brother   . Rheumatic fever Son   . Stickler syndrome Son   . Diabetes Maternal Grandmother   . Dementia Maternal Grandmother   . Heart disease Maternal Grandmother        chf  . Hyperlipidemia Maternal Grandmother   . Diabetes Maternal Grandfather   . Dementia Maternal Grandfather   . Heart disease Maternal Grandfather        cad  . Hyperlipidemia Maternal Grandfather   . Other Paternal Grandmother        scarlet fever  . Prostate cancer Paternal Grandfather   . Obesity Brother   :  Social History   Socioeconomic History  . Marital status: Married    Spouse name: Not on file  . Number of children: Not on file  . Years of education: Not on file  . Highest education  level: Not on file  Occupational History  . Not on file  Tobacco Use  . Smoking status: Current Every Day Smoker    Packs/day: 1.00    Types: Cigarettes  . Smokeless tobacco: Former Systems developer    Types: Secondary school teacher  . Vaping Use: Never used  Substance and Sexual Activity  . Alcohol use: Not Currently  . Drug use: Not Currently  . Sexual activity: Yes  Other Topics Concern  . 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  HENT: Negative.   Eyes: Negative.   Respiratory: Negative.   Cardiovascular: Negative.   Gastrointestinal: Positive for abdominal pain.  Genitourinary: Negative.   Musculoskeletal: Positive for joint pain.  Skin: Negative.   Neurological: Negative.   Endo/Heme/Allergies: Negative.   Psychiatric/Behavioral: Negative.    Exam:  This is a well-developed well-nourished white male in no obvious distress.  Vital signs are temperature of 98.4.  Pulse 78.  Blood pressure 107/59.  Weight is 143 pounds.  Head and neck exam shows no ocular or oral lesions.  There are no palpable cervical or supraclavicular lymph nodes.  Lungs are  clear bilaterally.  Cardiac exam regular rate and rhythm with no murmurs, rubs or bruits.  Abdomen is soft.  He has good bowel sounds.  There is no fluid wave.  There is no palpable abdominal mass.  There is no palpable liver or spleen tip.  Back exam shows no tenderness over the spine, ribs or hips.  Extremities shows no clubbing, cyanosis or edema.  Neurological exam  shows no focal neurological deficits.  Skin exam shows no rashes, ecchymoses or petechia.   @IPVITALS @   Recent Labs    04/24/20 1108  WBC 8.7  HGB 16.9  HCT 50.2  PLT 197   Recent Labs    04/24/20 1108  NA 136  K 4.9  CL 98  CO2 29  GLUCOSE 250*  BUN 19  CREATININE 1.11  CALCIUM 10.4*    Blood smear review: None  Pathology: Pending    Assessment and Plan: Dean Singh is a really nice 60 year old white male.  He has, in all likelihood, metastatic pancreatic cancer.  It will be very interesting to see what his CA 19-9 is.  He will have this biopsy next week.  Hopefully, Dr. Ardis Hughs will be able to get enough tissue for [place] so we can do molecular markers.  Clearly, he is in good shape to handle chemotherapy.  We will offer him chemotherapy.  Again I would be shocked if the molecular markers would show any targeted mutations that we might be able to use other therapies for.  He does need to have a CT scan of the chest.  I do not think that a PET scan really is going to help [place] out right now.  I spent a good hour with he and his wife.  They are both very nice.  He has a very strong faith.  I gave him a prayer blanket.  Once we have the pathology back on the biopsy, then we will get him back in so we can make recommendations for chemotherapy.  I did tell him that what we are dealing with is treatable but not curable.  I really do not give any type of prognosis with respect to time because I do not know what we are dealing with as of yet.

## 2020-04-24 NOTE — Telephone Encounter (Signed)
Spoke with DOD, Dr. Gwenlyn Found, patient is cleared to proceed with GI procedure and hold Plavix for 5 days.  He will need to restart the Plavix as soon as possible afterward at the surgeon's discretion.

## 2020-04-25 ENCOUNTER — Telehealth: Payer: Self-pay

## 2020-04-25 ENCOUNTER — Encounter: Payer: Self-pay | Admitting: *Deleted

## 2020-04-25 LAB — CANCER ANTIGEN 19-9: CA 19-9: 87710 U/mL — ABNORMAL HIGH (ref 0–35)

## 2020-04-25 NOTE — Progress Notes (Signed)
Patient was seen by Dr Marin Olp yesterday. He is scheduled for an EUS on 05/02/2020. Biopsy will be sent for oncoType MAP testing. He will also need a CT of the chest to complete staging.  CT scheduled. Patient called and appointment date, time and location reviewed with patient. Also instructed on NPO 4hrs prior.   Oncology Nurse Navigator Documentation  Oncology Nurse Navigator Flowsheets 04/25/2020  Abnormal Finding Date -  Diagnosis Status -  Navigator Follow Up Date: 04/29/2020  Navigator Follow Up Reason: Appointment Review;Scan Review  Navigator Location [facility]  Referral Date to RadOnc/MedOnc -  Navigator Encounter Type Appt/Treatment Plan Review;Telephone  Telephone Appt Confirmation/Clarification;Outgoing Call  Patient Visit Type MedOnc  Treatment Phase Abnormal Scans  Barriers/Navigation Needs Coordination of Care;Education  Education Other  Interventions Coordination of Care;Education  Acuity Level 2-Minimal Needs (1-2 Barriers Identified)  Coordination of Care Appts;Radiology  Education Method Verbal  Support Groups/Services Friends and Family  Time Spent with Patient 67

## 2020-04-25 NOTE — Telephone Encounter (Signed)
No 04/24/20 LOS   Dean Singh 

## 2020-04-25 NOTE — Telephone Encounter (Signed)
Patient advised that he has been given clearance to hold Plavix 5 days prior to EUS scheduled for 05-02-2020.  Patient advised to take last dose of Plavix on 04-26-2020, and he will be advised when to restart Plavix by Dr Ardis Hughs after the procedure.  Patient agreed to plan and verbalized understanding.  No further questions.

## 2020-04-26 ENCOUNTER — Encounter (HOSPITAL_COMMUNITY): Payer: Self-pay | Admitting: Gastroenterology

## 2020-04-26 ENCOUNTER — Other Ambulatory Visit: Payer: Self-pay

## 2020-04-29 ENCOUNTER — Other Ambulatory Visit: Payer: Self-pay

## 2020-04-29 ENCOUNTER — Other Ambulatory Visit (HOSPITAL_COMMUNITY)
Admission: RE | Admit: 2020-04-29 | Discharge: 2020-04-29 | Disposition: A | Discharge status: Home / Self Care | Payer: Medicare Other | Source: Ambulatory Visit | Attending: Gastroenterology | Admitting: Gastroenterology

## 2020-04-29 ENCOUNTER — Encounter (HOSPITAL_BASED_OUTPATIENT_CLINIC_OR_DEPARTMENT_OTHER): Payer: Self-pay

## 2020-04-29 ENCOUNTER — Encounter: Payer: Self-pay | Admitting: *Deleted

## 2020-04-29 ENCOUNTER — Ambulatory Visit (HOSPITAL_BASED_OUTPATIENT_CLINIC_OR_DEPARTMENT_OTHER)
Admission: RE | Admit: 2020-04-29 | Discharge: 2020-04-29 | Disposition: A | Discharge status: Home / Self Care | Payer: Medicare Other | Source: Ambulatory Visit | Attending: Hematology & Oncology | Admitting: Hematology & Oncology

## 2020-04-29 DIAGNOSIS — Z20822 Contact with and (suspected) exposure to covid-19: Secondary | ICD-10-CM | POA: Insufficient documentation

## 2020-04-29 DIAGNOSIS — C259 Malignant neoplasm of pancreas, unspecified: Secondary | ICD-10-CM | POA: Diagnosis present

## 2020-04-29 DIAGNOSIS — C787 Secondary malignant neoplasm of liver and intrahepatic bile duct: Secondary | ICD-10-CM | POA: Diagnosis present

## 2020-04-29 DIAGNOSIS — Z01812 Encounter for preprocedural laboratory examination: Secondary | ICD-10-CM | POA: Insufficient documentation

## 2020-04-29 MED ORDER — IOHEXOL 300 MG/ML  SOLN
100.0000 mL | Freq: Once | INTRAMUSCULAR | Status: AC | PRN
  Administered 2020-04-29: 75 mL via INTRAVENOUS

## 2020-04-29 NOTE — Progress Notes (Signed)
Oncology Nurse Navigator Documentation  Oncology Nurse Navigator Flowsheets 04/29/2020  Abnormal Finding Date -  Diagnosis Status -  Navigator Follow Up Date: 05/02/2020  Navigator Follow Up Reason: Surgery  Navigator Location [facility]  Referral Date to RadOnc/MedOnc -  Navigator Encounter Type Scan Review  Telephone -  Patient Visit Type MedOnc  Treatment Phase Abnormal Scans  Barriers/Navigation Needs Coordination of Care;Education  Education -  Interventions None Required  Acuity Level 2-Minimal Needs (1-2 Barriers Identified)  Coordination of Care -  Education Method -  Support Groups/Services Friends and Family  Time Spent with Patient 15

## 2020-04-30 LAB — SARS CORONAVIRUS 2 (TAT 6-24 HRS): SARS Coronavirus 2: NEGATIVE

## 2020-05-01 ENCOUNTER — Encounter (HOSPITAL_COMMUNITY): Payer: Self-pay | Admitting: Gastroenterology

## 2020-05-01 NOTE — Anesthesia Preprocedure Evaluation (Addendum)
Anesthesia Evaluation  Patient identified by MRN, date of birth, ID band Patient awake    Reviewed: Allergy & Precautions, NPO status , Patient's Chart, lab work & pertinent test results  Airway Mallampati: I  TM Distance: >3 FB Neck ROM: Full    Dental  (+) Edentulous Upper, Edentulous Lower   Pulmonary Current Smoker,    breath sounds clear to auscultation       Cardiovascular hypertension, + CAD, + Past MI, + Cardiac Stents and + Peripheral Vascular Disease   Rhythm:Regular Rate:Normal     Neuro/Psych Seizures -,  CVA negative psych ROS   GI/Hepatic Neg liver ROS, GERD  Medicated,  Endo/Other  diabetes, Type 2, Insulin Dependent, Oral Hypoglycemic Agents  Renal/GU negative Renal ROS     Musculoskeletal   Abdominal Normal abdominal exam  (+)   Peds  Hematology   Anesthesia Other Findings - HLD  Reproductive/Obstetrics                            Anesthesia Physical Anesthesia Plan  ASA: III  Anesthesia Plan: MAC   Post-op Pain Management:    Induction: Intravenous  PONV Risk Score and Plan: 0 and Propofol infusion  Airway Management Planned: Natural Airway and Nasal Cannula  Additional Equipment: None  Intra-op Plan:   Post-operative Plan:   Informed Consent: I have reviewed the patients History and Physical, chart, labs and discussed the procedure including the risks, benefits and alternatives for the proposed anesthesia with the patient or authorized representative who has indicated his/her understanding and acceptance.       Plan Discussed with: CRNA  Anesthesia Plan Comments:        Anesthesia Quick Evaluation

## 2020-05-02 ENCOUNTER — Encounter: Payer: Self-pay | Admitting: *Deleted

## 2020-05-02 ENCOUNTER — Ambulatory Visit (HOSPITAL_COMMUNITY)
Admission: RE | Admit: 2020-05-02 | Discharge: 2020-05-02 | Disposition: A | Discharge status: Home / Self Care | Payer: Medicare Other | Source: Ambulatory Visit | Attending: Gastroenterology | Admitting: Gastroenterology

## 2020-05-02 ENCOUNTER — Encounter (HOSPITAL_COMMUNITY): Payer: Self-pay | Admitting: Gastroenterology

## 2020-05-02 ENCOUNTER — Ambulatory Visit (HOSPITAL_COMMUNITY): Payer: Medicare Other | Admitting: Anesthesiology

## 2020-05-02 ENCOUNTER — Encounter (HOSPITAL_COMMUNITY)
Admission: RE | Disposition: A | Discharge status: Home / Self Care | Payer: Self-pay | Source: Ambulatory Visit | Attending: Gastroenterology

## 2020-05-02 ENCOUNTER — Other Ambulatory Visit: Payer: Self-pay

## 2020-05-02 DIAGNOSIS — C787 Secondary malignant neoplasm of liver and intrahepatic bile duct: Secondary | ICD-10-CM | POA: Diagnosis not present

## 2020-05-02 DIAGNOSIS — I252 Old myocardial infarction: Secondary | ICD-10-CM | POA: Diagnosis not present

## 2020-05-02 DIAGNOSIS — Z79899 Other long term (current) drug therapy: Secondary | ICD-10-CM | POA: Diagnosis not present

## 2020-05-02 DIAGNOSIS — K8689 Other specified diseases of pancreas: Secondary | ICD-10-CM

## 2020-05-02 DIAGNOSIS — Z833 Family history of diabetes mellitus: Secondary | ICD-10-CM | POA: Insufficient documentation

## 2020-05-02 DIAGNOSIS — Z794 Long term (current) use of insulin: Secondary | ICD-10-CM | POA: Diagnosis not present

## 2020-05-02 DIAGNOSIS — Z885 Allergy status to narcotic agent status: Secondary | ICD-10-CM | POA: Diagnosis not present

## 2020-05-02 DIAGNOSIS — E1142 Type 2 diabetes mellitus with diabetic polyneuropathy: Secondary | ICD-10-CM | POA: Insufficient documentation

## 2020-05-02 DIAGNOSIS — R933 Abnormal findings on diagnostic imaging of other parts of digestive tract: Secondary | ICD-10-CM | POA: Diagnosis present

## 2020-05-02 DIAGNOSIS — Z8249 Family history of ischemic heart disease and other diseases of the circulatory system: Secondary | ICD-10-CM | POA: Insufficient documentation

## 2020-05-02 DIAGNOSIS — I10 Essential (primary) hypertension: Secondary | ICD-10-CM | POA: Insufficient documentation

## 2020-05-02 DIAGNOSIS — Z8042 Family history of malignant neoplasm of prostate: Secondary | ICD-10-CM | POA: Insufficient documentation

## 2020-05-02 DIAGNOSIS — Z7982 Long term (current) use of aspirin: Secondary | ICD-10-CM | POA: Insufficient documentation

## 2020-05-02 DIAGNOSIS — Z8673 Personal history of transient ischemic attack (TIA), and cerebral infarction without residual deficits: Secondary | ICD-10-CM | POA: Insufficient documentation

## 2020-05-02 DIAGNOSIS — Z8719 Personal history of other diseases of the digestive system: Secondary | ICD-10-CM | POA: Diagnosis not present

## 2020-05-02 DIAGNOSIS — E785 Hyperlipidemia, unspecified: Secondary | ICD-10-CM | POA: Diagnosis not present

## 2020-05-02 DIAGNOSIS — Z8349 Family history of other endocrine, nutritional and metabolic diseases: Secondary | ICD-10-CM | POA: Insufficient documentation

## 2020-05-02 DIAGNOSIS — I2510 Atherosclerotic heart disease of native coronary artery without angina pectoris: Secondary | ICD-10-CM | POA: Insufficient documentation

## 2020-05-02 DIAGNOSIS — F17210 Nicotine dependence, cigarettes, uncomplicated: Secondary | ICD-10-CM | POA: Insufficient documentation

## 2020-05-02 DIAGNOSIS — C252 Malignant neoplasm of tail of pancreas: Secondary | ICD-10-CM | POA: Insufficient documentation

## 2020-05-02 DIAGNOSIS — Z7984 Long term (current) use of oral hypoglycemic drugs: Secondary | ICD-10-CM | POA: Diagnosis not present

## 2020-05-02 DIAGNOSIS — Z955 Presence of coronary angioplasty implant and graft: Secondary | ICD-10-CM | POA: Diagnosis not present

## 2020-05-02 DIAGNOSIS — Z888 Allergy status to other drugs, medicaments and biological substances status: Secondary | ICD-10-CM | POA: Diagnosis not present

## 2020-05-02 DIAGNOSIS — Z7902 Long term (current) use of antithrombotics/antiplatelets: Secondary | ICD-10-CM | POA: Insufficient documentation

## 2020-05-02 HISTORY — PX: FINE NEEDLE ASPIRATION: SHX5430

## 2020-05-02 HISTORY — PX: ESOPHAGOGASTRODUODENOSCOPY (EGD) WITH PROPOFOL: SHX5813

## 2020-05-02 HISTORY — PX: UPPER ESOPHAGEAL ENDOSCOPIC ULTRASOUND (EUS): SHX6562

## 2020-05-02 LAB — GLUCOSE, CAPILLARY: Glucose-Capillary: 205 mg/dL — ABNORMAL HIGH (ref 70–99)

## 2020-05-02 SURGERY — UPPER ESOPHAGEAL ENDOSCOPIC ULTRASOUND (EUS)
Anesthesia: Monitor Anesthesia Care

## 2020-05-02 MED ORDER — PHENYLEPHRINE HCL (PRESSORS) 10 MG/ML IV SOLN
INTRAVENOUS | Status: DC | PRN
  Administered 2020-05-02: 80 ug via INTRAVENOUS

## 2020-05-02 MED ORDER — LACTATED RINGERS IV SOLN
INTRAVENOUS | Status: DC
  Administered 2020-05-02: 1000 mL via INTRAVENOUS

## 2020-05-02 MED ORDER — SODIUM CHLORIDE 0.9 % IV SOLN: INTRAVENOUS | Status: DC

## 2020-05-02 MED ORDER — PROPOFOL 500 MG/50ML IV EMUL
INTRAVENOUS | Status: DC | PRN
  Administered 2020-05-02 (×2): 30 mg via INTRAVENOUS

## 2020-05-02 MED ORDER — PROPOFOL 500 MG/50ML IV EMUL
INTRAVENOUS | Status: DC | PRN
  Administered 2020-05-02: 150 ug/kg/min via INTRAVENOUS

## 2020-05-02 NOTE — Interval H&P Note (Signed)
History and Physical Interval Note:  05/02/2020 7:12 AM  Dean Singh  has presented today for surgery, with the diagnosis of pancreatic mass.  The various methods of treatment have been discussed with the patient and family. After consideration of risks, benefits and other options for treatment, the patient has consented to  Procedure(s): UPPER ESOPHAGEAL ENDOSCOPIC ULTRASOUND (EUS) (N/A) as a surgical intervention.  The patient's history has been reviewed, patient examined, no change in status, stable for surgery.  I have reviewed the patient's chart and labs.  Questions were answered to the patient's satisfaction.     [name]

## 2020-05-02 NOTE — Op Note (Addendum)
[facility] Patient Name: Dean Singh Procedure Date: 05/02/2020 MRN: [id number] Attending MD: [name] , MD Date of Birth: 1960/02/14 CSN: [id number] Age: 60 Admit Type: Outpatient Procedure:                Upper EUS Indications:              CT scan shows mass in tail of pancreas with several                            metastatic appearing liver lesions; mild abd pain,                            + weight loss Providers:                [name], MD, Elmer Ramp. Tilden Dome, RN, Ladona Ridgel, Technician Referring MD:             Penni Homans, MD Medicines:                Monitored Anesthesia Care Complications:            No immediate complications. Estimated blood loss:                            None. Estimated Blood Loss:     Estimated blood loss: none. Procedure:                Pre-Anesthesia Assessment:                           - Prior to the procedure, a History and Physical                            was performed, and patient medications and                            allergies were reviewed. The patient's tolerance of                            previous anesthesia was also reviewed. The risks                            and benefits of the procedure and the sedation                            options and risks were discussed with the patient.                            All questions were answered, and informed consent                            was obtained. Prior Anticoagulants: The patient has  taken Plavix (clopidogrel), last dose was 5 days                            prior to procedure. ASA Grade Assessment: II - A                            patient with mild systemic disease. After reviewing                            the risks and benefits, the patient was deemed in                            satisfactory condition to undergo the procedure.                           After obtaining informed  consent, the endoscope was                            passed under direct vision. Throughout the                            procedure, the patient's blood pressure, pulse, and                            oxygen saturations were monitored continuously. The                            [id number] ([id number]) Olympus Linear EUS was                            introduced through the mouth, and advanced to the                            duodenal bulb. The upper EUS was accomplished                            without difficulty. The patient tolerated the                            procedure well. Scope In: Scope Out: Findings:      ENDOSCOPIC FINDING (limited examination with linear echoendoscope): :      The examined esophagus was endoscopically normal.      The entire examined stomach was endoscopically normal.      The examined duodenum was endoscopically normal.      ENDOSONOGRAPHIC FINDING (limited examination for tissue acquisition): :      1. A round mass was identified in the pancreatic tail. The mass was       hypoechoic and heterogenous. The mass measured at least 33 mm in maximal       cross-sectional diameter. The endosonographic borders were       poorly-defined. Fine needle aspiration for cytology was performed. Color       Doppler imaging was utilized prior to needle puncture to confirm a lack       of significant vascular  structures within the needle path. Two passes       were made with the 22 gauge needle using a transgastric approach,       doppler to avoid significant blood vessels. A cytotechnologist was       present to evaluate the adequacy of the specimen.      2. I was unable to see any of the liver lesions. Impression:               - At least 3.3cm hypoechoic, heterogeneous mass in                            the tail of pancreas with metastatic appearing                            liver lesions on recent CT.                           - Evaluation today was limited as it  was targeted                            for tissue acquisition via EUS guided FNA of the                            mass. Prelimiary cytology review is positive for                            malignancy (adenocarcinoma). Moderate Sedation:      Not Applicable - Patient had care per Anesthesia. Recommendation:           - Discharge patient to home.                           - Await final cytology results. Procedure Code(s):        --- Professional ---                           [phone], Esophagogastroduodenoscopy, flexible,                            transoral; with transendoscopic ultrasound-guided                            intramural or transmural fine needle                            aspiration/biopsy(s), (includes endoscopic                            ultrasound examination limited to the esophagus,                            stomach or duodenum, and adjacent structures) Diagnosis Code(s):        --- Professional ---                           K86.89, Other specified diseases of pancreas  R93.3, Abnormal findings on diagnostic imaging of                            other parts of digestive tract CPT copyright 2019 American Medical Association. All rights reserved. The codes documented in this report are preliminary and upon coder review may  be revised to meet current compliance requirements. [name], MD 05/02/2020 8:08:23 AM This report has been signed electronically. Number of Addenda: 0

## 2020-05-02 NOTE — Anesthesia Postprocedure Evaluation (Signed)
Anesthesia Post Note  Patient: Marvelle L. Rohl  Procedure(s) Performed: UPPER ESOPHAGEAL ENDOSCOPIC ULTRASOUND (EUS) (N/A ) FINE NEEDLE ASPIRATION (FNA) LINEAR (N/A )     Patient location during evaluation: PACU Anesthesia Type: MAC Level of consciousness: awake and alert Pain management: pain level controlled Vital Signs Assessment: post-procedure vital signs reviewed and stable Respiratory status: spontaneous breathing, nonlabored ventilation, respiratory function stable and patient connected to nasal cannula oxygen Cardiovascular status: stable and blood pressure returned to baseline Postop Assessment: no apparent nausea or vomiting Anesthetic complications: no   No complications documented.  Last Vitals:  Vitals:   05/02/20 0820 05/02/20 0830  BP: 127/76 (!) 137/105  Pulse: 79 73  Resp: 16 18  Temp: 36.6 C   SpO2: 100% 97%    Last Pain:  Vitals:   05/02/20 0830  TempSrc:   PainSc: 0-No pain                 Effie Berkshire

## 2020-05-02 NOTE — Discharge Instructions (Signed)
Upper Endoscopy, Adult, Care After This sheet gives you information about how to care for yourself after your procedure. Your health care provider may also give you more specific instructions. If you have problems or questions, contact your health care provider. What can I expect after the procedure? After the procedure, it is common to have:  A sore throat.  Mild stomach pain or discomfort.  Bloating.  Nausea. Follow these instructions at home:  Follow instructions from your health care provider about what to eat or drink after your procedure.  Return to your normal activities as told by your health care provider. Ask your health care provider what activities are safe for you.  Take over-the-counter and prescription medicines only as told by your health care provider.  If you were given a sedative during the procedure, it can affect you for several hours. Do not drive or operate machinery until your health care provider says that it is safe.  Keep all follow-up visits as told by your health care provider. This is important.   Contact a health care provider if you have:  A sore throat that lasts longer than one day.  Trouble swallowing. Get help right away if:  You vomit blood or your vomit looks like coffee grounds.  You have: ? A fever. ? Bloody, black, or tarry stools. ? A severe sore throat or you cannot swallow. ? Difficulty breathing. ? Severe pain in your chest or abdomen. Summary  After the procedure, it is common to have a sore throat, mild stomach discomfort, bloating, and nausea.  If you were given a sedative during the procedure, it can affect you for several hours. Do not drive or operate machinery until your health care provider says that it is safe.  Follow instructions from your health care provider about what to eat or drink after your procedure.  Return to your normal activities as told by your health care provider. This information is not intended to  replace advice given to you by your health care provider. Make sure you discuss any questions you have with your health care provider. Document Revised: 12/20/2018 Document Reviewed: 05/24/2017 Elsevier Patient Education  2021 Elsevier Inc.   Monitored Anesthesia Care, Care After This sheet gives you information about how to care for yourself after your procedure. Your health care provider may also give you more specific instructions. If you have problems or questions, contact your health care provider. What can I expect after the procedure? After the procedure, it is common to have:  Tiredness.  Forgetfulness about what happened after the procedure.  Impaired judgment for important decisions.  Nausea or vomiting.  Some difficulty with balance. Follow these instructions at home: For the time period you were told by your health care provider:  Rest as needed.  Do not participate in activities where you could fall or become injured.  Do not drive or use machinery.  Do not drink alcohol.  Do not take sleeping pills or medicines that cause drowsiness.  Do not make important decisions or sign legal documents.  Do not take care of children on your own.      Eating and drinking  Follow the diet that is recommended by your health care provider.  Drink enough fluid to keep your urine pale yellow.  If you vomit: ? Drink water, juice, or soup when you can drink without vomiting. ? Make sure you have little or no nausea before eating solid foods. General instructions  Have a responsible adult   you for the time you are told. It is important to have someone help care for you until you are awake and alert.  Take over-the-counter and prescription medicines only as told by your health care provider.  If you have sleep apnea, surgery and certain medicines can increase your risk for breathing problems. Follow instructions from your health care provider about wearing your sleep  device: ? Anytime you are sleeping, including during daytime naps. ? While taking prescription pain medicines, sleeping medicines, or medicines that make you drowsy.  Avoid smoking.  Keep all follow-up visits as told by your health care provider. This is important. Contact a health care provider if:  You keep feeling nauseous or you keep vomiting.  You feel light-headed.  You are still sleepy or having trouble with balance after 24 hours.  You develop a rash.  You have a fever.  You have redness or swelling around the IV site. Get help right away if:  You have trouble breathing.  You have new-onset confusion at home. Summary  For several hours after your procedure, you may feel tired. You may also be forgetful and have poor judgment.  Have a responsible adult stay with you for the time you are told. It is important to have someone help care for you until you are awake and alert.  Rest as told. Do not drive or operate machinery. Do not drink alcohol or take sleeping pills.  Get help right away if you have trouble breathing, or if you suddenly become confused. This information is not intended to replace advice given to you by your health care provider. Make sure you discuss any questions you have with your health care provider. Document Revised: 09/07/2019 Document Reviewed: 11/24/2018 Elsevier Patient Education  2021 [employer].

## 2020-05-02 NOTE — Transfer of Care (Signed)
Immediate Anesthesia Transfer of Care Note  Patient: Dean Singh  Procedure(s) Performed: UPPER ESOPHAGEAL ENDOSCOPIC ULTRASOUND (EUS) (N/A ) FINE NEEDLE ASPIRATION (FNA) LINEAR (N/A )  Patient Location: PACU  Anesthesia Type:MAC  Level of Consciousness: sedated, patient cooperative and responds to stimulation  Airway & Oxygen Therapy: Patient Spontanous Breathing and Patient connected to face mask oxygen  Post-op Assessment: Report given to RN and Post -op Vital signs reviewed and stable  Post vital signs: Reviewed and stable  Last Vitals:  Vitals Value Taken Time  BP    Temp    Pulse 72 05/02/20 0806  Resp 15 05/02/20 0806  SpO2 100 % 05/02/20 0806  Vitals shown include unvalidated device data.  Last Pain:  Vitals:   05/02/20 0655  TempSrc: Oral  PainSc: 0-No pain         Complications: No complications documented.

## 2020-05-02 NOTE — Progress Notes (Signed)
Oncology Nurse Navigator Documentation  Oncology Nurse Navigator Flowsheets 05/02/2020  Abnormal Finding Date -  Diagnosis Status -  Navigator Follow Up Date: 05/07/2020  Navigator Follow Up Reason: Pathology  Navigator Location [facility]  Referral Date to RadOnc/MedOnc -  Navigator Encounter Type Other:  Telephone -  Patient Visit Type MedOnc  Treatment Phase Abnormal Scans  Barriers/Navigation Needs Coordination of Care;Education  Education -  Interventions None Required  Acuity Level 2-Minimal Needs (1-2 Barriers Identified)  Coordination of Care -  Education Method -  Support Groups/Services Friends and Family  Time Spent with Patient 15

## 2020-05-02 NOTE — Progress Notes (Signed)
Dr Marin Olp requests that I schedule patient for a follow up appointment next week to discuss treatment options.   Appointment made, and attempted to call patient without success. Message sent via [place]. He later called back to confirm appointment.   Oncology Nurse Navigator Documentation  Oncology Nurse Navigator Flowsheets 05/02/2020  Abnormal Finding Date -  Diagnosis Status -  Navigator Follow Up Date: 05/07/2020  Navigator Follow Up Reason: Pathology  Navigator Location [facility]  Referral Date to RadOnc/MedOnc -  Navigator Encounter Type Appt/Treatment Plan Review;Telephone;MyChart  Telephone -  Patient Visit Type MedOnc  Treatment Phase Abnormal Scans  Barriers/Navigation Needs Coordination of Care;Education  Education Other  Interventions Coordination of Care;Psycho-Social Support  Acuity Level 2-Minimal Needs (1-2 Barriers Identified)  Coordination of Care Appts  Education Method Written  Support Groups/Services Friends and Family  Time Spent with Patient 30

## 2020-05-03 ENCOUNTER — Telehealth: Payer: Self-pay | Admitting: *Deleted

## 2020-05-03 ENCOUNTER — Encounter (HOSPITAL_COMMUNITY): Payer: Self-pay | Admitting: Gastroenterology

## 2020-05-03 LAB — CYTOLOGY - NON PAP

## 2020-05-03 NOTE — Telephone Encounter (Addendum)
-----   Message from Volanda Napoleon, MD sent at 05/03/2020  3:19 PM EDT --- Called patient to tell him that the CT of the chest looks okay.  He has calcified nodules which we typically see with past and old infections.  I would not see anything that would suggest obvious cancer.

## 2020-05-05 ENCOUNTER — Other Ambulatory Visit: Payer: Self-pay | Admitting: Vascular Surgery

## 2020-05-07 ENCOUNTER — Encounter: Payer: Self-pay | Admitting: *Deleted

## 2020-05-07 ENCOUNTER — Encounter: Payer: Self-pay | Admitting: Family Medicine

## 2020-05-07 ENCOUNTER — Other Ambulatory Visit: Payer: Self-pay | Admitting: Family Medicine

## 2020-05-07 DIAGNOSIS — K8689 Other specified diseases of pancreas: Secondary | ICD-10-CM

## 2020-05-07 MED ORDER — HYDROCODONE-ACETAMINOPHEN 5-325 MG PO TABS: 1.0000 | ORAL_TABLET | Freq: Four times a day (QID) | ORAL | 0 refills | Status: DC | PRN

## 2020-05-07 NOTE — Progress Notes (Signed)
Per Dr Antonieta Pert request oncotypeMAP testing request sent on specimen [id number] [facility] 05/02/2020.  Oncology Nurse Navigator Documentation  Oncology Nurse Navigator Flowsheets 05/07/2020  Abnormal Finding Date -  Confirmed Diagnosis Date 05/02/2020  Diagnosis Status Confirmed Diagnosis Complete  Navigator Follow Up Date: 05/08/2020  Navigator Follow Up Reason: Follow-up Appointment  Navigator Location [facility]  Referral Date to RadOnc/MedOnc -  Navigator Encounter Type Molecular Studies;Pathology Review  Telephone -  Patient Visit Type MedOnc  Treatment Phase Pre-Tx/Tx Discussion  Barriers/Navigation Needs Coordination of Care;Education  Education -  Interventions Coordination of Care;Other  Acuity Level 2-Minimal Needs (1-2 Barriers Identified)  Coordination of Care Other  Education Method -  Support Groups/Services Friends and Family  Time Spent with Patient 57

## 2020-05-08 ENCOUNTER — Inpatient Hospital Stay: Payer: Medicare Other | Attending: Hematology & Oncology | Admitting: Hematology & Oncology

## 2020-05-08 ENCOUNTER — Encounter: Payer: Self-pay | Admitting: *Deleted

## 2020-05-08 ENCOUNTER — Other Ambulatory Visit: Payer: Self-pay | Admitting: *Deleted

## 2020-05-08 ENCOUNTER — Encounter: Payer: Self-pay | Admitting: Hematology & Oncology

## 2020-05-08 ENCOUNTER — Inpatient Hospital Stay: Payer: Medicare Other

## 2020-05-08 ENCOUNTER — Telehealth: Payer: Self-pay | Admitting: *Deleted

## 2020-05-08 ENCOUNTER — Telehealth: Payer: Self-pay

## 2020-05-08 ENCOUNTER — Other Ambulatory Visit: Payer: Self-pay

## 2020-05-08 VITALS — BP 119/69 | HR 84 | Temp 98.0°F | Resp 16 | Wt 141.0 lb

## 2020-05-08 DIAGNOSIS — C251 Malignant neoplasm of body of pancreas: Secondary | ICD-10-CM

## 2020-05-08 DIAGNOSIS — R630 Anorexia: Secondary | ICD-10-CM | POA: Insufficient documentation

## 2020-05-08 DIAGNOSIS — Z5111 Encounter for antineoplastic chemotherapy: Secondary | ICD-10-CM | POA: Insufficient documentation

## 2020-05-08 DIAGNOSIS — E119 Type 2 diabetes mellitus without complications: Secondary | ICD-10-CM

## 2020-05-08 DIAGNOSIS — C259 Malignant neoplasm of pancreas, unspecified: Secondary | ICD-10-CM | POA: Insufficient documentation

## 2020-05-08 DIAGNOSIS — Z452 Encounter for adjustment and management of vascular access device: Secondary | ICD-10-CM | POA: Diagnosis not present

## 2020-05-08 DIAGNOSIS — R197 Diarrhea, unspecified: Secondary | ICD-10-CM | POA: Diagnosis not present

## 2020-05-08 DIAGNOSIS — C787 Secondary malignant neoplasm of liver and intrahepatic bile duct: Secondary | ICD-10-CM | POA: Diagnosis not present

## 2020-05-08 LAB — CMP (CANCER CENTER ONLY)
ALT: 70 U/L — ABNORMAL HIGH (ref 0–44)
AST: 39 U/L (ref 15–41)
Albumin: 4.4 g/dL (ref 3.5–5.0)
Alkaline Phosphatase: 405 U/L — ABNORMAL HIGH (ref 38–126)
Anion gap: 8 (ref 5–15)
BUN: 21 mg/dL — ABNORMAL HIGH (ref 6–20)
CO2: 29 mmol/L (ref 22–32)
Calcium: 10.2 mg/dL (ref 8.9–10.3)
Chloride: 96 mmol/L — ABNORMAL LOW (ref 98–111)
Creatinine: 1.06 mg/dL (ref 0.61–1.24)
GFR, Estimated: >60 mL/min (ref >60)
Glucose, Bld: 400 mg/dL — ABNORMAL HIGH (ref 70–99)
Potassium: 4.6 mmol/L (ref 3.5–5.1)
Sodium: 133 mmol/L — ABNORMAL LOW (ref 135–145)
Total Bilirubin: 1 mg/dL (ref 0.3–1.2)
Total Protein: 7.5 g/dL (ref 6.5–8.1)

## 2020-05-08 LAB — CBC WITH DIFFERENTIAL (CANCER CENTER ONLY)
Abs Immature Granulocytes: 0.04 10*3/uL (ref 0.00–0.07)
Basophils Absolute: 0.1 10*3/uL (ref 0.0–0.1)
Basophils Relative: 1 %
Eosinophils Absolute: 0.4 10*3/uL (ref 0.0–0.5)
Eosinophils Relative: 4 %
HCT: 48.4 % (ref 39.0–52.0)
Hemoglobin: 16.4 g/dL (ref 13.0–17.0)
Immature Granulocytes: 1 %
Lymphocytes Relative: 33 %
Lymphs Abs: 2.8 10*3/uL (ref 0.7–4.0)
MCH: 30 pg (ref 26.0–34.0)
MCHC: 33.9 g/dL (ref 30.0–36.0)
MCV: 88.5 fL (ref 80.0–100.0)
Monocytes Absolute: 0.7 10*3/uL (ref 0.1–1.0)
Monocytes Relative: 8 %
Neutro Abs: 4.5 10*3/uL (ref 1.7–7.7)
Neutrophils Relative %: 53 %
Platelet Count: 185 10*3/uL (ref 150–400)
RBC: 5.47 MIL/uL (ref 4.22–5.81)
RDW: 12.7 % (ref 11.5–15.5)
WBC Count: 8.5 10*3/uL (ref 4.0–10.5)
nRBC: 0 % (ref 0.0–0.2)

## 2020-05-08 MED ORDER — DRONABINOL 5 MG PO CAPS: 5.0000 mg | ORAL_CAPSULE | Freq: Two times a day (BID) | ORAL | 0 refills | Status: DC

## 2020-05-08 MED ORDER — FENTANYL 50 MCG/HR TD PT72: 1.0000 | MEDICATED_PATCH | TRANSDERMAL | 0 refills | Status: DC

## 2020-05-08 MED ORDER — INSULIN ASPART 100 UNIT/ML IJ SOLN
20.0000 [IU] | Freq: Once | INTRAMUSCULAR | Status: AC
  Administered 2020-05-08: 20 [IU] via SUBCUTANEOUS
  Filled 2020-05-08: qty 0.2

## 2020-05-08 NOTE — Telephone Encounter (Signed)
appts made and printed for pt per 05/08/20 los   Dean Singh 

## 2020-05-08 NOTE — Progress Notes (Signed)
START ON PATHWAY REGIMEN - Pancreatic Adenocarcinoma     A cycle is every 14 days:     Oxaliplatin      Leucovorin      Irinotecan      Fluorouracil   **Always confirm dose/schedule in your pharmacy ordering system**  Patient Characteristics: Metastatic Disease, First Line, PS = 0,1, BRCA1/2 and PALB2  Mutation Absent/Unknown Therapeutic Status: Metastatic Disease Line of Therapy: First Line ECOG Performance Status: 1 BRCA1/2 Mutation Status: Awaiting Test Results PALB2 Mutation Status: Awaiting Test Results Intent of Therapy: Non-Curative / Palliative Intent, Discussed with Patient

## 2020-05-08 NOTE — Telephone Encounter (Signed)
Call received from [facility] to notify Dr. Marin Olp that tissue is not sufficient for testing for Oncotype.  Dr. Marin Olp notified and order received for pt to have liquid biopsy, [place] at next visit.

## 2020-05-08 NOTE — Progress Notes (Signed)
Patient is to start treatment once port placed and authorization obtained. Spoke to patient and his wife about timeline and needed steps prior to starting treatment. They live about 40 minutes away and would prefer not to come into the office for education.   Education scheduled 05/14/2020 and educator has agreed to do this over the phone.   Port scheduled for 05/15/2020. Per Dr Marin Olp there is no need to hold thinners. Patient and his wife aware of port appointment including date, time and location. They know that patient needs a driver and is NPO after 8am. They are to arrive at 1pm.   Oncology Nurse Navigator Documentation  Oncology Nurse Navigator Flowsheets 05/08/2020  Abnormal Finding Date -  Confirmed Diagnosis Date -  Diagnosis Status -  Planned Course of Treatment Chemotherapy  Phase of Treatment Chemo  Navigator Follow Up Date: 05/20/2020  Navigator Follow Up Reason: Chemotherapy  Navigator Location [facility]  Referral Date to RadOnc/MedOnc -  Navigator Encounter Type Follow-up Appt;Telephone  Telephone Outgoing Call;Appt Confirmation/Clarification  Patient Visit Type MedOnc  Treatment Phase Pre-Tx/Tx Discussion  Barriers/Navigation Needs Coordination of Care;Education  Education Other  Interventions Coordination of Care;Education;Psycho-Social Support  Acuity Level 2-Minimal Needs (1-2 Barriers Identified)  Coordination of Care Appts;Radiology  Education Method Verbal  Support Groups/Services Friends and Family  Time Spent with Patient 74

## 2020-05-08 NOTE — Progress Notes (Signed)
Hematology and Oncology Follow Up Visit  Dean Singh [id number] 1960/04/21 60 y.o. 05/08/2020   Principle Diagnosis:   Metastatic adenocarcinoma the pancreas-hepatic metastasis  Current Therapy:    FOLFIRINOX-start cycle 1 on 05/20/2020     Interim History:  Dean Singh is back for follow-up.  This is a second office visit.  We now have a diagnosis.  Dean Singh underwent EUS by Dr. Ardis Hughs.  This was done on 05/02/2020.  The pathology report ([id number]) showed metastatic adenocarcinoma.  Unfortunately, there was not enough cells that we have sent off for molecular markers.  We did do a CT scan of Dean Singh chest.  This was done on 04/29/2020.  There were numerous calcified pulmonary nodules.  There is no metastatic adenopathy in the mediastinum or hilum.  I think the most critical test was the fact that Dean Singh CA 19-9 was 87,700.  Dean Singh has been having more problems with pain.  I really think Dean Singh is going to need some long-acting pain medication.  I will try him on a Duragesic patch.  I will try him on a 50 mcg patch.  I will also try him on some Marinol to try to help with Dean Singh appetite.  I really think that Dean Singh is going to need systemic chemotherapy.  I think that Dean Singh is in decent enough shape so that Dean Singh could tolerate systemic chemotherapy.  I think we can be aggressive and use FOLFIRINOX.  I do not see any reason why we cannot use FOLFIRINOX.  Dean Singh will need to have a Port-A-Cath placed.  I talked to Dean Singh and Dean Singh wife about this.  I does want Dean Singh quality of life to be better.  Hopefully, if we get Dean Singh pain under better control, we will be able to improve Dean Singh quality of life.  Dean Singh is not having any bleeding.  Dean Singh is not having any diarrhea.  Dean Singh is having no cough.  There is no chest pain.  Currently, Dean Singh performance status is ECOG 1.  Medications:  Current Outpatient Medications:  .  aspirin EC 81 MG tablet, Take 1 tablet (81 mg total) by mouth daily., Disp:  , Rfl:  .  cilostazol (PLETAL) 100 MG tablet, TAKE  1 TABLET BY MOUTH TWICE A DAY, Disp: 180 tablet, Rfl: 1 .  clopidogrel (PLAVIX) 75 MG tablet, TAKE 1 TABLET BY MOUTH EVERY DAY (Patient taking differently: Take 75 mg by mouth daily.), Disp: 90 tablet, Rfl: 3 .  Continuous Blood Gluc Receiver (FREESTYLE LIBRE 14 DAY READER) DEVI, Use Freestyle Libre meter to monitor blood sugars. DX:E11.9, Disp: 1 each, Rfl: 0 .  Continuous Blood Gluc Sensor (FREESTYLE LIBRE 14 DAY SENSOR) MISC, Apply one sensor once every 14 days to monitor blood sugars. DX:E11.9, Disp: 2 each, Rfl: 2 .  dapagliflozin propanediol (FARXIGA) 5 MG TABS tablet, Take 1 tablet (5 mg total) by mouth daily before breakfast., Disp: 30 tablet, Rfl: 6 .  ezetimibe (ZETIA) 10 MG tablet, TAKE 1 TABLET BY MOUTH EVERY DAY (Patient taking differently: Take 10 mg by mouth daily.), Disp: 90 tablet, Rfl: 3 .  famotidine (PEPCID) 40 MG tablet, TAKE 1 TABLET BY MOUTH EVERY DAY (Patient not taking: Reported on 04/23/2020), Disp: 90 tablet, Rfl: 1 .  glucose blood (ONETOUCH ULTRA) test strip, USE TWICE DAILY AS DIRECTED ( E11.65), Disp: 200 each, Rfl: 12 .  HYDROcodone-acetaminophen (NORCO) 5-325 MG tablet, Take 1-2 tablets by mouth every 6 (six) hours as needed for moderate pain or severe pain., Disp: 60 tablet, Rfl: 0 .  Insulin Glargine (BASAGLAR KWIKPEN) 100 UNIT/ML, Inject 20 Units into the skin daily., Disp: 30 mL, Rfl: 4 .  Insulin Pen Needle 32G X 4 MM MISC, 1 Device by Does not apply route daily., Disp: 50 each, Rfl: 6 .  JANUVIA 100 MG tablet, TAKE 1 TABLET BY MOUTH EVERY DAY, Disp: 90 tablet, Rfl: 1 .  metFORMIN (GLUCOPHAGE) 1000 MG tablet, Take 1 tablet (1,000 mg total) by mouth daily with breakfast., Disp: 90 tablet, Rfl: 3 .  nitroGLYCERIN (NITROSTAT) 0.4 MG SL tablet, Place 0.4 mg under the tongue every 5 (five) minutes as needed for chest pain., Disp: , Rfl:  .  ondansetron (ZOFRAN) 4 MG tablet, Take 1 tablet (4 mg total) by mouth every 8 (eight) hours as needed for nausea or vomiting., Disp:  40 tablet, Rfl: 0 .  pantoprazole (PROTONIX) 40 MG tablet, Take 1 tablet (40 mg total) by mouth daily., Disp: 90 tablet, Rfl: 3 .  pregabalin (LYRICA) 300 MG capsule, Take 1 capsule (300 mg total) by mouth at bedtime., Disp: 30 capsule, Rfl: 6 .  rosuvastatin (CRESTOR) 5 MG tablet, TAKE 1 TABLET BY MOUTH EVERY DAY (Patient taking differently: Take 5 mg by mouth daily.), Disp: 90 tablet, Rfl: 0 .  Saw Palmetto, Serenoa repens, (SAW PALMETTO PO), Take 1 capsule by mouth daily as needed (When having prostate problems). , Disp: , Rfl:  .  Wheat Dextrin (BENEFIBER PO), Take 1 Scoop by mouth daily., Disp: , Rfl:   Current Facility-Administered Medications:  .  insulin aspart (novoLOG) injection 20 Units, 20 Units, Subcutaneous, Once, Roxas Clymer, Rudell Cobb, MD  Allergies:  Allergies  Allergen Reactions  . Statins Itching    Atorvastatin cause "my skin to feel horrible and unbearable".  . Morphine Nausea And Vomiting    "felt like chest was exploding" pt reports     Past Medical History, Surgical history, Social history, and Family History were reviewed and updated.  Review of Systems: Review of Systems  Constitutional: Positive for fatigue.  HENT:  Negative.   Eyes: Negative.   Respiratory: Negative.   Cardiovascular: Negative.   Gastrointestinal: Positive for abdominal pain.  Endocrine: Negative.   Musculoskeletal: Positive for arthralgias, flank pain and myalgias.  Skin: Negative.   Neurological: Negative.   Hematological: Negative.   Psychiatric/Behavioral: Negative.     Physical Exam:  weight is 141 lb (64 kg). Dean Singh oral temperature is 98 F (36.7 C). Dean Singh blood pressure is 119/69 and Dean Singh pulse is 84. Dean Singh respiration is 16 and oxygen saturation is 97%.   Wt Readings from Last 3 Encounters:  05/08/20 141 lb (64 kg)  05/02/20 145 lb 1 oz (65.8 kg)  04/24/20 143 lb (64.9 kg)    Physical Exam Vitals reviewed.  HENT:     Head: Normocephalic and atraumatic.  Eyes:     Pupils:  Pupils are equal, round, and reactive to light.  Cardiovascular:     Rate and Rhythm: Normal rate and regular rhythm.     Heart sounds: Normal heart sounds.  Pulmonary:     Effort: Pulmonary effort is normal.     Breath sounds: Normal breath sounds.  Abdominal:     General: Bowel sounds are normal.     Palpations: Abdomen is soft.  Musculoskeletal:        General: No tenderness or deformity. Normal range of motion.     Cervical back: Normal range of motion.  Lymphadenopathy:     Cervical: No cervical adenopathy.  Skin:  General: Skin is warm and dry.     Findings: No erythema or rash.  Neurological:     Mental Status: Dean Singh is alert and oriented to person, place, and time.  Psychiatric:        Behavior: Behavior normal.        Thought Content: Thought content normal.        Judgment: Judgment normal.      Lab Results  Component Value Date   WBC 8.5 05/08/2020   HGB 16.4 05/08/2020   HCT 48.4 05/08/2020   MCV 88.5 05/08/2020   PLT 185 05/08/2020     Chemistry      Component Value Date/Time   NA 133 (L) 05/08/2020 1212   NA 132 (L) 05/01/2019 1108   K 4.6 05/08/2020 1212   CL 96 (L) 05/08/2020 1212   CO2 29 05/08/2020 1212   BUN 21 (H) 05/08/2020 1212   BUN 18 05/01/2019 1108   CREATININE 1.06 05/08/2020 1212      Component Value Date/Time   CALCIUM 10.2 05/08/2020 1212   ALKPHOS 405 (H) 05/08/2020 1212   AST 39 05/08/2020 1212   ALT 70 (H) 05/08/2020 1212   BILITOT 1.0 05/08/2020 1212      Impression and Plan: Dean Singh. Wirt is a very nice 60 year old white male.  Dean Singh has metastatic adenocarcinoma the pancreas.  Dean Singh has extensive liver metastasis.  I must say that this CA 19-9 elevation is quite significant.  I think this goes with the fact that Dean Singh does have fairly extensive metastatic disease.  We talked about prognosis.  I told him if Dean Singh did not want treatment or treatment do not work, I doubt that Dean Singh would make it through June.  Dean Singh just has extensive  disease.  I told him that if treatment did work, that we would be able to hopefully get more than a year out of this.  Dean Singh we will try treatment.  Dean Singh understands the difficulties of treatment.  I went over this with him.  We will try to get the Port-A-Cath placed next week.  I will try to get him started on treatment on May 16.  I think the CA 19-9 will clearly tell us how things are going for him.  I would do a follow-up CT scan after the fourth cycle of treatment.  I just feel bad for Dean Singh. [place].  Dean Singh is so nice.  Dean Singh wife is very nice.  They both have a very strong faith.  I will plan to see him back when Dean Singh has Dean Singh second cycle of treatment in late May.   Volanda Napoleon, MD 5/4/20221:58 PM

## 2020-05-09 ENCOUNTER — Other Ambulatory Visit: Payer: Self-pay | Admitting: *Deleted

## 2020-05-09 MED ORDER — FENTANYL 50 MCG/HR TD PT72: 1.0000 | MEDICATED_PATCH | TRANSDERMAL | 0 refills | Status: DC

## 2020-05-13 NOTE — Progress Notes (Signed)
Pharmacist Chemotherapy Monitoring - Initial Assessment    Anticipated start date: 05/20/20  Regimen:  . Are orders appropriate based on the patient's diagnosis, regimen, and cycle? Yes . Does the plan date match the patient's scheduled date? Yes . Is the sequencing of drugs appropriate? Yes . Are the premedications appropriate for the patient's regimen? Yes . Prior Authorization for treatment is: Approved o If applicable, is the correct biosimilar selected based on the patient's insurance? not applicable  Organ Function and Labs: [name] Are dose adjustments needed based on the patient's renal function, hepatic function, or hematologic function? No . Are appropriate labs ordered prior to the start of patient's treatment? Yes . Other organ system assessment, if indicated: N/A . The following baseline labs, if indicated, have been ordered: N/A  Dose Assessment: . Are the drug doses appropriate? Yes . Are the following correct: o Drug concentrations Yes o IV fluid compatible with drug Yes o Administration routes Yes o Timing of therapy Yes . If applicable, does the patient have documented access for treatment and/or plans for port-a-cath placement? yes . If applicable, have lifetime cumulative doses been properly documented and assessed? not applicable Lifetime Dose Tracking  No doses have been documented on this patient for the following tracked chemicals: Doxorubicin, Epirubicin, Idarubicin, Daunorubicin, Mitoxantrone, Bleomycin, Oxaliplatin, Carboplatin, Liposomal Doxorubicin  o   Toxicity Monitoring/Prevention: . The patient has the following take home antiemetics prescribed: Ondansetron, Prochlorperazine, Dexamethasone and Lorazepam . The patient has the following take home medications prescribed: N/A . Medication allergies and previous infusion related reactions, if applicable, have been reviewed and addressed. No/Yes  . The patient's current medication list has been assessed for  drug-drug interactions with their chemotherapy regimen. no significant drug-drug interactions were identified on review.  Order Review: . Are the treatment plan orders signed? Yes . Is the patient scheduled to see a provider prior to their treatment? No  I verify that I have reviewed each item in the above checklist and answered each question accordingly.  Claybon Jabs, [place], 05/13/2020  7:59 AM

## 2020-05-14 ENCOUNTER — Encounter: Payer: Self-pay | Admitting: *Deleted

## 2020-05-14 ENCOUNTER — Telehealth: Payer: Self-pay | Admitting: *Deleted

## 2020-05-14 ENCOUNTER — Other Ambulatory Visit: Payer: Self-pay | Admitting: Radiology

## 2020-05-14 ENCOUNTER — Inpatient Hospital Stay: Payer: Medicare Other

## 2020-05-14 ENCOUNTER — Other Ambulatory Visit: Payer: Self-pay | Admitting: *Deleted

## 2020-05-14 DIAGNOSIS — C259 Malignant neoplasm of pancreas, unspecified: Secondary | ICD-10-CM

## 2020-05-14 DIAGNOSIS — Z95828 Presence of other vascular implants and grafts: Secondary | ICD-10-CM | POA: Insufficient documentation

## 2020-05-14 MED ORDER — PROCHLORPERAZINE MALEATE 10 MG PO TABS: 10.0000 mg | ORAL_TABLET | Freq: Four times a day (QID) | ORAL | 1 refills | Status: DC | PRN

## 2020-05-14 MED ORDER — LOPERAMIDE HCL 2 MG PO TABS: ORAL_TABLET | ORAL | 1 refills | Status: DC

## 2020-05-14 MED ORDER — LIDOCAINE-PRILOCAINE 2.5-2.5 % EX CREA: TOPICAL_CREAM | CUTANEOUS | 3 refills | Status: DC

## 2020-05-14 MED ORDER — ONDANSETRON HCL 8 MG PO TABS: 8.0000 mg | ORAL_TABLET | Freq: Two times a day (BID) | ORAL | 1 refills | Status: DC | PRN

## 2020-05-14 NOTE — Telephone Encounter (Signed)
Faxed received from [facility] for [place]) authorized 05/14/20-08/14/20 Auth# [id number]

## 2020-05-14 NOTE — Progress Notes (Signed)
Patient in chemotherapy education class with  Wife.  Patient education done over phone since patient lives 45 min away.  Discussed side effects of Oxaliplatin, Leucovorin, Irinotecan, 5FU which include but are not limited to myelosuppression, decreased appetite, fatigue, fever, allergic or infusional reaction, mucositis, cardiac toxicity, cough, SOB, altered taste, nausea and vomiting, diarrhea, constipation, elevated LFTs myalgia and arthralgias, hair loss or thinning, rash, skin dryness, nail changes, peripheral neuropathy, discolored urine, delayed wound healing, mental changes (Chemo brain), increased risk of infections, weight loss. Oxaliplatin cold sensitivity reviewed as well Reviewed infusion room and office policy and procedure and phone numbers 24 hours x 7 days a week.  Reviewed ambulatory pump specifics and how to manage safe handling at home.  Reviewed when to call the office with any concerns or problems.  Scientist, clinical (histocompatibility and immunogenetics) given.  Discussed portacath insertion and EMLA cream administration.  Antiemetic protocol and chemotherapy schedule reviewed. Patient verbalized understanding of chemotherapy indications and possible side effects.  Teachback done

## 2020-05-15 ENCOUNTER — Ambulatory Visit (HOSPITAL_COMMUNITY)
Admission: RE | Admit: 2020-05-15 | Discharge: 2020-05-15 | Disposition: A | Discharge status: Home / Self Care | Payer: Medicare Other | Source: Ambulatory Visit | Attending: Hematology & Oncology | Admitting: Hematology & Oncology

## 2020-05-15 ENCOUNTER — Encounter (HOSPITAL_COMMUNITY): Payer: Self-pay

## 2020-05-15 ENCOUNTER — Other Ambulatory Visit: Payer: Self-pay

## 2020-05-15 ENCOUNTER — Other Ambulatory Visit: Payer: Self-pay | Admitting: Hematology & Oncology

## 2020-05-15 DIAGNOSIS — F17210 Nicotine dependence, cigarettes, uncomplicated: Secondary | ICD-10-CM | POA: Diagnosis not present

## 2020-05-15 DIAGNOSIS — Z79899 Other long term (current) drug therapy: Secondary | ICD-10-CM | POA: Diagnosis not present

## 2020-05-15 DIAGNOSIS — Z7984 Long term (current) use of oral hypoglycemic drugs: Secondary | ICD-10-CM | POA: Insufficient documentation

## 2020-05-15 DIAGNOSIS — Z794 Long term (current) use of insulin: Secondary | ICD-10-CM | POA: Diagnosis not present

## 2020-05-15 DIAGNOSIS — E785 Hyperlipidemia, unspecified: Secondary | ICD-10-CM | POA: Insufficient documentation

## 2020-05-15 DIAGNOSIS — Z7902 Long term (current) use of antithrombotics/antiplatelets: Secondary | ICD-10-CM | POA: Insufficient documentation

## 2020-05-15 DIAGNOSIS — I2510 Atherosclerotic heart disease of native coronary artery without angina pectoris: Secondary | ICD-10-CM | POA: Diagnosis not present

## 2020-05-15 DIAGNOSIS — E119 Type 2 diabetes mellitus without complications: Secondary | ICD-10-CM | POA: Insufficient documentation

## 2020-05-15 DIAGNOSIS — I10 Essential (primary) hypertension: Secondary | ICD-10-CM | POA: Diagnosis not present

## 2020-05-15 DIAGNOSIS — C259 Malignant neoplasm of pancreas, unspecified: Secondary | ICD-10-CM | POA: Diagnosis present

## 2020-05-15 DIAGNOSIS — C787 Secondary malignant neoplasm of liver and intrahepatic bile duct: Secondary | ICD-10-CM

## 2020-05-15 DIAGNOSIS — Z7982 Long term (current) use of aspirin: Secondary | ICD-10-CM | POA: Insufficient documentation

## 2020-05-15 DIAGNOSIS — K219 Gastro-esophageal reflux disease without esophagitis: Secondary | ICD-10-CM | POA: Insufficient documentation

## 2020-05-15 HISTORY — PX: IR IMAGING GUIDED PORT INSERTION: IMG5740

## 2020-05-15 LAB — CBC WITH DIFFERENTIAL/PLATELET
Abs Immature Granulocytes: 0.07 10*3/uL (ref 0.00–0.07)
Basophils Absolute: 0.1 10*3/uL (ref 0.0–0.1)
Basophils Relative: 1 %
Eosinophils Absolute: 0.4 10*3/uL (ref 0.0–0.5)
Eosinophils Relative: 4 %
HCT: 48.4 % (ref 39.0–52.0)
Hemoglobin: 16.8 g/dL (ref 13.0–17.0)
Immature Granulocytes: 1 %
Lymphocytes Relative: 20 %
Lymphs Abs: 2.2 10*3/uL (ref 0.7–4.0)
MCH: 30.9 pg (ref 26.0–34.0)
MCHC: 34.7 g/dL (ref 30.0–36.0)
MCV: 89.1 fL (ref 80.0–100.0)
Monocytes Absolute: 0.8 10*3/uL (ref 0.1–1.0)
Monocytes Relative: 7 %
Neutro Abs: 7.1 10*3/uL (ref 1.7–7.7)
Neutrophils Relative %: 67 %
Platelets: 199 10*3/uL (ref 150–400)
RBC: 5.43 MIL/uL (ref 4.22–5.81)
RDW: 13.2 % (ref 11.5–15.5)
WBC: 10.6 10*3/uL — ABNORMAL HIGH (ref 4.0–10.5)
nRBC: 0 % (ref 0.0–0.2)

## 2020-05-15 LAB — PROTIME-INR
INR: 1 (ref 0.8–1.2)
Prothrombin Time: 13.4 seconds (ref 11.4–15.2)

## 2020-05-15 LAB — GLUCOSE, CAPILLARY: Glucose-Capillary: 325 mg/dL — ABNORMAL HIGH (ref 70–99)

## 2020-05-15 MED ORDER — HEPARIN SOD (PORK) LOCK FLUSH 100 UNIT/ML IV SOLN
INTRAVENOUS | Status: AC
  Filled 2020-05-15: qty 5

## 2020-05-15 MED ORDER — LIDOCAINE-EPINEPHRINE 1 %-1:100000 IJ SOLN
INTRAMUSCULAR | Status: AC | PRN
  Administered 2020-05-15: 20 mL

## 2020-05-15 MED ORDER — FENTANYL CITRATE (PF) 100 MCG/2ML IJ SOLN
INTRAMUSCULAR | Status: AC | PRN
  Administered 2020-05-15 (×2): 50 ug via INTRAVENOUS

## 2020-05-15 MED ORDER — FENTANYL CITRATE (PF) 100 MCG/2ML IJ SOLN
INTRAMUSCULAR | Status: AC
  Filled 2020-05-15: qty 2

## 2020-05-15 MED ORDER — MIDAZOLAM HCL 2 MG/2ML IJ SOLN
INTRAMUSCULAR | Status: AC
  Filled 2020-05-15: qty 4

## 2020-05-15 MED ORDER — MIDAZOLAM HCL 2 MG/2ML IJ SOLN
INTRAMUSCULAR | Status: AC | PRN
  Administered 2020-05-15 (×2): 1 mg via INTRAVENOUS

## 2020-05-15 MED ORDER — LIDOCAINE-EPINEPHRINE 1 %-1:100000 IJ SOLN
INTRAMUSCULAR | Status: AC
  Filled 2020-05-15: qty 1

## 2020-05-15 MED ORDER — SODIUM CHLORIDE 0.9 % IV SOLN: INTRAVENOUS | Status: DC

## 2020-05-15 NOTE — Discharge Instructions (Signed)
For questions /concerns may call Interventional Radiology at [phone]  You may remove your dressing and shower tomorrow afternoon  DO NOT use EMLA cream for 2 weeks after port placement as the cream will remove surgical glue on your incision.    Implanted Port Insertion, Care After This sheet gives you information about how to care for yourself after your procedure. Your health care provider may also give you more specific instructions. If you have problems or questions, contact your health care provider. What can I expect after the procedure? After the procedure, it is common to have:  Discomfort at the port insertion site.  Bruising on the skin over the port. This should improve over 3-4 days. Follow these instructions at home: Port care  After your port is placed, you will get a manufacturer's information card. The card has information about your port. Keep this card with you at all times.  Take care of the port as told by your health care provider. Ask your health care provider if you or a family member can get training for taking care of the port at home. A home health care nurse may also take care of the port.  Make sure to remember what type of port you have. Incision care  Follow instructions from your health care provider about how to take care of your port insertion site. Make sure you: ? Wash your hands with soap and water before and after you change your bandage (dressing). If soap and water are not available, use hand sanitizer. ? Change your dressing as told by your health care provider. ? Leave stitches (sutures), skin glue, or adhesive strips in place. These skin closures may need to stay in place for 2 weeks or longer. If adhesive strip edges start to loosen and curl up, you may trim the loose edges. Do not remove adhesive strips completely unless your health care provider tells you to do that.  Check your port insertion site every day for signs of infection. Check  for: ? Redness, swelling, or pain. ? Fluid or blood. ? Warmth. ? Pus or a bad smell.      Activity  Return to your normal activities as told by your health care provider. Ask your health care provider what activities are safe for you.  Do not lift anything that is heavier than 10 lb (4.5 kg), or the limit that you are told, until your health care provider says that it is safe. General instructions  Take over-the-counter and prescription medicines only as told by your health care provider.  Do not take baths, swim, or use a hot tub until your health care provider approves. Ask your health care provider if you may take showers. You may only be allowed to take sponge baths.  Do not drive for 24 hours if you were given a sedative during your procedure.  Wear a medical alert bracelet in case of an emergency. This will tell any health care providers that you have a port.  Keep all follow-up visits as told by your health care provider. This is important. Contact a health care provider if:  You cannot flush your port with saline as directed, or you cannot draw blood from the port.  You have a fever or chills.  You have redness, swelling, or pain around your port insertion site.  You have fluid or blood coming from your port insertion site.  Your port insertion site feels warm to the touch.  You have pus or a   bad smell coming from the port insertion site. Get help right away if:  You have chest pain or shortness of breath.  You have bleeding from your port that you cannot control. Summary  Take care of the port as told by your health care provider. Keep the manufacturer's information card with you at all times.  Change your dressing as told by your health care provider.  Contact a health care provider if you have a fever or chills or if you have redness, swelling, or pain around your port insertion site.  Keep all follow-up visits as told by your health care provider. This  information is not intended to replace advice given to you by your health care provider. Make sure you discuss any questions you have with your health care provider.   Moderate Conscious Sedation, Adult, Care After This sheet gives you information about how to care for yourself after your procedure. Your health care provider may also give you more specific instructions. If you have problems or questions, contact your health care provider. What can I expect after the procedure? After the procedure, it is common to have:  Sleepiness for several hours.  Impaired judgment for several hours.  Difficulty with balance.  Vomiting if you eat too soon. Follow these instructions at home: For the time period you were told by your health care provider:  Rest.  Do not participate in activities where you could fall or become injured.  Do not drive or use machinery.  Do not drink alcohol.  Do not take sleeping pills or medicines that cause drowsiness.  Do not make important decisions or sign legal documents.  Do not take care of children on your own.      Eating and drinking  Follow the diet recommended by your health care provider.  Drink enough fluid to keep your urine pale yellow.  If you vomit: ? Drink water, juice, or soup when you can drink without vomiting. ? Make sure you have little or no nausea before eating solid foods.   General instructions  Take over-the-counter and prescription medicines only as told by your health care provider.  Have a responsible adult stay with you for the time you are told. It is important to have someone help care for you until you are awake and alert.  Do not smoke.  Keep all follow-up visits as told by your health care provider. This is important. Contact a health care provider if:  You are still sleepy or having trouble with balance after 24 hours.  You feel light-headed.  You keep feeling nauseous or you keep vomiting.  You develop  a rash.  You have a fever.  You have redness or swelling around the IV site. Get help right away if:  You have trouble breathing.  You have new-onset confusion at home. Summary  After the procedure, it is common to feel sleepy, have impaired judgment, or feel nauseous if you eat too soon.  Rest after you get home. Know the things you should not do after the procedure.  Follow the diet recommended by your health care provider and drink enough fluid to keep your urine pale yellow.  Get help right away if you have trouble breathing or new-onset confusion at home. This information is not intended to replace advice given to you by your health care provider. Make sure you discuss any questions you have with your health care provider. Document Revised: 04/21/2019 Document Reviewed: 11/17/2018 Elsevier Patient Education  2021 Elsevier   Inc.  

## 2020-05-15 NOTE — H&P (Signed)
Chief Complaint: Patient was seen in consultation today for image guided Port-A-Cath placement at the request of Ennever,Peter R  Referring Physician(s): Ennever,Peter R  Supervising Physician: Jacqulynn Cadet  Patient Status: [place]  History of Present Illness: Dean Singh is a 60 y.o. male with PMH of GERD, CAD, HTN, hyperlipidemia, DM, heart attack, stroke, bladder cancer, and pancreatic cancer metastasized to liver. Patient underwent CT abdomen pelvis on 04/15/2020 due to abdominal pain and weight loss which showed findings highly suggestive of primary pancreatic adenocarcinoma.  Patient was referred to oncology and has been followed by Dr. Marin Olp.  Dr. Marin Olp refer patient to GI for upper endoscopy with biopsy.  The endoscopy was performed on 05/02/2020, pathology revealed malignant cells consistent with adenocarcinoma. After thorough discussion and shared decision making with oncology team, patient decided to undergo chemotherapy.  IR was requested for image guided Port-A-Cath placement.  Patient laying in bed, not in acute distress.  Reports chronic abdominal pain due to pancreatic cancer.  Denise headache, fever, chills, shortness of breath, cough, chest pain, nausea ,vomiting, and bleeding.  Past Medical History:  Diagnosis Date  . Arthritis    DDD  . Bladder cancer ([place])   . Bladder cancer ([place]) 08/17/2018  . CAD in native artery 08/17/2018  . Current nicotine use 08/17/2018  . Diabetes mellitus without complication ([place])   . GERD (gastroesophageal reflux disease)   . Goals of care, counseling/discussion 04/24/2020  . H/O: stroke   . Heart attack ([place])    2 stents  . Hx of hemorrhoids 08/17/2018  . Hyperlipidemia   . Hypertension   . Pancreatic cancer metastasized to liver ([place]) 04/24/2020  . Peripheral neuropathy 08/17/2018  . Stroke ([place]) 57  . Visual changes 08/17/2018    Past Surgical History:  Procedure Laterality Date  . ABDOMINAL AORTOGRAM  W/LOWER EXTREMITY Bilateral 02/08/2020   Procedure: ABDOMINAL AORTOGRAM W/LOWER EXTREMITY;  Surgeon: Marty Heck, MD;  Location: [facility];  Service: Cardiovascular;  Laterality: Bilateral;  . BLADDER TUMOR EXCISION     cancer  . CAROTID ENDARTERECTOMY Left   . CORONARY ANGIOPLASTY WITH STENT PLACEMENT     2 stent  . CORONARY STENT INTERVENTION N/A 09/02/2018   Procedure: CORONARY STENT INTERVENTION;  Surgeon: Sherren Mocha, MD;  Location: [facility];  Service: Cardiovascular;  Laterality: N/A;  . ESOPHAGOGASTRODUODENOSCOPY (EGD) WITH PROPOFOL N/A 05/02/2020   Procedure: ESOPHAGOGASTRODUODENOSCOPY (EGD) WITH PROPOFOL;  Surgeon: [name], MD;  Location: [facility];  Service: Endoscopy;  Laterality: N/A;  . FINE NEEDLE ASPIRATION N/A 05/02/2020   Procedure: FINE NEEDLE ASPIRATION (FNA) LINEAR;  Surgeon: [name], MD;  Location: [facility];  Service: Endoscopy;  Laterality: N/A;  . HERNIA REPAIR     right inguinal 2017  . LEFT HEART CATH AND CORONARY ANGIOGRAPHY N/A 09/02/2018   Procedure: LEFT HEART CATH AND CORONARY ANGIOGRAPHY;  Surgeon: Sherren Mocha, MD;  Location: [facility];  Service: Cardiovascular;  Laterality: N/A;  . UPPER ESOPHAGEAL ENDOSCOPIC ULTRASOUND (EUS) N/A 05/02/2020   Procedure: UPPER ESOPHAGEAL ENDOSCOPIC ULTRASOUND (EUS);  Surgeon: [name], MD;  Location: [facility];  Service: Endoscopy;  Laterality: N/A;    Allergies: Statins and Morphine  Medications: Prior to Admission medications   Medication Sig Start Date End Date Taking? Authorizing Provider  aspirin EC 81 MG tablet Take 1 tablet (81 mg total) by mouth daily. 08/16/18   Mosie Lukes, MD  cilostazol (PLETAL) 100 MG tablet TAKE 1  TABLET BY MOUTH TWICE A DAY 05/06/20   Angelia Mould, MD  clopidogrel (PLAVIX) 75 MG tablet TAKE 1 TABLET BY MOUTH EVERY DAY Patient taking differently: Take 75 mg by mouth daily. 09/28/19   Buford Dresser, MD   Continuous Blood Gluc Receiver (FREESTYLE LIBRE 14 DAY READER) DEVI Use Freestyle Libre meter to monitor blood sugars. DX:E11.9 08/31/19   Shamleffer, Melanie Crazier, MD  Continuous Blood Gluc Sensor (FREESTYLE LIBRE 14 DAY SENSOR) MISC Apply one sensor once every 14 days to monitor blood sugars. DX:E11.9 08/31/19   Shamleffer, Melanie Crazier, MD  dapagliflozin propanediol (FARXIGA) 5 MG TABS tablet Take 1 tablet (5 mg total) by mouth daily before breakfast. 12/04/19   Shamleffer, Melanie Crazier, MD  dronabinol (MARINOL) 5 MG capsule Take 1 capsule (5 mg total) by mouth 2 (two) times daily before lunch and supper. 05/08/20   Volanda Napoleon, MD  ezetimibe (ZETIA) 10 MG tablet TAKE 1 TABLET BY MOUTH EVERY DAY Patient taking differently: Take 10 mg by mouth daily. 10/30/19   Buford Dresser, MD  famotidine (PEPCID) 40 MG tablet TAKE 1 TABLET BY MOUTH EVERY DAY Patient not taking: Reported on 04/23/2020 04/03/20   Mosie Lukes, MD  fentaNYL (DURAGESIC) 50 MCG/HR Place 1 patch onto the skin every 3 (three) days. 05/09/20 06/08/20  Volanda Napoleon, MD  glucose blood (ONETOUCH ULTRA) test strip USE TWICE DAILY AS DIRECTED ( E11.65) 08/23/19   Mosie Lukes, MD  HYDROcodone-acetaminophen (NORCO) 5-325 MG tablet Take 1-2 tablets by mouth every 6 (six) hours as needed for moderate pain or severe pain. 05/07/20   Mosie Lukes, MD  Insulin Glargine [facility] KWIKPEN) 100 UNIT/ML Inject 20 Units into the skin daily. 04/03/20   Mosie Lukes, MD  Insulin Pen Needle 32G X 4 MM MISC 1 Device by Does not apply route daily. 08/30/19   Shamleffer, Melanie Crazier, MD  JANUVIA 100 MG tablet TAKE 1 TABLET BY MOUTH EVERY DAY 05/07/20   Mosie Lukes, MD  lidocaine-prilocaine (EMLA) cream Apply to affected area once 05/14/20   Ennever, Rudell Cobb, MD  loperamide (IMODIUM A-D) 2 MG tablet Take 2 at onset of diarrhea, then 1 every 2hrs until 12hr without a BM. May take 2 tab every 4hrs at bedtime. If diarrhea recurs  repeat. 05/14/20   Volanda Napoleon, MD  metFORMIN (GLUCOPHAGE) 1000 MG tablet Take 1 tablet (1,000 mg total) by mouth daily with breakfast. 12/04/19   Shamleffer, Melanie Crazier, MD  nitroGLYCERIN (NITROSTAT) 0.4 MG SL tablet Place 0.4 mg under the tongue every 5 (five) minutes as needed for chest pain.    [provider]  ondansetron (ZOFRAN) 4 MG tablet Take 1 tablet (4 mg total) by mouth every 8 (eight) hours as needed for nausea or vomiting. 04/16/20   Mosie Lukes, MD  ondansetron (ZOFRAN) 8 MG tablet Take 1 tablet (8 mg total) by mouth 2 (two) times daily as needed. Start on day 3 after chemotherapy. 05/14/20   Volanda Napoleon, MD  pantoprazole (PROTONIX) 40 MG tablet Take 1 tablet (40 mg total) by mouth daily. 07/28/19   Buford Dresser, MD  pregabalin (LYRICA) 300 MG capsule Take 1 capsule (300 mg total) by mouth at bedtime. 12/04/19   Shamleffer, Melanie Crazier, MD  prochlorperazine (COMPAZINE) 10 MG tablet Take 1 tablet (10 mg total) by mouth every 6 (six) hours as needed (Nausea or vomiting). 05/14/20   Volanda Napoleon, MD  rosuvastatin (CRESTOR) 5 MG tablet TAKE  1 TABLET BY MOUTH EVERY DAY Patient taking differently: Take 5 mg by mouth daily. 04/01/20   Mosie Lukes, MD  Saw Palmetto, Serenoa repens, (SAW PALMETTO PO) Take 1 capsule by mouth daily as needed (When having prostate problems).     [provider]  Wheat Dextrin (BENEFIBER PO) Take 1 Scoop by mouth daily.    [provider]     Family History  Problem Relation Age of Onset  . COPD Mother   . Diabetes Mother   . Dementia Mother   . Heart disease Mother        chf  . Arthritis Mother   . Osteoporosis Mother        h/o cigarette smoking  . Heart disease Father        stents 3 x 2, pacer, chf  . Parkinson's disease Father   . Hyperlipidemia Father   . Diabetes Father   . Arthritis Father        DDD  . Osteoporosis Father        h/o cigarette use  . Addison's disease Sister    . Diabetes Sister   . Hyperlipidemia Sister   . Hypertension Sister   . Obesity Sister   . Hyperlipidemia Brother   . Hypertension Brother   . Diabetes Brother   . Obesity Brother   . Rheumatic fever Son   . Stickler syndrome Son   . Diabetes Maternal Grandmother   . Dementia Maternal Grandmother   . Heart disease Maternal Grandmother        chf  . Hyperlipidemia Maternal Grandmother   . Diabetes Maternal Grandfather   . Dementia Maternal Grandfather   . Heart disease Maternal Grandfather        cad  . Hyperlipidemia Maternal Grandfather   . Other Paternal Grandmother        scarlet fever  . Prostate cancer Paternal Grandfather   . Obesity Brother     Social History   Socioeconomic History  . Marital status: Married    Spouse name: Not on file  . Number of children: Not on file  . Years of education: Not on file  . Highest education level: Not on file  Occupational History  . Not on file  Tobacco Use  . Smoking status: Current Every Day Smoker    Packs/day: 1.00    Types: Cigarettes  . Smokeless tobacco: Former Systems developer    Types: Secondary school teacher  . Vaping Use: Never used  Substance and Sexual Activity  . Alcohol use: Not Currently  . Drug use: Not Currently  . Sexual activity: Yes  Other Topics Concern  . 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  All other systems are negative.   Vital Signs: BP 115/73   Pulse 74   Temp 97.8 F (36.6 C) (Oral)   Resp 16   Ht 5\' 3"  (1.6 m)    Wt 64 kg   SpO2 100%  BMI 24.98 kg/m   Physical Exam Vitals reviewed.  Constitutional:      General: He is not in acute distress.    Appearance: Normal appearance.  HENT:     Head: Normocephalic and atraumatic.     Mouth/Throat:     Mouth: Mucous membranes are moist.  Cardiovascular:     Rate and Rhythm: Normal rate and regular rhythm.     Pulses: Normal pulses.     Heart sounds: Normal heart sounds.  Pulmonary:     Effort: Pulmonary effort is normal.     Comments: Diffused wheezing on LUL, RUL, RLL Abdominal:     General: Abdomen is flat.     Palpations: Abdomen is soft.  Skin:    General: Skin is warm and dry.  Neurological:     Mental Status: He is alert and oriented to person, place, and time.  Psychiatric:        Mood and Affect: Mood normal.        Behavior: Behavior normal.        Judgment: Judgment normal.     MD Evaluation Airway: WNL Heart: WNL Abdomen: WNL Chest/ Lungs: WNL ASA  Classification: 3 Mallampati/Airway Score: Two  Imaging: CT Chest W Contrast  Result Date: 04/29/2020 CLINICAL DATA:  Staging of pancreatic cancer. EXAM: CT CHEST WITH CONTRAST TECHNIQUE: Multidetector CT imaging of the chest was performed during intravenous contrast administration. CONTRAST:  36mL OMNIPAQUE IOHEXOL 300 MG/ML  SOLN COMPARISON:  CT of the abdomen and pelvis dated April 15, 2020. FINDINGS: Cardiovascular: Calcified atheromatous plaque in the thoracic aorta. Noncalcified plaque. Aorta is nonaneurysmal. Central pulmonary vasculature is normal caliber. Heart size is normal without pericardial effusion. Three-vessel coronary artery disease. Mediastinum/Nodes: No mediastinal adenopathy. Mild fullness of RIGHT hilar nodal tissue up to 13 mm short axis (image 64, series 2) Subcarinal nodal tissue up to 7-8 mm short axis, unenlarged. RIGHT infrahilar nodal tissue at 11 mm on image 81 of series 2 showing some subtle internal calcification. No axillary lymphadenopathy. No  thoracic inlet lymphadenopathy. No retropectoral lymphadenopathy. Lungs/Pleura: Numerous calcified pulmonary nodules, millimetric nodules scattered throughout the chest. Background pulmonary emphysema. Subpleural reticulation. No consolidation. No pleural effusion. Airways are patent. Small cavitary nodule in the RIGHT upper lobe 4 mm (image 53, series 5) Septal thickening more pronounced at the lung bases. Upper Abdomen: Signs of hepatic metastatic disease and pancreatic mass better displayed on recent CT of the abdomen. Vascular involvement in the upper abdomen not well evaluated. Replaced hepatic arterial supply is derive from the SMA partially visualized. Musculoskeletal: No acute or destructive bone finding. Spinal degenerative changes. IMPRESSION: 1. Numerous calcified pulmonary nodules indicative of prior granulomatous disease, associated with top-normal size RIGHT hilar lymph nodes which could also be related to prior infection. These findings are nonspecific. Suggest attention on follow-up. 2. Small cavitary nodule in the RIGHT upper lobe could be related to metastatic disease or infection but is in an atypical location for metastatic process and small size does not allow for definitive assessment. Suggest attention on follow-up evaluations. 3. Septal thickening and subpleural reticulation with basilar predominance. Findings may reflect interstitial lung disease. Correlate with respiratory symptoms in high-resolution CT of the chest follow-up as warranted for further assessment. 4. Signs of hepatic metastatic disease and pancreatic mass better displayed on recent CT of the abdomen. 5. Three-vessel coronary artery disease. 6. Emphysema and aortic atherosclerosis. Aortic Atherosclerosis (ICD10-I70.0) and Emphysema (ICD10-J43.9). Electronically Signed   By: Zetta Bills M.D.   On: 04/29/2020  15:06    Labs:  CBC: Recent Labs    04/01/20 1559 04/24/20 1108 05/08/20 1212 05/15/20 1340  WBC 9.1 8.7  8.5 10.6*  HGB 16.4 16.9 16.4 16.8  HCT 48.9 50.2 48.4 48.4  PLT 213.0 197 185 199    COAGS: Recent Labs    05/15/20 1340  INR 1.0    BMP: Recent Labs    08/11/19 1018 02/08/20 0726 04/01/20 1559 04/24/20 1108 05/08/20 1212  NA 132* 138 134* 136 133*  K 4.7 4.6 4.6 4.9 4.6  CL 97 104 98 98 96*  CO2 29  --  26 29 29   GLUCOSE 324* 267* 270* 250* 400*  BUN 17 26* 18 19 21*  CALCIUM 9.8  --  10.2 10.4* 10.2  CREATININE 0.87 0.70 0.92 1.11 1.06  GFRNONAA  --   --   --  >60 >60    LIVER FUNCTION TESTS: Recent Labs    08/11/19 1018 04/01/20 1559 04/24/20 1108 05/08/20 1212  BILITOT 0.8 0.6 0.7 1.0  AST 10 15 31  39  ALT 12 22 43 70*  ALKPHOS 69 158* 274* 405*  PROT 7.1 7.5 8.0 7.5  ALBUMIN 4.4 4.4 4.6 4.4    TUMOR MARKERS: No results for input(s): AFPTM, CEA, CA199, CHROMGRNA in the last 8760 hours.  Assessment and Plan: 60 y.o. male with metastatic pancreatic adenocarcinoma confirmed by pathology on 05/02/2020.  Patient underwent CT abdomen and pelvis on 04/15/2020 due to abdominal pain and weight loss which showed findings highly suggestive of primary pancreatic adenocarcinoma.  Patient underwent upper GI endoscopy with biopsy on 05/02/2020, pathology revealed malignant cells consistent with adenocarcinoma. After thorough discussion and shared decision making with his oncology team, patient decided to undergo a chemotherapy. IR was requested for image guided Port-A-Cath placement. N.p.o. since 7 am ASA 81 mg qd, Pletal 100mg  BID, Plavix 75 mg qd. No need for hold. INR 1.0, plt 199   Risks and benefits of image guided port-a-catheter placement was discussed with the patient including, but not limited to bleeding, infection, pneumothorax, or fibrin sheath development and need for additional procedures.  All of the patient's questions were answered, patient is agreeable to proceed. Consent signed and in chart.    Thank you for this interesting consult.  I  greatly enjoyed meeting Dean Singh and look forward to participating in their care.  A copy of this report was sent to the requesting provider on this date.  Electronically Signed: Tera Mater, PA-C 05/15/2020, 2:25 PM   I spent a total of  20 min  in face to cace in clinical consultation, greater than 50% of which was counseling/coordinating care for Port-A-Cath placement

## 2020-05-15 NOTE — Procedures (Signed)
Interventional Radiology Procedure Note  Procedure: Placement of a right IJ approach single lumen PowerPort.  Tip is positioned at the superior cavoatrial junction and catheter is ready for immediate use.  Complications: No immediate Recommendations:  - Ok to shower tomorrow - Do not submerge for 7 days - Routine line care   Signed,  Ravinder Lukehart K. Jeilyn Reznik, MD   

## 2020-05-15 NOTE — Progress Notes (Signed)
Pt refused CBG post port placement. States he feels fine and will check it when he gets home. Awake, alert, oriented, ambulatory, stable vital signs.  Coolidge Breeze, RN 05/15/2020

## 2020-05-20 ENCOUNTER — Inpatient Hospital Stay: Payer: Medicare Other

## 2020-05-20 ENCOUNTER — Other Ambulatory Visit: Payer: Self-pay | Admitting: Hematology & Oncology

## 2020-05-20 ENCOUNTER — Other Ambulatory Visit: Payer: Self-pay

## 2020-05-20 DIAGNOSIS — C259 Malignant neoplasm of pancreas, unspecified: Secondary | ICD-10-CM

## 2020-05-20 DIAGNOSIS — Z5111 Encounter for antineoplastic chemotherapy: Secondary | ICD-10-CM | POA: Diagnosis not present

## 2020-05-20 LAB — CBC WITH DIFFERENTIAL (CANCER CENTER ONLY)
Abs Immature Granulocytes: 0.08 10*3/uL — ABNORMAL HIGH (ref 0.00–0.07)
Basophils Absolute: 0.1 10*3/uL (ref 0.0–0.1)
Basophils Relative: 1 %
Eosinophils Absolute: 0.4 10*3/uL (ref 0.0–0.5)
Eosinophils Relative: 4 %
HCT: 45.7 % (ref 39.0–52.0)
Hemoglobin: 15.6 g/dL (ref 13.0–17.0)
Immature Granulocytes: 1 %
Lymphocytes Relative: 16 %
Lymphs Abs: 1.7 10*3/uL (ref 0.7–4.0)
MCH: 30.1 pg (ref 26.0–34.0)
MCHC: 34.1 g/dL (ref 30.0–36.0)
MCV: 88.2 fL (ref 80.0–100.0)
Monocytes Absolute: 0.7 10*3/uL (ref 0.1–1.0)
Monocytes Relative: 7 %
Neutro Abs: 7.9 10*3/uL — ABNORMAL HIGH (ref 1.7–7.7)
Neutrophils Relative %: 71 %
Platelet Count: 150 10*3/uL (ref 150–400)
RBC: 5.18 MIL/uL (ref 4.22–5.81)
RDW: 13 % (ref 11.5–15.5)
WBC Count: 10.8 10*3/uL — ABNORMAL HIGH (ref 4.0–10.5)
nRBC: 0 % (ref 0.0–0.2)

## 2020-05-20 LAB — CMP (CANCER CENTER ONLY)
ALT: 58 U/L — ABNORMAL HIGH (ref 0–44)
AST: 49 U/L — ABNORMAL HIGH (ref 15–41)
Albumin: 4.4 g/dL (ref 3.5–5.0)
Alkaline Phosphatase: 490 U/L — ABNORMAL HIGH (ref 38–126)
Anion gap: 7 (ref 5–15)
BUN: 17 mg/dL (ref 6–20)
CO2: 29 mmol/L (ref 22–32)
Calcium: 10.2 mg/dL (ref 8.9–10.3)
Chloride: 96 mmol/L — ABNORMAL LOW (ref 98–111)
Creatinine: 0.75 mg/dL (ref 0.61–1.24)
GFR, Estimated: >60 mL/min (ref >60)
Glucose, Bld: 332 mg/dL — ABNORMAL HIGH (ref 70–99)
Potassium: 4.5 mmol/L (ref 3.5–5.1)
Sodium: 132 mmol/L — ABNORMAL LOW (ref 135–145)
Total Bilirubin: 1 mg/dL (ref 0.3–1.2)
Total Protein: 7 g/dL (ref 6.5–8.1)

## 2020-05-20 MED ORDER — SODIUM CHLORIDE 0.9 % IV SOLN
400.0000 mg/m2 | Freq: Once | INTRAVENOUS | Status: AC
  Administered 2020-05-20: 676 mg via INTRAVENOUS
  Filled 2020-05-20: qty 33.8

## 2020-05-20 MED ORDER — PALONOSETRON HCL INJECTION 0.25 MG/5ML
0.2500 mg | Freq: Once | INTRAVENOUS | Status: AC
  Administered 2020-05-20: 0.25 mg via INTRAVENOUS

## 2020-05-20 MED ORDER — OXALIPLATIN CHEMO INJECTION 100 MG/20ML
88.0000 mg/m2 | Freq: Once | INTRAVENOUS | Status: AC
  Administered 2020-05-20: 150 mg via INTRAVENOUS
  Filled 2020-05-20: qty 20

## 2020-05-20 MED ORDER — HYDROMORPHONE HCL 1 MG/ML IJ SOLN
INTRAMUSCULAR | Status: AC
  Filled 2020-05-20: qty 2

## 2020-05-20 MED ORDER — ZOLMITRIPTAN 2.5 MG PO TABS: 2.5000 mg | ORAL_TABLET | ORAL | 2 refills | Status: DC | PRN

## 2020-05-20 MED ORDER — SODIUM CHLORIDE 0.9 % IV SOLN
10.0000 mg | Freq: Once | INTRAVENOUS | Status: AC
  Administered 2020-05-20: 10 mg via INTRAVENOUS
  Filled 2020-05-20: qty 10

## 2020-05-20 MED ORDER — HYDROMORPHONE HCL 1 MG/ML IJ SOLN
2.0000 mg | Freq: Once | INTRAMUSCULAR | Status: AC
  Administered 2020-05-20: 2 mg via INTRAVENOUS

## 2020-05-20 MED ORDER — SODIUM CHLORIDE 0.9 % IV SOLN
150.0000 mg/m2 | Freq: Once | INTRAVENOUS | Status: AC
  Administered 2020-05-20: 260 mg via INTRAVENOUS
  Filled 2020-05-20: qty 8

## 2020-05-20 MED ORDER — DEXTROSE 5 % IV SOLN
Freq: Once | INTRAVENOUS | Status: AC
  Filled 2020-05-20: qty 250

## 2020-05-20 MED ORDER — SODIUM CHLORIDE 0.9 % IV SOLN
150.0000 mg | Freq: Once | INTRAVENOUS | Status: AC
  Administered 2020-05-20: 150 mg via INTRAVENOUS
  Filled 2020-05-20: qty 150

## 2020-05-20 MED ORDER — PALONOSETRON HCL INJECTION 0.25 MG/5ML
INTRAVENOUS | Status: AC
  Filled 2020-05-20: qty 5

## 2020-05-20 MED ORDER — SODIUM CHLORIDE 0.9 % IV SOLN
2400.0000 mg/m2 | INTRAVENOUS | Status: DC
  Administered 2020-05-20: 4050 mg via INTRAVENOUS
  Filled 2020-05-20: qty 81

## 2020-05-20 NOTE — Patient Instructions (Signed)
[facility]  Discharge Instructions: Thank you for choosing [facility] to provide your oncology and hematology care.   If you have a lab appointment with the [facility], please go directly to the [facility] and check in at the registration area.  Wear comfortable clothing and clothing appropriate for easy access to any Portacath or PICC line.   We strive to give you quality time with your provider. You may need to reschedule your appointment if you arrive late (15 or more minutes).  Arriving late affects you and other patients whose appointments are after yours.  Also, if you miss three or more appointments without notifying the office, you may be dismissed from the clinic at the provider's discretion.      For prescription refill requests, have your pharmacy contact our office and allow 72 hours for refills to be completed.    Today you received the following chemotherapy and/or immunotherapy agents 5-fu, leucovorin, oxaliplatin, cpt-11, aloxi, emend, decadron.    To help prevent nausea and vomiting after your treatment, we encourage you to take your nausea medication as directed.  BELOW ARE SYMPTOMS THAT SHOULD BE REPORTED IMMEDIATELY: . *FEVER GREATER THAN 100.4 F (38 C) OR HIGHER . *CHILLS OR SWEATING . *NAUSEA AND VOMITING THAT IS NOT CONTROLLED WITH YOUR NAUSEA MEDICATION . *UNUSUAL SHORTNESS OF BREATH . *UNUSUAL BRUISING OR BLEEDING . *URINARY PROBLEMS (pain or burning when urinating, or frequent urination) . *BOWEL PROBLEMS (unusual diarrhea, constipation, pain near the anus) . TENDERNESS IN MOUTH AND THROAT WITH OR WITHOUT PRESENCE OF ULCERS (sore throat, sores in mouth, or a toothache) . UNUSUAL RASH, SWELLING OR PAIN  . UNUSUAL VAGINAL DISCHARGE OR ITCHING   Items with * indicate a potential emergency and should be followed up as soon as possible or go to the Emergency Department if any problems should occur.  Please show the  CHEMOTHERAPY ALERT CARD or IMMUNOTHERAPY ALERT CARD at check-in to the Emergency Department and triage nurse. Should you have questions after your visit or need to cancel or reschedule your appointment, please contact [facility]  [phone] and follow the prompts.  Office hours are 8:00 a.m. to 4:30 p.m. Monday - Friday. Please note that voicemails left after 4:00 p.m. may not be returned until the following business day.  We are closed weekends and major holidays. You have access to a nurse at all times for urgent questions. Please call the main number to the clinic [phone] and follow the prompts.  For any non-urgent questions, you may also contact your provider using MyChart. We now offer e-Visits for anyone 59 and older to request care online for non-urgent symptoms. For details visit [email or web address].   Also download the MyChart app! Go to the app store, search "MyChart", open the app, select [facility], and log in with your MyChart username and password.  Due to Covid, a mask is required upon entering the hospital/clinic. If you do not have a mask, one will be given to you upon arrival. For doctor visits, patients may have 1 support person aged 59 or older with them. For treatment visits, patients cannot have anyone with them due to current Covid guidelines and our immunocompromised population.

## 2020-05-20 NOTE — Patient Instructions (Signed)

## 2020-05-21 ENCOUNTER — Encounter: Payer: Self-pay | Admitting: *Deleted

## 2020-05-21 NOTE — Progress Notes (Signed)
Called patient to check on him after having his first treatment yesterday, while I was out of office.  He is having significant issues with gas/bloating/belching. He states his belches are so foul that he starts to get nauseous from them. He has a limited appetite due to this. He tried gas-x with limited success. He states BMs are normal, with having two yesterday.  He denies n/v outside of the belching. He states has no other side effects.  Reviewed symptoms with Dr Marin Olp. He would like the patient to try Mylanta and we can reassess symptoms tomorrow while in the office.   Oncology Nurse Navigator Documentation  Oncology Nurse Navigator Flowsheets 05/21/2020  Abnormal Finding Date -  Confirmed Diagnosis Date -  Diagnosis Status -  Planned Course of Treatment -  Phase of Treatment Chemo  Chemotherapy Actual Start Date: 05/20/2020  Navigator Follow Up Date: 06/04/2020  Navigator Follow Up Reason: Follow-up Appointment;Chemotherapy  Navigator Location [facility]  Referral Date to RadOnc/MedOnc -  Navigator Encounter Type Telephone  Telephone Symptom Mgt;Patient Update;Outgoing Call  Treatment Initiated Date 05/20/2020  Patient Visit Type MedOnc  Treatment Phase Active Tx  Barriers/Navigation Needs Coordination of Care;Education  Education Pain/ Symptom Management  Interventions Education;Psycho-Social Support  Acuity Level 2-Minimal Needs (1-2 Barriers Identified)  Coordination of Care -  Education Method Verbal  Support Groups/Services Friends and Family  Time Spent with Patient 30

## 2020-05-22 ENCOUNTER — Inpatient Hospital Stay: Payer: Medicare Other

## 2020-05-22 ENCOUNTER — Other Ambulatory Visit: Payer: Self-pay

## 2020-05-22 VITALS — BP 105/58 | HR 69 | Temp 98.0°F | Resp 19

## 2020-05-22 DIAGNOSIS — C259 Malignant neoplasm of pancreas, unspecified: Secondary | ICD-10-CM

## 2020-05-22 DIAGNOSIS — Z5111 Encounter for antineoplastic chemotherapy: Secondary | ICD-10-CM | POA: Diagnosis not present

## 2020-05-22 DIAGNOSIS — C787 Secondary malignant neoplasm of liver and intrahepatic bile duct: Secondary | ICD-10-CM

## 2020-05-22 MED ORDER — SODIUM CHLORIDE 0.9% FLUSH
10.0000 mL | INTRAVENOUS | Status: DC | PRN
  Administered 2020-05-22: 10 mL
  Filled 2020-05-22: qty 10

## 2020-05-22 MED ORDER — HEPARIN SOD (PORK) LOCK FLUSH 100 UNIT/ML IV SOLN
500.0000 [IU] | Freq: Once | INTRAVENOUS | Status: AC | PRN
  Administered 2020-05-22: 500 [IU]
  Filled 2020-05-22: qty 5

## 2020-05-22 NOTE — Patient Instructions (Signed)
Fluorouracil, 5-FU injection What is this medicine? FLUOROURACIL, 5-FU (flure oh YOOR a sil) is a chemotherapy drug. It slows the growth of cancer cells. This medicine is used to treat many types of cancer like breast cancer, colon or rectal cancer, pancreatic cancer, and stomach cancer. This medicine may be used for other purposes; ask your health care provider or pharmacist if you have questions. COMMON BRAND NAME(S): Adrucil What should I tell my health care provider before I take this medicine? They need to know if you have any of these conditions:  blood disorders  dihydropyrimidine dehydrogenase (DPD) deficiency  infection (especially a virus infection such as chickenpox, cold sores, or herpes)  kidney disease  liver disease  malnourished, poor nutrition  recent or ongoing radiation therapy  an unusual or allergic reaction to fluorouracil, other chemotherapy, other medicines, foods, dyes, or preservatives  pregnant or trying to get pregnant  breast-feeding How should I use this medicine? This drug is given as an infusion or injection into a vein. It is administered in a hospital or clinic by a specially trained health care professional. Talk to your pediatrician regarding the use of this medicine in children. Special care may be needed. Overdosage: If you think you have taken too much of this medicine contact a poison control center or emergency room at once. NOTE: This medicine is only for you. Do not share this medicine with others. What if I miss a dose? It is important not to miss your dose. Call your doctor or health care professional if you are unable to keep an appointment. What may interact with this medicine? Do not take this medicine with any of the following medications:  live virus vaccines This medicine may also interact with the following medications:  medicines that treat or prevent blood clots like warfarin, enoxaparin, and dalteparin This list may not  describe all possible interactions. Give your health care provider a list of all the medicines, herbs, non-prescription drugs, or dietary supplements you use. Also tell them if you smoke, drink alcohol, or use illegal drugs. Some items may interact with your medicine. What should I watch for while using this medicine? Visit your doctor for checks on your progress. This drug may make you feel generally unwell. This is not uncommon, as chemotherapy can affect healthy cells as well as cancer cells. Report any side effects. Continue your course of treatment even though you feel ill unless your doctor tells you to stop. In some cases, you may be given additional medicines to help with side effects. Follow all directions for their use. Call your doctor or health care professional for advice if you get a fever, chills or sore throat, or other symptoms of a cold or flu. Do not treat yourself. This drug decreases your body's ability to fight infections. Try to avoid being around people who are sick. This medicine may increase your risk to bruise or bleed. Call your doctor or health care professional if you notice any unusual bleeding. Be careful brushing and flossing your teeth or using a toothpick because you may get an infection or bleed more easily. If you have any dental work done, tell your dentist you are receiving this medicine. Avoid taking products that contain aspirin, acetaminophen, ibuprofen, naproxen, or ketoprofen unless instructed by your doctor. These medicines may hide a fever. Do not become pregnant while taking this medicine. Women should inform their doctor if they wish to become pregnant or think they might be pregnant. There is a potential   for serious side effects to an unborn child. Talk to your health care professional or pharmacist for more information. Do not breast-feed an infant while taking this medicine. Men should inform their doctor if they wish to father a child. This medicine may  lower sperm counts. Do not treat diarrhea with over the counter products. Contact your doctor if you have diarrhea that lasts more than 2 days or if it is severe and watery. This medicine can make you more sensitive to the sun. Keep out of the sun. If you cannot avoid being in the sun, wear protective clothing and use sunscreen. Do not use sun lamps or tanning beds/booths. What side effects may I notice from receiving this medicine? Side effects that you should report to your doctor or health care professional as soon as possible:  allergic reactions like skin rash, itching or hives, swelling of the face, lips, or tongue  low blood counts - this medicine may decrease the number of white blood cells, red blood cells and platelets. You may be at increased risk for infections and bleeding.  signs of infection - fever or chills, cough, sore throat, pain or difficulty passing urine  signs of decreased platelets or bleeding - bruising, pinpoint red spots on the skin, black, tarry stools, blood in the urine  signs of decreased red blood cells - unusually weak or tired, fainting spells, lightheadedness  breathing problems  changes in vision  chest pain  mouth sores  nausea and vomiting  pain, swelling, redness at site where injected  pain, tingling, numbness in the hands or feet  redness, swelling, or sores on hands or feet  stomach pain  unusual bleeding Side effects that usually do not require medical attention (report to your doctor or health care professional if they continue or are bothersome):  changes in finger or toe nails  diarrhea  dry or itchy skin  hair loss  headache  loss of appetite  sensitivity of eyes to the light  stomach upset  unusually teary eyes This list may not describe all possible side effects. Call your doctor for medical advice about side effects. You may report side effects to FDA at 1-800-FDA-1088. Where should I keep my medicine? This  drug is given in a hospital or clinic and will not be stored at home. NOTE: This sheet is a summary. It may not cover all possible information. If you have questions about this medicine, talk to your doctor, pharmacist, or health care provider.  2021 Elsevier/Gold Standard (2018-11-22 15:00:03)

## 2020-06-03 ENCOUNTER — Encounter: Payer: Self-pay | Admitting: Hematology & Oncology

## 2020-06-04 ENCOUNTER — Inpatient Hospital Stay: Payer: Medicare Other

## 2020-06-04 ENCOUNTER — Telehealth: Payer: Self-pay

## 2020-06-04 ENCOUNTER — Encounter: Payer: Self-pay | Admitting: *Deleted

## 2020-06-04 ENCOUNTER — Inpatient Hospital Stay (HOSPITAL_BASED_OUTPATIENT_CLINIC_OR_DEPARTMENT_OTHER): Payer: Medicare Other | Admitting: Hematology & Oncology

## 2020-06-04 ENCOUNTER — Other Ambulatory Visit: Payer: Self-pay

## 2020-06-04 ENCOUNTER — Encounter: Payer: Self-pay | Admitting: Hematology & Oncology

## 2020-06-04 VITALS — BP 126/60 | HR 76 | Temp 98.3°F | Resp 18 | Wt 134.0 lb

## 2020-06-04 VITALS — BP 123/70 | HR 63 | Temp 98.2°F | Resp 18

## 2020-06-04 DIAGNOSIS — Z79899 Other long term (current) drug therapy: Secondary | ICD-10-CM

## 2020-06-04 DIAGNOSIS — R197 Diarrhea, unspecified: Secondary | ICD-10-CM

## 2020-06-04 DIAGNOSIS — C787 Secondary malignant neoplasm of liver and intrahepatic bile duct: Secondary | ICD-10-CM

## 2020-06-04 DIAGNOSIS — Z5111 Encounter for antineoplastic chemotherapy: Secondary | ICD-10-CM | POA: Diagnosis not present

## 2020-06-04 DIAGNOSIS — C259 Malignant neoplasm of pancreas, unspecified: Secondary | ICD-10-CM

## 2020-06-04 DIAGNOSIS — Z7189 Other specified counseling: Secondary | ICD-10-CM

## 2020-06-04 DIAGNOSIS — C251 Malignant neoplasm of body of pancreas: Secondary | ICD-10-CM

## 2020-06-04 LAB — CBC WITH DIFFERENTIAL (CANCER CENTER ONLY)
Abs Immature Granulocytes: 0.04 10*3/uL (ref 0.00–0.07)
Basophils Absolute: 0.1 10*3/uL (ref 0.0–0.1)
Basophils Relative: 1 %
Eosinophils Absolute: 0.8 10*3/uL — ABNORMAL HIGH (ref 0.0–0.5)
Eosinophils Relative: 12 %
HCT: 41.8 % (ref 39.0–52.0)
Hemoglobin: 14.4 g/dL (ref 13.0–17.0)
Immature Granulocytes: 1 %
Lymphocytes Relative: 26 %
Lymphs Abs: 1.7 10*3/uL (ref 0.7–4.0)
MCH: 30.5 pg (ref 26.0–34.0)
MCHC: 34.4 g/dL (ref 30.0–36.0)
MCV: 88.6 fL (ref 80.0–100.0)
Monocytes Absolute: 0.7 10*3/uL (ref 0.1–1.0)
Monocytes Relative: 10 %
Neutro Abs: 3.2 10*3/uL (ref 1.7–7.7)
Neutrophils Relative %: 50 %
Platelet Count: 168 10*3/uL (ref 150–400)
RBC: 4.72 MIL/uL (ref 4.22–5.81)
RDW: 13.5 % (ref 11.5–15.5)
WBC Count: 6.5 10*3/uL (ref 4.0–10.5)
nRBC: 0 % (ref 0.0–0.2)

## 2020-06-04 LAB — CMP (CANCER CENTER ONLY)
ALT: 81 U/L — ABNORMAL HIGH (ref 0–44)
AST: 69 U/L — ABNORMAL HIGH (ref 15–41)
Albumin: 4.3 g/dL (ref 3.5–5.0)
Alkaline Phosphatase: 703 U/L — ABNORMAL HIGH (ref 38–126)
Anion gap: 10 (ref 5–15)
BUN: 13 mg/dL (ref 6–20)
CO2: 28 mmol/L (ref 22–32)
Calcium: 10.4 mg/dL — ABNORMAL HIGH (ref 8.9–10.3)
Chloride: 97 mmol/L — ABNORMAL LOW (ref 98–111)
Creatinine: 0.66 mg/dL (ref 0.61–1.24)
GFR, Estimated: >60 mL/min (ref >60)
Glucose, Bld: 309 mg/dL — ABNORMAL HIGH (ref 70–99)
Potassium: 4.3 mmol/L (ref 3.5–5.1)
Sodium: 135 mmol/L (ref 135–145)
Total Bilirubin: 1 mg/dL (ref 0.3–1.2)
Total Protein: 7.3 g/dL (ref 6.5–8.1)

## 2020-06-04 LAB — LACTATE DEHYDROGENASE: LDH: 227 U/L — ABNORMAL HIGH (ref 98–192)

## 2020-06-04 MED ORDER — SODIUM CHLORIDE 0.9 % IV SOLN
INTRAVENOUS | Status: DC
  Filled 2020-06-04 (×2): qty 250

## 2020-06-04 MED ORDER — ATROPINE SULFATE 1 MG/ML IJ SOLN: 0.4000 mg | Freq: Once | INTRAMUSCULAR | Status: DC

## 2020-06-04 MED ORDER — PROCHLORPERAZINE EDISYLATE 10 MG/2ML IJ SOLN
10.0000 mg | Freq: Once | INTRAMUSCULAR | Status: AC
Start: 2020-06-04 — End: 2020-06-04
  Administered 2020-06-04: 10 mg via INTRAVENOUS
  Filled 2020-06-04: qty 2

## 2020-06-04 MED ORDER — ATROPINE SULFATE 1 MG/ML IJ SOLN
INTRAMUSCULAR | Status: AC
  Filled 2020-06-04: qty 1

## 2020-06-04 MED ORDER — HEPARIN SOD (PORK) LOCK FLUSH 100 UNIT/ML IV SOLN
500.0000 [IU] | Freq: Once | INTRAVENOUS | Status: AC
  Administered 2020-06-04: 500 [IU] via INTRAVENOUS
  Filled 2020-06-04: qty 5

## 2020-06-04 MED ORDER — PROCHLORPERAZINE MALEATE 10 MG PO TABS
ORAL_TABLET | ORAL | Status: AC
  Filled 2020-06-04: qty 1

## 2020-06-04 MED ORDER — SODIUM CHLORIDE 0.9% FLUSH
10.0000 mL | Freq: Once | INTRAVENOUS | Status: AC
  Administered 2020-06-04: 10 mL
  Filled 2020-06-04: qty 10

## 2020-06-04 MED ORDER — ATROPINE SULFATE 1 MG/ML IJ SOLN
0.4000 mg | Freq: Once | INTRAMUSCULAR | Status: AC
Start: 2020-06-04 — End: 2020-06-04
  Administered 2020-06-04: 0.4 mg via INTRAVENOUS

## 2020-06-04 NOTE — Progress Notes (Signed)
Hematology and Oncology Follow Up Visit  Dean Singh [id number] [birth year] 60 y.o. 06/04/2020   Principle Diagnosis:   Metastatic adenocarcinoma the pancreas-hepatic metastasis  Current Therapy:    FOLFIRINOX-s/p cycle #1 - start on 05/20/2020     Interim History:  Dean Singh is back for follow-up.  Unfortunately, he really had a tough time with the first cycle of treatment.  He did have a lot of diarrhea.  He is having a lot of abdominal spasms.  We will going give a dose of atropine today.  I just do not think that he is going to be able to take any treatment today.  He just is not strong enough.  We will have to make a dosage adjustments on his protocol.  We had to cut back on the irinotecan.  We will cut back a little bit on the oxaliplatin in the 5-a few infusion.  He has had no bleeding.  There is been no fever.  He has had no cough or shortness of breath.  Has been no problems with mouth sores.  He has had nausea.  I will think there is been any vomiting.  Currently, I would say his performance status is ECOG 2.    Medications:  Current Outpatient Medications:  .  aspirin EC 81 MG tablet, Take 1 tablet (81 mg total) by mouth daily., Disp:  , Rfl:  .  cilostazol (PLETAL) 100 MG tablet, TAKE 1 TABLET BY MOUTH TWICE A DAY, Disp: 180 tablet, Rfl: 1 .  clopidogrel (PLAVIX) 75 MG tablet, TAKE 1 TABLET BY MOUTH EVERY DAY (Patient taking differently: Take 75 mg by mouth daily.), Disp: 90 tablet, Rfl: 3 .  Continuous Blood Gluc Receiver (FREESTYLE LIBRE 14 DAY READER) DEVI, Use Freestyle Libre meter to monitor blood sugars. DX:E11.9, Disp: 1 each, Rfl: 0 .  Continuous Blood Gluc Sensor (FREESTYLE LIBRE 14 DAY SENSOR) MISC, Apply one sensor once every 14 days to monitor blood sugars. DX:E11.9, Disp: 2 each, Rfl: 2 .  dapagliflozin propanediol (FARXIGA) 5 MG TABS tablet, Take 1 tablet (5 mg total) by mouth daily before breakfast., Disp: 30 tablet, Rfl: 6 .  dronabinol (MARINOL) 5 MG  capsule, Take 1 capsule (5 mg total) by mouth 2 (two) times daily before lunch and supper., Disp: 60 capsule, Rfl: 0 .  ezetimibe (ZETIA) 10 MG tablet, TAKE 1 TABLET BY MOUTH EVERY DAY (Patient taking differently: Take 10 mg by mouth daily.), Disp: 90 tablet, Rfl: 3 .  famotidine (PEPCID) 40 MG tablet, TAKE 1 TABLET BY MOUTH EVERY DAY, Disp: 90 tablet, Rfl: 1 .  fentaNYL (DURAGESIC) 50 MCG/HR, Place 1 patch onto the skin every 3 (three) days., Disp: 10 patch, Rfl: 0 .  glucose blood (ONETOUCH ULTRA) test strip, USE TWICE DAILY AS DIRECTED ( E11.65), Disp: 200 each, Rfl: 12 .  HYDROcodone-acetaminophen (NORCO) 5-325 MG tablet, Take 1-2 tablets by mouth every 6 (six) hours as needed for moderate pain or severe pain., Disp: 60 tablet, Rfl: 0 .  Insulin Glargine (BASAGLAR KWIKPEN) 100 UNIT/ML, Inject 20 Units into the skin daily., Disp: 30 mL, Rfl: 4 .  Insulin Pen Needle 32G X 4 MM MISC, 1 Device by Does not apply route daily., Disp: 50 each, Rfl: 6 .  JANUVIA 100 MG tablet, TAKE 1 TABLET BY MOUTH EVERY DAY, Disp: 90 tablet, Rfl: 1 .  lidocaine-prilocaine (EMLA) cream, Apply to affected area once, Disp: 30 g, Rfl: 3 .  loperamide (IMODIUM A-D) 2 MG tablet, Take 2  at onset of diarrhea, then 1 every 2hrs until 12hr without a BM. May take 2 tab every 4hrs at bedtime. If diarrhea recurs repeat., Disp: 100 tablet, Rfl: 1 .  metFORMIN (GLUCOPHAGE) 1000 MG tablet, Take 1 tablet (1,000 mg total) by mouth daily with breakfast., Disp: 90 tablet, Rfl: 3 .  nitroGLYCERIN (NITROSTAT) 0.4 MG SL tablet, Place 0.4 mg under the tongue every 5 (five) minutes as needed for chest pain., Disp: , Rfl:  .  ondansetron (ZOFRAN) 4 MG tablet, Take 1 tablet (4 mg total) by mouth every 8 (eight) hours as needed for nausea or vomiting., Disp: 40 tablet, Rfl: 0 .  ondansetron (ZOFRAN) 8 MG tablet, Take 1 tablet (8 mg total) by mouth 2 (two) times daily as needed. Start on day 3 after chemotherapy., Disp: 30 tablet, Rfl: 1 .   pantoprazole (PROTONIX) 40 MG tablet, Take 1 tablet (40 mg total) by mouth daily., Disp: 90 tablet, Rfl: 3 .  pregabalin (LYRICA) 300 MG capsule, Take 1 capsule (300 mg total) by mouth at bedtime., Disp: 30 capsule, Rfl: 6 .  prochlorperazine (COMPAZINE) 10 MG tablet, Take 1 tablet (10 mg total) by mouth every 6 (six) hours as needed (Nausea or vomiting)., Disp: 30 tablet, Rfl: 1 .  rosuvastatin (CRESTOR) 5 MG tablet, TAKE 1 TABLET BY MOUTH EVERY DAY (Patient taking differently: Take 5 mg by mouth daily.), Disp: 90 tablet, Rfl: 0 .  Saw Palmetto, Serenoa repens, (SAW PALMETTO PO), Take 1 capsule by mouth daily as needed (When having prostate problems). , Disp: , Rfl:  .  SUMAtriptan (IMITREX) 25 MG tablet, Take 1 tablet (25 mg total) by mouth every 2 (two) hours as needed for migraine., Disp: 30 tablet, Rfl: 0 .  Wheat Dextrin (BENEFIBER PO), Take 1 Scoop by mouth daily., Disp: , Rfl:   Allergies:  Allergies  Allergen Reactions  . Statins Itching    Atorvastatin cause "my skin to feel horrible and unbearable".  . Morphine Nausea And Vomiting    "felt like chest was exploding" pt reports     Past Medical History, Surgical history, Social history, and Family History were reviewed and updated.  Review of Systems: Review of Systems  Constitutional: Positive for fatigue.  HENT:  Negative.   Eyes: Negative.   Respiratory: Negative.   Cardiovascular: Negative.   Gastrointestinal: Positive for abdominal pain.  Endocrine: Negative.   Musculoskeletal: Positive for arthralgias, flank pain and myalgias.  Skin: Negative.   Neurological: Negative.   Hematological: Negative.   Psychiatric/Behavioral: Negative.     Physical Exam:  weight is 134 lb (60.8 kg). His oral temperature is 98.3 F (36.8 C). His blood pressure is 126/60 and his pulse is 76. His respiration is 18 and oxygen saturation is 98%.   Wt Readings from Last 3 Encounters:  06/04/20 134 lb (60.8 kg)  05/15/20 141 lb (64 kg)   05/08/20 141 lb (64 kg)    Physical Exam Vitals reviewed.  HENT:     Head: Normocephalic and atraumatic.  Eyes:     Pupils: Pupils are equal, round, and reactive to light.  Cardiovascular:     Rate and Rhythm: Normal rate and regular rhythm.     Heart sounds: Normal heart sounds.  Pulmonary:     Effort: Pulmonary effort is normal.     Breath sounds: Normal breath sounds.  Abdominal:     General: Bowel sounds are normal.     Palpations: Abdomen is soft.  Musculoskeletal:  General: No tenderness or deformity. Normal range of motion.     Cervical back: Normal range of motion.  Lymphadenopathy:     Cervical: No cervical adenopathy.  Skin:    General: Skin is warm and dry.     Findings: No erythema or rash.  Neurological:     Mental Status: He is alert and oriented to person, place, and time.  Psychiatric:        Behavior: Behavior normal.        Thought Content: Thought content normal.        Judgment: Judgment normal.      Lab Results  Component Value Date   WBC 6.5 06/04/2020   HGB 14.4 06/04/2020   HCT 41.8 06/04/2020   MCV 88.6 06/04/2020   PLT 168 06/04/2020     Chemistry      Component Value Date/Time   NA 135 06/04/2020 0942   NA 132 (L) 05/01/2019 1108   K 4.3 06/04/2020 0942   CL 97 (L) 06/04/2020 0942   CO2 28 06/04/2020 0942   BUN 13 06/04/2020 0942   BUN 18 05/01/2019 1108   CREATININE 0.66 06/04/2020 0942      Component Value Date/Time   CALCIUM 10.4 (H) 06/04/2020 0942   ALKPHOS 703 (H) 06/04/2020 0942   AST 69 (H) 06/04/2020 0942   ALT 81 (H) 06/04/2020 0942   BILITOT 1.0 06/04/2020 0942      Impression and Plan: Dean Singh is a very nice 60 year old white male.  He has metastatic adenocarcinoma the pancreas.  He has extensive liver metastasis.  Again, we will hold off on his chemotherapy today.  I will give him some IV fluid.  I will give him some IV atropine along with some Compazine.  Maybe this will help a little bit.  I would  like to try him on some Donnatal but I do think insurance will cover this.  We will see about maybe Hyosamine.  Maybe this will help little bit with the spasms.  He will get IV fluid today.  Will have him come back in 1 week.  We will see how he is feeling then.  Hopefully, he will be feeling better so we can do treatment.  I know that he does have extensive disease.  Quality of life is really what we are shooting for.   Volanda Napoleon, MD 5/31/202210:26 AM

## 2020-06-04 NOTE — Patient Instructions (Signed)
Dehydration, Adult Dehydration is a condition in which there is not enough water or other fluids in the body. This happens when a person loses more fluids than he or she takes in. Important organs, such as the kidneys, brain, and heart, cannot function without a proper amount of fluids. Any loss of fluids from the body can lead to dehydration. Dehydration can be mild, moderate, or severe. It should be treated right away to prevent it from becoming severe. What are the causes? Dehydration may be caused by:  Conditions that cause loss of water or other fluids, such as diarrhea, vomiting, or sweating or urinating a lot.  Not drinking enough fluids, especially when you are ill or doing activities that require a lot of energy.  Other illnesses and conditions, such as fever or infection.  Certain medicines, such as medicines that remove excess fluid from the body (diuretics).  Lack of safe drinking water.  Not being able to get enough water and food. What increases the risk? The following factors may make you more likely to develop this condition:  Having a long-term (chronic) illness that has not been treated properly, such as diabetes, heart disease, or kidney disease.  Being 65 years of age or older.  Having a disability.  Living in a place that is high in altitude, where thinner, drier air causes more fluid loss.  Doing exercises that put stress on your body for a long time (endurance sports). What are the signs or symptoms? Symptoms of dehydration depend on how severe it is. Mild or moderate dehydration  Thirst.  Dry lips or dry mouth.  Dizziness or light-headedness, especially when standing up from a seated position.  Muscle cramps.  Dark urine. Urine may be the color of tea.  Less urine or tears produced than usual.  Headache. Severe dehydration  Changes in skin. Your skin may be cold and clammy, blotchy, or pale. Your skin also may not return to normal after being  lightly pinched and released.  Little or no tears, urine, or sweat.  Changes in vital signs, such as rapid breathing and low blood pressure. Your pulse may be weak or may be faster than 100 beats a minute when you are sitting still.  Other changes, such as: ? Feeling very thirsty. ? Sunken eyes. ? Cold hands and feet. ? Confusion. ? Being very tired (lethargic) or having trouble waking from sleep. ? Short-term weight loss. ? Loss of consciousness. How is this diagnosed? This condition is diagnosed based on your symptoms and a physical exam. You may have blood and urine tests to help confirm the diagnosis. How is this treated? Treatment for this condition depends on how severe it is. Treatment should be started right away. Do not wait until dehydration becomes severe. Severe dehydration is an emergency and needs to be treated in a hospital.  Mild or moderate dehydration can be treated at home. You may be asked to: ? Drink more fluids. ? Drink an oral rehydration solution (ORS). This drink helps restore proper amounts of fluids and salts and minerals in the blood (electrolytes).  Severe dehydration can be treated: ? With IV fluids. ? By correcting abnormal levels of electrolytes. This is often done by giving electrolytes through a tube that is passed through your nose and into your stomach (nasogastric tube, or NG tube). ? By treating the underlying cause of dehydration. Follow these instructions at home: Oral rehydration solution If told by your health care provider, drink an ORS:  Make   an ORS by following instructions on the package.  Start by drinking small amounts, about  cup (120 mL) every 5-10 minutes.  Slowly increase how much you drink until you have taken the amount recommended by your health care provider. Eating and drinking  Drink enough clear fluid to keep your urine pale yellow. If you were told to drink an ORS, finish the ORS first and then start slowly drinking  other clear fluids. Drink fluids such as: ? Water. Do not drink only water. Doing that can lead to hyponatremia, which is having too little salt (sodium) in the body. ? Water from ice chips you suck on. ? Fruit juice that you have added water to (diluted fruit juice). ? Low-calorie sports drinks.  Eat foods that contain a healthy balance of electrolytes, such as bananas, oranges, potatoes, tomatoes, and spinach.  Do not drink alcohol.  Avoid the following: ? Drinks that contain a lot of sugar. These include high-calorie sports drinks, fruit juice that is not diluted, and soda. ? Caffeine. ? Foods that are greasy or contain a lot of fat or sugar.         General instructions  Take over-the-counter and prescription medicines only as told by your health care provider.  Do not take sodium tablets. Doing that can lead to having too much sodium in the body (hypernatremia).  Return to your normal activities as told by your health care provider. Ask your health care provider what activities are safe for you.  Keep all follow-up visits as told by your health care provider. This is important. Contact a health care provider if:  You have muscle cramps, pain, or discomfort, such as: ? Pain in your abdomen and the pain gets worse or stays in one area (localizes). ? Stiff neck.  You have a rash.  You are more irritable than usual.  You are sleepier or have a harder time waking than usual.  You feel weak or dizzy.  You feel very thirsty. Get help right away if you have:  Any symptoms of severe dehydration.  Symptoms of vomiting, such as: ? You cannot eat or drink without vomiting. ? Vomiting gets worse or does not go away. ? Vomit includes blood or green matter (bile).  Symptoms that get worse with treatment.  A fever.  A severe headache.  Problems with urination or bowel movements, such as: ? Diarrhea that gets worse or does not go away. ? Blood in your stool (feces).  This may cause stool to look black and tarry. ? Not urinating, or urinating only a small amount of very dark urine, within 6-8 hours.  Trouble breathing. These symptoms may represent a serious problem that is an emergency. Do not wait to see if the symptoms will go away. Get medical help right away. Call your local emergency services (911 in the [place] Do not drive yourself to the hospital. Summary  Dehydration is a condition in which there is not enough water or other fluids in the body. This happens when a person loses more fluids than he or she takes in.  Treatment for this condition depends on how severe it is. Treatment should be started right away. Do not wait until dehydration becomes severe.  Drink enough clear fluid to keep your urine pale yellow. If you were told to drink an oral rehydration solution (ORS), finish the ORS first and then start slowly drinking other clear fluids.  Take over-the-counter and prescription medicines only as told by your health   care provider.  Get help right away if you have any symptoms of severe dehydration. This information is not intended to replace advice given to you by your health care provider. Make sure you discuss any questions you have with your health care provider. Document Revised: 08/04/2018 Document Reviewed: 08/04/2018 Elsevier Patient Education  2021 Else[employer] Inc.  

## 2020-06-04 NOTE — Patient Instructions (Signed)
Tunneled Central Venous Catheter Flushing Guide  It is important to flush your tunneled central venous catheter each time you use it, both before and after you use it. Flushing your catheter will help prevent it from clogging. What are the risks? Risks may include:  Infection.  Air getting into the catheter and bloodstream. Supplies needed:  A clean pair of gloves.  A disinfecting wipe. Use an alcohol wipe, chlorhexidine wipe, or iodine wipe as told by your health care provider.  A 10 mL syringe that has been prefilled with saline solution.  An empty 10 mL syringe, if a substance called heparin was injected into your catheter. How to flush your catheter When you flush your catheter, make sure you follow any specific instructions from your health care provider or the manufacturer. These are general guidelines. Flushing your catheter before use If there is heparin in your catheter: 1. Wash your hands with soap and water. 2. Put on gloves. 3. Scrub the injection cap for a minimum of 15 seconds with a disinfecting wipe. 4. Unclamp the catheter. 5. Attach the empty syringe to the injection cap. 6. Pull the syringe plunger back and withdraw 10 mL of blood. 7. Place the syringe into an appropriate waste container. 8. Scrub the injection cap for 15 seconds with a disinfecting wipe. 9. Attach the prefilled syringe to the injection cap. 10. Flush the catheter by pushing the plunger forward until all the liquid from the syringe is in the catheter. 11. Remove the syringe from the injection cap. 12. Clamp the catheter. If there is no heparin in your catheter: 1. Wash your hands with soap and water. 2. Put on gloves. 3. Scrub the injection cap for 15 seconds with a disinfecting wipe. 4. Unclamp the catheter. 5. Attach the prefilled syringe to the injection cap. 6. Flush the catheter by pushing the plunger forward until 5 mL of the liquid from the syringe is in the catheter. 7. Pull back on  the syringe until you see blood in the catheter. 8. If you have been asked to collect any blood, follow your health care provider's instructions. Otherwise, flush the catheter with the rest of the solution from the syringe. 9. Remove the syringe from the injection cap. 10. Clamp the catheter.   Flushing your catheter after use 1. Wash your hands with soap and water. 2. Put on gloves. 3. Scrub the injection cap for 15 seconds with a disinfecting wipe. 4. Unclamp the catheter. 5. Attach the prefilled syringe to the injection cap. 6. Flush the catheter by pushing the plunger forward until all of the liquid from the syringe is in the catheter. 7. Remove the syringe from the injection cap. 8. Clamp the catheter. Problems and solutions  If blood cannot be completely cleared from the injection cap, you may need to have the injection cap replaced.  If the catheter is difficult to flush, use the pulsing method. The pulsing method involves pushing only a few milliliters of solution into the catheter at a time and pausing between pushes.  If you do not see blood in the catheter when you pull back on the syringe, change your body position, such as by raising your arms above your head. Take a deep breath and cough. Then, pull back on the syringe. If you still do not see blood, flush the catheter with a small amount of solution. Then, change positions again and take a breath or cough. Pull back on the syringe again. If you still do not   see blood, finish flushing the catheter and contact your health care provider. Do not use your catheter until your health care provider says it is okay. General tips  Have someone help you flush your catheter, if possible.  Do not force fluid through your catheter.  Do not use a syringe that is larger or smaller than 10 mL. Using a smaller syringe can make the catheter burst.  Do not use your catheter without flushing it first if it has heparin in it. Contact a health  care provider if:  You cannot see any blood in the catheter when you flush it before using it.  Your catheter is difficult to flush. Get help right away if:  You cannot flush the catheter.  The catheter leaks when you flush it or when there is fluid in it.  There are cracks or breaks in the catheter. Summary  It is important to flush your tunneled central venous catheter each time you use it, both before and after you use it.  Scrub the injection cap for 15 seconds with a disinfecting wipe before and after you flush it.  When you flush your catheter, make sure you follow any specific instructions from your health care provider or the manufacturer.  Get help right away if you cannot flush the catheter. This information is not intended to replace advice given to you by your health care provider. Make sure you discuss any questions you have with your health care provider. Document Revised: 03/02/2019 Document Reviewed: 03/09/2018 Elsevier Patient Education  2021 Elsevier Inc.  

## 2020-06-04 NOTE — Telephone Encounter (Signed)
appts made per 06/04/20 los and pt will gain sch in tx/avs and through [facility]

## 2020-06-04 NOTE — Progress Notes (Signed)
Patient has had a very difficult two weeks with initially n/v and then diarrhea. He is not able to get treatment today. It will be delayed one week and dose adjusted. Spoke to patient and his wife and they want treatment, but they also want quality of life.   Encouraged the patient to call the office tomorrow if things don't improve, but I will also call for an update on Thursday to make sure his symptoms have decreased/subsided.   Oncology Nurse Navigator Documentation  Oncology Nurse Navigator Flowsheets 06/04/2020  Abnormal Finding Date -  Confirmed Diagnosis Date -  Diagnosis Status -  Planned Course of Treatment -  Phase of Treatment -  Chemotherapy Actual Start Date: -  Navigator Follow Up Date: 06/06/2020  Navigator Follow Up Reason: Symptom Management  Navigator Location [facility]  Referral Date to RadOnc/MedOnc -  Navigator Encounter Type Treatment;Appt/Treatment Plan Review  Telephone -  Treatment Initiated Date -  Patient Visit Type MedOnc  Treatment Phase Active Tx  Barriers/Navigation Needs Coordination of Care;Education  Education Pain/ Symptom Management  Interventions Education;Psycho-Social Support  Acuity Level 2-Minimal Needs (1-2 Barriers Identified)  Coordination of Care -  Education Method Verbal  Support Groups/Services Friends and Family  Time Spent with Patient 15

## 2020-06-05 LAB — CANCER ANTIGEN 19-9: CA 19-9: 105467 U/mL — ABNORMAL HIGH (ref 0–35)

## 2020-06-06 ENCOUNTER — Encounter: Payer: Self-pay | Admitting: *Deleted

## 2020-06-06 ENCOUNTER — Other Ambulatory Visit: Payer: Self-pay | Admitting: Hematology & Oncology

## 2020-06-06 ENCOUNTER — Inpatient Hospital Stay: Payer: Medicare Other

## 2020-06-06 NOTE — Progress Notes (Signed)
Patient calling asking questions about his labs. His CA19.9 has increased since prior draw. He is worries that this increase means the treatment didn't work and he will need to stop.   Asked patient how his physical symptoms are. He states he feels much better after his infusion and prn medications he received on Monday.   Spoke to Dr Marin Olp. He isn't worried at this time as his baseline draw was done almost one month prior to his treatment. The level could have been higher prior to treatment. Dr Marin Olp will use the next level drawn to determine efficacy of treatment.   Reviewed Dr Antonieta Pert response with patient. He was appreciative of the information.   Oncology Nurse Navigator Documentation  Oncology Nurse Navigator Flowsheets 06/06/2020  Abnormal Finding Date -  Confirmed Diagnosis Date -  Diagnosis Status -  Planned Course of Treatment -  Phase of Treatment -  Chemotherapy Actual Start Date: -  Navigator Follow Up Date: 06/12/2020  Navigator Follow Up Reason: Follow-up Appointment;Chemotherapy  Navigator Location [facility]  Referral Date to RadOnc/MedOnc -  Navigator Encounter Type Telephone  Telephone Symptom Mgt;Diagnostic Results  Treatment Initiated Date -  Patient Visit Type MedOnc  Treatment Phase Active Tx  Barriers/Navigation Needs Coordination of Care;Education  Education Other  Interventions Education;Psycho-Social Support  Acuity Level 2-Minimal Needs (1-2 Barriers Identified)  Coordination of Care -  Education Method Verbal  Support Groups/Services Friends and Family  Time Spent with Patient 30

## 2020-06-07 ENCOUNTER — Other Ambulatory Visit: Payer: Self-pay | Admitting: *Deleted

## 2020-06-07 MED ORDER — DRONABINOL 5 MG PO CAPS: 5.0000 mg | ORAL_CAPSULE | Freq: Two times a day (BID) | ORAL | 0 refills | Status: DC

## 2020-06-07 MED ORDER — FENTANYL 50 MCG/HR TD PT72: 1.0000 | MEDICATED_PATCH | TRANSDERMAL | 0 refills | Status: AC

## 2020-06-10 ENCOUNTER — Other Ambulatory Visit: Payer: Self-pay | Admitting: Cardiology

## 2020-06-10 ENCOUNTER — Other Ambulatory Visit: Payer: Self-pay | Admitting: Internal Medicine

## 2020-06-10 NOTE — Telephone Encounter (Signed)
Please advise. I cannot refill the control medication

## 2020-06-12 ENCOUNTER — Encounter: Payer: Self-pay | Admitting: Hematology & Oncology

## 2020-06-12 ENCOUNTER — Other Ambulatory Visit: Payer: Self-pay

## 2020-06-12 ENCOUNTER — Inpatient Hospital Stay: Payer: Medicare Other

## 2020-06-12 ENCOUNTER — Inpatient Hospital Stay (HOSPITAL_BASED_OUTPATIENT_CLINIC_OR_DEPARTMENT_OTHER): Payer: Medicare Other | Admitting: Hematology & Oncology

## 2020-06-12 ENCOUNTER — Inpatient Hospital Stay: Payer: Medicare Other | Attending: Hematology & Oncology

## 2020-06-12 ENCOUNTER — Encounter: Payer: Self-pay | Admitting: *Deleted

## 2020-06-12 VITALS — BP 136/68 | HR 75 | Temp 97.9°F | Resp 16 | Wt 137.0 lb

## 2020-06-12 DIAGNOSIS — C787 Secondary malignant neoplasm of liver and intrahepatic bile duct: Secondary | ICD-10-CM

## 2020-06-12 DIAGNOSIS — C259 Malignant neoplasm of pancreas, unspecified: Secondary | ICD-10-CM | POA: Insufficient documentation

## 2020-06-12 DIAGNOSIS — Z5111 Encounter for antineoplastic chemotherapy: Secondary | ICD-10-CM | POA: Insufficient documentation

## 2020-06-12 DIAGNOSIS — R197 Diarrhea, unspecified: Secondary | ICD-10-CM | POA: Diagnosis not present

## 2020-06-12 DIAGNOSIS — Z452 Encounter for adjustment and management of vascular access device: Secondary | ICD-10-CM | POA: Insufficient documentation

## 2020-06-12 DIAGNOSIS — R7989 Other specified abnormal findings of blood chemistry: Secondary | ICD-10-CM | POA: Insufficient documentation

## 2020-06-12 LAB — CMP (CANCER CENTER ONLY)
ALT: 56 U/L — ABNORMAL HIGH (ref 0–44)
AST: 46 U/L — ABNORMAL HIGH (ref 15–41)
Albumin: 4.2 g/dL (ref 3.5–5.0)
Alkaline Phosphatase: 561 U/L — ABNORMAL HIGH (ref 38–126)
Anion gap: 7 (ref 5–15)
BUN: 15 mg/dL (ref 6–20)
CO2: 28 mmol/L (ref 22–32)
Calcium: 9.8 mg/dL (ref 8.9–10.3)
Chloride: 101 mmol/L (ref 98–111)
Creatinine: 0.78 mg/dL (ref 0.61–1.24)
GFR, Estimated: >60 mL/min (ref >60)
Glucose, Bld: 222 mg/dL — ABNORMAL HIGH (ref 70–99)
Potassium: 4.2 mmol/L (ref 3.5–5.1)
Sodium: 136 mmol/L (ref 135–145)
Total Bilirubin: 0.7 mg/dL (ref 0.3–1.2)
Total Protein: 6.9 g/dL (ref 6.5–8.1)

## 2020-06-12 LAB — CBC WITH DIFFERENTIAL (CANCER CENTER ONLY)
Abs Immature Granulocytes: 0.13 10*3/uL — ABNORMAL HIGH (ref 0.00–0.07)
Basophils Absolute: 0.1 10*3/uL (ref 0.0–0.1)
Basophils Relative: 1 %
Eosinophils Absolute: 0.5 10*3/uL (ref 0.0–0.5)
Eosinophils Relative: 7 %
HCT: 40.7 % (ref 39.0–52.0)
Hemoglobin: 13.9 g/dL (ref 13.0–17.0)
Immature Granulocytes: 2 %
Lymphocytes Relative: 26 %
Lymphs Abs: 1.9 10*3/uL (ref 0.7–4.0)
MCH: 30.6 pg (ref 26.0–34.0)
MCHC: 34.2 g/dL (ref 30.0–36.0)
MCV: 89.6 fL (ref 80.0–100.0)
Monocytes Absolute: 0.8 10*3/uL (ref 0.1–1.0)
Monocytes Relative: 11 %
Neutro Abs: 3.9 10*3/uL (ref 1.7–7.7)
Neutrophils Relative %: 53 %
Platelet Count: 151 10*3/uL (ref 150–400)
RBC: 4.54 MIL/uL (ref 4.22–5.81)
RDW: 14.6 % (ref 11.5–15.5)
WBC Count: 7.4 10*3/uL (ref 4.0–10.5)
nRBC: 0 % (ref 0.0–0.2)

## 2020-06-12 LAB — LACTATE DEHYDROGENASE: LDH: 252 U/L — ABNORMAL HIGH (ref 98–192)

## 2020-06-12 MED ORDER — PALONOSETRON HCL INJECTION 0.25 MG/5ML
INTRAVENOUS | Status: AC
  Filled 2020-06-12: qty 5

## 2020-06-12 MED ORDER — SODIUM CHLORIDE 0.9 % IV SOLN
120.0000 mg/m2 | Freq: Once | INTRAVENOUS | Status: AC
  Administered 2020-06-12: 200 mg via INTRAVENOUS
  Filled 2020-06-12: qty 10

## 2020-06-12 MED ORDER — ATROPINE SULFATE 1 MG/ML IJ SOLN
INTRAMUSCULAR | Status: AC
  Filled 2020-06-12: qty 1

## 2020-06-12 MED ORDER — SODIUM CHLORIDE 0.9 % IV SOLN
150.0000 mg | Freq: Once | INTRAVENOUS | Status: AC
  Administered 2020-06-12: 150 mg via INTRAVENOUS
  Filled 2020-06-12: qty 150

## 2020-06-12 MED ORDER — ATROPINE SULFATE 1 MG/ML IJ SOLN
0.5000 mg | Freq: Once | INTRAMUSCULAR | Status: AC | PRN
  Administered 2020-06-12: 0.5 mg via INTRAVENOUS

## 2020-06-12 MED ORDER — OXALIPLATIN CHEMO INJECTION 100 MG/20ML
76.5000 mg/m2 | Freq: Once | INTRAVENOUS | Status: AC
  Administered 2020-06-12: 130 mg via INTRAVENOUS
  Filled 2020-06-12: qty 20

## 2020-06-12 MED ORDER — DEXTROSE 5 % IV SOLN
Freq: Once | INTRAVENOUS | Status: AC
Start: 2020-06-12 — End: 2020-06-12
  Filled 2020-06-12: qty 250

## 2020-06-12 MED ORDER — LEUCOVORIN CALCIUM INJECTION 350 MG
400.0000 mg/m2 | Freq: Once | INTRAMUSCULAR | Status: AC
  Administered 2020-06-12: 676 mg via INTRAVENOUS
  Filled 2020-06-12: qty 33.8

## 2020-06-12 MED ORDER — PALONOSETRON HCL INJECTION 0.25 MG/5ML
0.2500 mg | Freq: Once | INTRAVENOUS | Status: AC
  Administered 2020-06-12: 0.25 mg via INTRAVENOUS

## 2020-06-12 MED ORDER — SODIUM CHLORIDE 0.9 % IV SOLN
10.0000 mg | Freq: Once | INTRAVENOUS | Status: AC
  Administered 2020-06-12: 10 mg via INTRAVENOUS
  Filled 2020-06-12: qty 10

## 2020-06-12 MED ORDER — SODIUM CHLORIDE 0.9 % IV SOLN
2160.0000 mg/m2 | INTRAVENOUS | Status: DC
  Administered 2020-06-12: 3650 mg via INTRAVENOUS
  Filled 2020-06-12: qty 73

## 2020-06-12 MED ORDER — DEXTROSE 5 % IV SOLN
Freq: Once | INTRAVENOUS | Status: AC
  Filled 2020-06-12: qty 250

## 2020-06-12 NOTE — Progress Notes (Signed)
Hematology and Oncology Follow Up Visit  Dean Singh [id number] [birth year] 60 y.o. 06/12/2020   Principle Diagnosis:   Metastatic adenocarcinoma the pancreas-hepatic metastasis  Current Therapy:    FOLFIRINOX-s/p cycle #1 - start on 05/20/2020     Interim History:  Dean Singh is back for follow-up.  He is feeling better.  Her clinic on have to adjust the dose of chemotherapy with the FOLFIRINOX.  He just cannot take standard dose therapy.  I think the diarrhea really got him.  He said he felt a whole lot better after getting fluids and atropine.  He is eating better.  He has gained a little bit of weight.  He has not had mouth sores.  He has had no bleeding.  There is no cough.  His last CA 19-9 was little more elevated.  It is hard to know what this really means since he is only had 1 cycle of treatment.  The last CA 19-9 was on 105,500.  It be nice to try to get him on a regular schedule now.  If after this second cycle he has problems, we will have to make a change to gemcitabine/Abraxane.  Currently, I would have to say his performance status is probably ECOG 1.    Medications:  Current Outpatient Medications:  .  aspirin EC 81 MG tablet, Take 1 tablet (81 mg total) by mouth daily., Disp:  , Rfl:  .  cilostazol (PLETAL) 100 MG tablet, TAKE 1 TABLET BY MOUTH TWICE A DAY, Disp: 180 tablet, Rfl: 1 .  clopidogrel (PLAVIX) 75 MG tablet, TAKE 1 TABLET BY MOUTH EVERY DAY (Patient taking differently: Take 75 mg by mouth daily.), Disp: 90 tablet, Rfl: 3 .  Continuous Blood Gluc Receiver (FREESTYLE LIBRE 14 DAY READER) DEVI, Use Freestyle Libre meter to monitor blood sugars. DX:E11.9, Disp: 1 each, Rfl: 0 .  Continuous Blood Gluc Sensor (FREESTYLE LIBRE 14 DAY SENSOR) MISC, Apply one sensor once every 14 days to monitor blood sugars. DX:E11.9, Disp: 2 each, Rfl: 2 .  dronabinol (MARINOL) 5 MG capsule, Take 1 capsule (5 mg total) by mouth 2 (two) times daily before lunch and supper., Disp:  60 capsule, Rfl: 0 .  ezetimibe (ZETIA) 10 MG tablet, TAKE 1 TABLET BY MOUTH EVERY DAY (Patient taking differently: Take 10 mg by mouth daily.), Disp: 90 tablet, Rfl: 3 .  famotidine (PEPCID) 40 MG tablet, TAKE 1 TABLET BY MOUTH EVERY DAY, Disp: 90 tablet, Rfl: 1 .  FARXIGA 5 MG TABS tablet, TAKE 1 TABLET BY MOUTH DAILY BEFORE BREAKFAST., Disp: 30 tablet, Rfl: 2 .  fentaNYL (DURAGESIC) 50 MCG/HR, Place 1 patch onto the skin every 3 (three) days., Disp: 10 patch, Rfl: 0 .  glucose blood (ONETOUCH ULTRA) test strip, USE TWICE DAILY AS DIRECTED ( E11.65), Disp: 200 each, Rfl: 12 .  HYDROcodone-acetaminophen (NORCO) 5-325 MG tablet, Take 1-2 tablets by mouth every 6 (six) hours as needed for moderate pain or severe pain., Disp: 60 tablet, Rfl: 0 .  Insulin Glargine (BASAGLAR KWIKPEN) 100 UNIT/ML, Inject 20 Units into the skin daily., Disp: 30 mL, Rfl: 4 .  Insulin Pen Needle 32G X 4 MM MISC, 1 Device by Does not apply route daily., Disp: 50 each, Rfl: 6 .  JANUVIA 100 MG tablet, TAKE 1 TABLET BY MOUTH EVERY DAY, Disp: 90 tablet, Rfl: 1 .  lidocaine-prilocaine (EMLA) cream, Apply to affected area once, Disp: 30 g, Rfl: 3 .  loperamide (IMODIUM A-D) 2 MG tablet, Take 2  at onset of diarrhea, then 1 every 2hrs until 12hr without a BM. May take 2 tab every 4hrs at bedtime. If diarrhea recurs repeat., Disp: 100 tablet, Rfl: 1 .  metFORMIN (GLUCOPHAGE) 1000 MG tablet, Take 1 tablet (1,000 mg total) by mouth daily with breakfast., Disp: 90 tablet, Rfl: 3 .  nitroGLYCERIN (NITROSTAT) 0.4 MG SL tablet, Place 0.4 mg under the tongue every 5 (five) minutes as needed for chest pain., Disp: , Rfl:  .  ondansetron (ZOFRAN) 4 MG tablet, Take 1 tablet (4 mg total) by mouth every 8 (eight) hours as needed for nausea or vomiting., Disp: 40 tablet, Rfl: 0 .  ondansetron (ZOFRAN) 8 MG tablet, Take 1 tablet (8 mg total) by mouth 2 (two) times daily as needed. Start on day 3 after chemotherapy., Disp: 30 tablet, Rfl: 1 .   pregabalin (LYRICA) 300 MG capsule, TAKE 1 CAPSULE BY MOUTH EVERYDAY AT BEDTIME, Disp: 30 capsule, Rfl: 2 .  prochlorperazine (COMPAZINE) 10 MG tablet, Take 1 tablet (10 mg total) by mouth every 6 (six) hours as needed (Nausea or vomiting)., Disp: 30 tablet, Rfl: 1 .  rosuvastatin (CRESTOR) 5 MG tablet, TAKE 1 TABLET BY MOUTH EVERY DAY (Patient taking differently: Take 5 mg by mouth daily.), Disp: 90 tablet, Rfl: 0 .  Saw Palmetto, Serenoa repens, (SAW PALMETTO PO), Take 1 capsule by mouth daily as needed (When having prostate problems). , Disp: , Rfl:  .  SUMAtriptan (IMITREX) 25 MG tablet, Take 1 tablet (25 mg total) by mouth every 2 (two) hours as needed for migraine., Disp: 30 tablet, Rfl: 0 .  Wheat Dextrin (BENEFIBER PO), Take 1 Scoop by mouth daily., Disp: , Rfl:  .  loperamide (IMODIUM) 2 MG capsule, Take 4 mg by mouth 2 (two) times daily., Disp: , Rfl:  .  pantoprazole (PROTONIX) 40 MG tablet, Take 1 tablet (40 mg total) by mouth daily. (Patient not taking: Reported on 06/12/2020), Disp: 90 tablet, Rfl: 3 .  sucralfate (CARAFATE) 1 GM/10ML suspension, SMARTSIG:Milliliter(s) By Mouth, Disp: , Rfl:  No current facility-administered medications for this visit.  Facility-Administered Medications Ordered in Other Visits:  .  fluorouracil (ADRUCIL) 3,650 mg in sodium chloride 0.9 % 77 mL chemo infusion, 2,160 mg/m2 (Treatment Plan Recorded), Intravenous, 1 day or 1 dose, Volanda Napoleon, MD, 3,650 mg at 06/12/20 1521  Allergies:  Allergies  Allergen Reactions  . Statins Itching    Atorvastatin cause "my skin to feel horrible and unbearable".  . Morphine Nausea And Vomiting    "felt like chest was exploding" pt reports     Past Medical History, Surgical history, Social history, and Family History were reviewed and updated.  Review of Systems: Review of Systems  Constitutional: Positive for fatigue.  HENT:  Negative.   Eyes: Negative.   Respiratory: Negative.   Cardiovascular:  Negative.   Gastrointestinal: Positive for abdominal pain.  Endocrine: Negative.   Musculoskeletal: Positive for arthralgias, flank pain and myalgias.  Skin: Negative.   Neurological: Negative.   Hematological: Negative.   Psychiatric/Behavioral: Negative.     Physical Exam:  vitals were not taken for this visit.   Wt Readings from Last 3 Encounters:  06/12/20 62.1 kg  06/04/20 60.8 kg  05/15/20 64 kg    Physical Exam Vitals reviewed.  HENT:     Head: Normocephalic and atraumatic.  Eyes:     Pupils: Pupils are equal, round, and reactive to light.  Cardiovascular:     Rate and Rhythm: Normal rate and  regular rhythm.     Heart sounds: Normal heart sounds.  Pulmonary:     Effort: Pulmonary effort is normal.     Breath sounds: Normal breath sounds.  Abdominal:     General: Bowel sounds are normal.     Palpations: Abdomen is soft.  Musculoskeletal:        General: No tenderness or deformity. Normal range of motion.     Cervical back: Normal range of motion.  Lymphadenopathy:     Cervical: No cervical adenopathy.  Skin:    General: Skin is warm and dry.     Findings: No erythema or rash.  Neurological:     Mental Status: He is alert and oriented to person, place, and time.  Psychiatric:        Behavior: Behavior normal.        Thought Content: Thought content normal.        Judgment: Judgment normal.      Lab Results  Component Value Date   WBC 7.4 06/12/2020   HGB 13.9 06/12/2020   HCT 40.7 06/12/2020   MCV 89.6 06/12/2020   PLT 151 06/12/2020     Chemistry      Component Value Date/Time   NA 136 06/12/2020 0940   NA 132 (L) 05/01/2019 1108   K 4.2 06/12/2020 0940   CL 101 06/12/2020 0940   CO2 28 06/12/2020 0940   BUN 15 06/12/2020 0940   BUN 18 05/01/2019 1108   CREATININE 0.78 06/12/2020 0940      Component Value Date/Time   CALCIUM 9.8 06/12/2020 0940   ALKPHOS 561 (H) 06/12/2020 0940   AST 46 (H) 06/12/2020 0940   ALT 56 (H) 06/12/2020 0940    BILITOT 0.7 06/12/2020 0940      Impression and Plan: Dean Singh is a very nice 60 year old white male.  He has metastatic adenocarcinoma the pancreas.  He has extensive liver metastasis.  Again, we will proceed with chemotherapy.  It might be worthwhile having him come in next week for IV fluids just to be on the safe side.  Hopefully, atropine will help with the diarrhea.  I just feel bad that he has had a hard time.  I certainly do not see his quality of life getting better because of the past treatment.  I would like to think that we can improve his quality of life by making a dosage adjustment.  I know that he is trying hard.  He has great support from his family.  We will see about get him back in a couple of weeks or so and then going for treatment.  I do think that the CNA's [place] will tell us how things are going.    Volanda Napoleon, MD 6/8/20225:35 PM

## 2020-06-12 NOTE — Progress Notes (Signed)
Per infusion nurse, patient feels "1000 times better than last week". He is ready to start his next, dose reduced, cycle.   Oncology Nurse Navigator Documentation  Oncology Nurse Navigator Flowsheets 06/12/2020  Abnormal Finding Date -  Confirmed Diagnosis Date -  Diagnosis Status -  Planned Course of Treatment -  Phase of Treatment -  Chemotherapy Actual Start Date: -  Navigator Follow Up Date: 06/25/2020  Navigator Follow Up Reason: Follow-up Appointment  Navigator Location [facility]  Referral Date to RadOnc/MedOnc -  Navigator Encounter Type Appt/Treatment Plan Review  Telephone -  Treatment Initiated Date -  Patient Visit Type -  Treatment Phase Active Tx  Barriers/Navigation Needs Coordination of Care;Education  Education -  Interventions None Required  Acuity Level 2-Minimal Needs (1-2 Barriers Identified)  Coordination of Care -  Education Method -  Support Groups/Services Friends and Family  Time Spent with Patient 15

## 2020-06-12 NOTE — Patient Instructions (Signed)
Fluorouracil, 5-FU injection What is this medicine? FLUOROURACIL, 5-FU (flure oh YOOR a sil) is a chemotherapy drug. It slows the growth of cancer cells. This medicine is used to treat many types of cancer like breast cancer, colon or rectal cancer, pancreatic cancer, and stomach cancer. This medicine may be used for other purposes; ask your health care provider or pharmacist if you have questions. COMMON BRAND NAME(S): Adrucil What should I tell my health care provider before I take this medicine? They need to know if you have any of these conditions:  blood disorders  dihydropyrimidine dehydrogenase (DPD) deficiency  infection (especially a virus infection such as chickenpox, cold sores, or herpes)  kidney disease  liver disease  malnourished, poor nutrition  recent or ongoing radiation therapy  an unusual or allergic reaction to fluorouracil, other chemotherapy, other medicines, foods, dyes, or preservatives  pregnant or trying to get pregnant  breast-feeding How should I use this medicine? This drug is given as an infusion or injection into a vein. It is administered in a hospital or clinic by a specially trained health care professional. Talk to your pediatrician regarding the use of this medicine in children. Special care may be needed. Overdosage: If you think you have taken too much of this medicine contact a poison control center or emergency room at once. NOTE: This medicine is only for you. Do not share this medicine with others. What if I miss a dose? It is important not to miss your dose. Call your doctor or health care professional if you are unable to keep an appointment. What may interact with this medicine? Do not take this medicine with any of the following medications:  live virus vaccines This medicine may also interact with the following medications:  medicines that treat or prevent blood clots like warfarin, enoxaparin, and dalteparin This list may not  describe all possible interactions. Give your health care provider a list of all the medicines, herbs, non-prescription drugs, or dietary supplements you use. Also tell them if you smoke, drink alcohol, or use illegal drugs. Some items may interact with your medicine. What should I watch for while using this medicine? Visit your doctor for checks on your progress. This drug may make you feel generally unwell. This is not uncommon, as chemotherapy can affect healthy cells as well as cancer cells. Report any side effects. Continue your course of treatment even though you feel ill unless your doctor tells you to stop. In some cases, you may be given additional medicines to help with side effects. Follow all directions for their use. Call your doctor or health care professional for advice if you get a fever, chills or sore throat, or other symptoms of a cold or flu. Do not treat yourself. This drug decreases your body's ability to fight infections. Try to avoid being around people who are sick. This medicine may increase your risk to bruise or bleed. Call your doctor or health care professional if you notice any unusual bleeding. Be careful brushing and flossing your teeth or using a toothpick because you may get an infection or bleed more easily. If you have any dental work done, tell your dentist you are receiving this medicine. Avoid taking products that contain aspirin, acetaminophen, ibuprofen, naproxen, or ketoprofen unless instructed by your doctor. These medicines may hide a fever. Do not become pregnant while taking this medicine. Women should inform their doctor if they wish to become pregnant or think they might be pregnant. There is a potential   for serious side effects to an unborn child. Talk to your health care professional or pharmacist for more information. Do not breast-feed an infant while taking this medicine. Men should inform their doctor if they wish to father a child. This medicine may  lower sperm counts. Do not treat diarrhea with over the counter products. Contact your doctor if you have diarrhea that lasts more than 2 days or if it is severe and watery. This medicine can make you more sensitive to the sun. Keep out of the sun. If you cannot avoid being in the sun, wear protective clothing and use sunscreen. Do not use sun lamps or tanning beds/booths. What side effects may I notice from receiving this medicine? Side effects that you should report to your doctor or health care professional as soon as possible:  allergic reactions like skin rash, itching or hives, swelling of the face, lips, or tongue  low blood counts - this medicine may decrease the number of white blood cells, red blood cells and platelets. You may be at increased risk for infections and bleeding.  signs of infection - fever or chills, cough, sore throat, pain or difficulty passing urine  signs of decreased platelets or bleeding - bruising, pinpoint red spots on the skin, black, tarry stools, blood in the urine  signs of decreased red blood cells - unusually weak or tired, fainting spells, lightheadedness  breathing problems  changes in vision  chest pain  mouth sores  nausea and vomiting  pain, swelling, redness at site where injected  pain, tingling, numbness in the hands or feet  redness, swelling, or sores on hands or feet  stomach pain  unusual bleeding Side effects that usually do not require medical attention (report to your doctor or health care professional if they continue or are bothersome):  changes in finger or toe nails  diarrhea  dry or itchy skin  hair loss  headache  loss of appetite  sensitivity of eyes to the light  stomach upset  unusually teary eyes This list may not describe all possible side effects. Call your doctor for medical advice about side effects. You may report side effects to FDA at 1-800-FDA-1088. Where should I keep my medicine? This  drug is given in a hospital or clinic and will not be stored at home. NOTE: This sheet is a summary. It may not cover all possible information. If you have questions about this medicine, talk to your doctor, pharmacist, or health care provider.  2021 [employer] Standard (2018-11-22 15:00:03) Irinotecan injection What is this medicine? IRINOTECAN (ir in oh TEE kan ) is a chemotherapy drug. It is used to treat colon and rectal cancer. This medicine may be used for other purposes; ask your health care provider or pharmacist if you have questions. COMMON BRAND NAME(S): Camptosar What should I tell my health care provider before I take this medicine? They need to know if you have any of these conditions:  dehydration  diarrhea  infection (especially a virus infection such as chickenpox, cold sores, or herpes)  liver disease  low blood counts, like low white cell, platelet, or red cell counts  low levels of calcium, magnesium, or potassium in the blood  recent or ongoing radiation therapy  an unusual or allergic reaction to irinotecan, other medicines, foods, dyes, or preservatives  pregnant or trying to get pregnant  breast-feeding How should I use this medicine? This drug is given as an infusion into a vein. It is administered in a  hospital or clinic by a specially trained health care professional. Talk to your pediatrician regarding the use of this medicine in children. Special care may be needed. Overdosage: If you think you have taken too much of this medicine contact a poison control center or emergency room at once. NOTE: This medicine is only for you. Do not share this medicine with others. What if I miss a dose? It is important not to miss your dose. Call your doctor or health care professional if you are unable to keep an appointment. What may interact with this medicine? Do not take this medicine with any of the following medications:  cobicistat  itraconazole This  medicine may interact with the following medications:  antiviral medicines for HIV or AIDS  certain antibiotics like rifampin or rifabutin  certain medicines for fungal infections like ketoconazole, posaconazole, and voriconazole  certain medicines for seizures like carbamazepine, phenobarbital, phenotoin  clarithromycin  gemfibrozil  nefazodone  St. John's Wort This list may not describe all possible interactions. Give your health care provider a list of all the medicines, herbs, non-prescription drugs, or dietary supplements you use. Also tell them if you smoke, drink alcohol, or use illegal drugs. Some items may interact with your medicine. What should I watch for while using this medicine? Your condition will be monitored carefully while you are receiving this medicine. You will need important blood work done while you are taking this medicine. This drug may make you feel generally unwell. This is not uncommon, as chemotherapy can affect healthy cells as well as cancer cells. Report any side effects. Continue your course of treatment even though you feel ill unless your doctor tells you to stop. In some cases, you may be given additional medicines to help with side effects. Follow all directions for their use. You may get drowsy or dizzy. Do not drive, use machinery, or do anything that needs mental alertness until you know how this medicine affects you. Do not stand or sit up quickly, especially if you are an older patient. This reduces the risk of dizzy or fainting spells. Call your health care professional for advice if you get a fever, chills, or sore throat, or other symptoms of a cold or flu. Do not treat yourself. This medicine decreases your body's ability to fight infections. Try to avoid being around people who are sick. Avoid taking products that contain aspirin, acetaminophen, ibuprofen, naproxen, or ketoprofen unless instructed by your doctor. These medicines may hide a  fever. This medicine may increase your risk to bruise or bleed. Call your doctor or health care professional if you notice any unusual bleeding. Be careful brushing and flossing your teeth or using a toothpick because you may get an infection or bleed more easily. If you have any dental work done, tell your dentist you are receiving this medicine. Do not become pregnant while taking this medicine or for 6 months after stopping it. Women should inform their health care professional if they wish to become pregnant or think they might be pregnant. Men should not father a child while taking this medicine and for 3 months after stopping it. There is potential for serious side effects to an unborn child. Talk to your health care professional for more information. Do not breast-feed an infant while taking this medicine or for 7 days after stopping it. This medicine has caused ovarian failure in some women. This medicine may make it more difficult to get pregnant. Talk to your health care professional if  you are concerned about your fertility. This medicine has caused decreased sperm counts in some men. This may make it more difficult to father a child. Talk to your health care professional if you are concerned about your fertility. What side effects may I notice from receiving this medicine? Side effects that you should report to your doctor or health care professional as soon as possible:  allergic reactions like skin rash, itching or hives, swelling of the face, lips, or tongue  chest pain  diarrhea  flushing, runny nose, sweating during infusion  low blood counts - this medicine may decrease the number of white blood cells, red blood cells and platelets. You may be at increased risk for infections and bleeding.  nausea, vomiting  pain, swelling, warmth in the leg  signs of decreased platelets or bleeding - bruising, pinpoint red spots on the skin, black, tarry stools, blood in the urine  signs  of infection - fever or chills, cough, sore throat, pain or difficulty passing urine  signs of decreased red blood cells - unusually weak or tired, fainting spells, lightheadedness Side effects that usually do not require medical attention (report to your doctor or health care professional if they continue or are bothersome):  constipation  hair loss  headache  loss of appetite  mouth sores  stomach pain This list may not describe all possible side effects. Call your doctor for medical advice about side effects. You may report side effects to FDA at 1-800-FDA-1088. Where should I keep my medicine? This drug is given in a hospital or clinic and will not be stored at home. NOTE: This sheet is a summary. It may not cover all possible information. If you have questions about this medicine, talk to your doctor, pharmacist, or health care provider.  2021 [employer]/Gold Standard (2018-11-22 17:46:13) Leucovorin injection What is this medicine? LEUCOVORIN (loo koe VOR in) is used to prevent or treat the harmful effects of some medicines. This medicine is used to treat anemia caused by a low amount of folic acid in the body. It is also used with 5-fluorouracil (5-FU) to treat colon cancer. This medicine may be used for other purposes; ask your health care provider or pharmacist if you have questions. What should I tell my health care provider before I take this medicine? They need to know if you have any of these conditions:  anemia from low levels of vitamin B-12 in the blood  an unusual or allergic reaction to leucovorin, folic acid, other medicines, foods, dyes, or preservatives  pregnant or trying to get pregnant  breast-feeding How should I use this medicine? This medicine is for injection into a muscle or into a vein. It is given by a health care professional in a hospital or clinic setting. Talk to your pediatrician regarding the use of this medicine in children. Special care may  be needed. Overdosage: If you think you have taken too much of this medicine contact a poison control center or emergency room at once. NOTE: This medicine is only for you. Do not share this medicine with others. What if I miss a dose? This does not apply. What may interact with this medicine?  capecitabine  fluorouracil  phenobarbital  phenytoin  primidone  trimethoprim-sulfamethoxazole This list may not describe all possible interactions. Give your health care provider a list of all the medicines, herbs, non-prescription drugs, or dietary supplements you use. Also tell them if you smoke, drink alcohol, or use illegal drugs. Some items may interact  with your medicine. What should I watch for while using this medicine? Your condition will be monitored carefully while you are receiving this medicine. This medicine may increase the side effects of 5-fluorouracil, 5-FU. Tell your doctor or health care professional if you have diarrhea or mouth sores that do not get better or that get worse. What side effects may I notice from receiving this medicine? Side effects that you should report to your doctor or health care professional as soon as possible:  allergic reactions like skin rash, itching or hives, swelling of the face, lips, or tongue  breathing problems  fever, infection  mouth sores  unusual bleeding or bruising  unusually weak or tired Side effects that usually do not require medical attention (report to your doctor or health care professional if they continue or are bothersome):  constipation or diarrhea  loss of appetite  nausea, vomiting This list may not describe all possible side effects. Call your doctor for medical advice about side effects. You may report side effects to FDA at 1-800-FDA-1088. Where should I keep my medicine? This drug is given in a hospital or clinic and will not be stored at home. NOTE: This sheet is a summary. It may not cover all possible  information. If you have questions about this medicine, talk to your doctor, pharmacist, or health care provider.  2021 [employer]/Gold Standard (2007-06-28 16:50:29) Oxaliplatin Injection What is this medicine? OXALIPLATIN (ox AL i PLA tin) is a chemotherapy drug. It targets fast dividing cells, like cancer cells, and causes these cells to die. This medicine is used to treat cancers of the colon and rectum, and many other cancers. This medicine may be used for other purposes; ask your health care provider or pharmacist if you have questions. COMMON BRAND NAME(S): Eloxatin What should I tell my health care provider before I take this medicine? They need to know if you have any of these conditions:  heart disease  history of irregular heartbeat  liver disease  low blood counts, like white cells, platelets, or red blood cells  lung or breathing disease, like asthma  take medicines that treat or prevent blood clots  tingling of the fingers or toes, or other nerve disorder  an unusual or allergic reaction to oxaliplatin, other chemotherapy, other medicines, foods, dyes, or preservatives  pregnant or trying to get pregnant  breast-feeding How should I use this medicine? This drug is given as an infusion into a vein. It is administered in a hospital or clinic by a specially trained health care professional. Talk to your pediatrician regarding the use of this medicine in children. Special care may be needed. Overdosage: If you think you have taken too much of this medicine contact a poison control center or emergency room at once. NOTE: This medicine is only for you. Do not share this medicine with others. What if I miss a dose? It is important not to miss a dose. Call your doctor or health care professional if you are unable to keep an appointment. What may interact with this medicine? Do not take this medicine with any of the following  medications:  cisapride  dronedarone  pimozide  thioridazine This medicine may also interact with the following medications:  aspirin and aspirin-like medicines  certain medicines that treat or prevent blood clots like warfarin, apixaban, dabigatran, and rivaroxaban  cisplatin  cyclosporine  diuretics  medicines for infection like acyclovir, adefovir, amphotericin B, bacitracin, cidofovir, foscarnet, ganciclovir, gentamicin, pentamidine, vancomycin  NSAIDs, medicines for  pain and inflammation, like ibuprofen or naproxen  other medicines that prolong the QT interval (an abnormal heart rhythm)  pamidronate  zoledronic acid This list may not describe all possible interactions. Give your health care provider a list of all the medicines, herbs, non-prescription drugs, or dietary supplements you use. Also tell them if you smoke, drink alcohol, or use illegal drugs. Some items may interact with your medicine. What should I watch for while using this medicine? Your condition will be monitored carefully while you are receiving this medicine. You may need blood work done while you are taking this medicine. This medicine may make you feel generally unwell. This is not uncommon as chemotherapy can affect healthy cells as well as cancer cells. Report any side effects. Continue your course of treatment even though you feel ill unless your healthcare professional tells you to stop. This medicine can make you more sensitive to cold. Do not drink cold drinks or use ice. Cover exposed skin before coming in contact with cold temperatures or cold objects. When out in cold weather wear warm clothing and cover your mouth and nose to warm the air that goes into your lungs. Tell your doctor if you get sensitive to the cold. Do not become pregnant while taking this medicine or for 9 months after stopping it. Women should inform their health care professional if they wish to become pregnant or think they  might be pregnant. Men should not father a child while taking this medicine and for 6 months after stopping it. There is potential for serious side effects to an unborn child. Talk to your health care professional for more information. Do not breast-feed a child while taking this medicine or for 3 months after stopping it. This medicine has caused ovarian failure in some women. This medicine may make it more difficult to get pregnant. Talk to your health care professional if you are concerned about your fertility. This medicine has caused decreased sperm counts in some men. This may make it more difficult to father a child. Talk to your health care professional if you are concerned about your fertility. This medicine may increase your risk of getting an infection. Call your health care professional for advice if you get a fever, chills, or sore throat, or other symptoms of a cold or flu. Do not treat yourself. Try to avoid being around people who are sick. Avoid taking medicines that contain aspirin, acetaminophen, ibuprofen, naproxen, or ketoprofen unless instructed by your health care professional. These medicines may hide a fever. Be careful brushing or flossing your teeth or using a toothpick because you may get an infection or bleed more easily. If you have any dental work done, tell your dentist you are receiving this medicine. What side effects may I notice from receiving this medicine? Side effects that you should report to your doctor or health care professional as soon as possible:  allergic reactions like skin rash, itching or hives, swelling of the face, lips, or tongue  breathing problems  cough  low blood counts - this medicine may decrease the number of white blood cells, red blood cells, and platelets. You may be at increased risk for infections and bleeding  nausea, vomiting  pain, redness, or irritation at site where injected  pain, tingling, numbness in the hands or  feet  signs and symptoms of bleeding such as bloody or black, tarry stools; red or dark brown urine; spitting up blood or brown material that looks like coffee grounds;  red spots on the skin; unusual bruising or bleeding from the eyes, gums, or nose  signs and symptoms of a dangerous change in heartbeat or heart rhythm like chest pain; dizziness; fast, irregular heartbeat; palpitations; feeling faint or lightheaded; falls  signs and symptoms of infection like fever; chills; cough; sore throat; pain or trouble passing urine  signs and symptoms of liver injury like dark yellow or brown urine; general ill feeling or flu-like symptoms; light-colored stools; loss of appetite; nausea; right upper belly pain; unusually weak or tired; yellowing of the eyes or skin  signs and symptoms of low red blood cells or anemia such as unusually weak or tired; feeling faint or lightheaded; falls  signs and symptoms of muscle injury like dark urine; trouble passing urine or change in the amount of urine; unusually weak or tired; muscle pain; back pain Side effects that usually do not require medical attention (report to your doctor or health care professional if they continue or are bothersome):  changes in taste  diarrhea  gas  hair loss  loss of appetite  mouth sores This list may not describe all possible side effects. Call your doctor for medical advice about side effects. You may report side effects to FDA at 1-800-FDA-1088. Where should I keep my medicine? This drug is given in a hospital or clinic and will not be stored at home. NOTE: This sheet is a summary. It may not cover all possible information. If you have questions about this medicine, talk to your doctor, pharmacist, or health care provider.  2021 [employer] Standard (2018-05-11 12:20:35)

## 2020-06-12 NOTE — Patient Instructions (Signed)
Implanted Port Insertion, Care After This sheet gives you information about how to care for yourself after your procedure. Your health care provider may also give you more specific instructions. If you have problems or questions, contact your health care provider. What can I expect after the procedure? After the procedure, it is common to have:  Discomfort at the port insertion site.  Bruising on the skin over the port. This should improve over 3-4 days. Follow these instructions at home: Port care  After your port is placed, you will get a manufacturer's information card. The card has information about your port. Keep this card with you at all times.  Take care of the port as told by your health care provider. Ask your health care provider if you or a family member can get training for taking care of the port at home. A home health care nurse may also take care of the port.  Make sure to remember what type of port you have. Incision care  Follow instructions from your health care provider about how to take care of your port insertion site. Make sure you: ? Wash your hands with soap and water before and after you change your bandage (dressing). If soap and water are not available, use hand sanitizer. ? Change your dressing as told by your health care provider. ? Leave stitches (sutures), skin glue, or adhesive strips in place. These skin closures may need to stay in place for 2 weeks or longer. If adhesive strip edges start to loosen and curl up, you may trim the loose edges. Do not remove adhesive strips completely unless your health care provider tells you to do that.  Check your port insertion site every day for signs of infection. Check for: ? Redness, swelling, or pain. ? Fluid or blood. ? Warmth. ? Pus or a bad smell.      Activity  Return to your normal activities as told by your health care provider. Ask your health care provider what activities are safe for you.  Do not  lift anything that is heavier than 10 lb (4.5 kg), or the limit that you are told, until your health care provider says that it is safe. General instructions  Take over-the-counter and prescription medicines only as told by your health care provider.  Do not take baths, swim, or use a hot tub until your health care provider approves. Ask your health care provider if you may take showers. You may only be allowed to take sponge baths.  Do not drive for 24 hours if you were given a sedative during your procedure.  Wear a medical alert bracelet in case of an emergency. This will tell any health care providers that you have a port.  Keep all follow-up visits as told by your health care provider. This is important. Contact a health care provider if:  You cannot flush your port with saline as directed, or you cannot draw blood from the port.  You have a fever or chills.  You have redness, swelling, or pain around your port insertion site.  You have fluid or blood coming from your port insertion site.  Your port insertion site feels warm to the touch.  You have pus or a bad smell coming from the port insertion site. Get help right away if:  You have chest pain or shortness of breath.  You have bleeding from your port that you cannot control. Summary  Take care of the port as told by your   health care provider. Keep the manufacturer's information card with you at all times.  Change your dressing as told by your health care provider.  Contact a health care provider if you have a fever or chills or if you have redness, swelling, or pain around your port insertion site.  Keep all follow-up visits as told by your health care provider. This information is not intended to replace advice given to you by your health care provider. Make sure you discuss any questions you have with your health care provider. Document Revised: 07/20/2017 Document Reviewed: 07/20/2017 Elsevier Patient Education   2021 Elsevier Inc.  

## 2020-06-13 ENCOUNTER — Telehealth: Payer: Self-pay

## 2020-06-13 ENCOUNTER — Encounter: Payer: Self-pay | Admitting: *Deleted

## 2020-06-13 LAB — CANCER ANTIGEN 19-9: CA 19-9: 98947 U/mL — ABNORMAL HIGH (ref 0–35)

## 2020-06-13 NOTE — Telephone Encounter (Signed)
S/w pt per 06/12/20 los and he is aware of his appts and will get a new sch at his pump d.c 6/10    Dean Singh

## 2020-06-14 ENCOUNTER — Inpatient Hospital Stay: Payer: Medicare Other

## 2020-06-14 ENCOUNTER — Other Ambulatory Visit: Payer: Self-pay

## 2020-06-14 VITALS — BP 127/70 | HR 73 | Temp 98.1°F | Resp 17

## 2020-06-14 DIAGNOSIS — C787 Secondary malignant neoplasm of liver and intrahepatic bile duct: Secondary | ICD-10-CM

## 2020-06-14 DIAGNOSIS — Z5111 Encounter for antineoplastic chemotherapy: Secondary | ICD-10-CM | POA: Diagnosis not present

## 2020-06-14 MED ORDER — SODIUM CHLORIDE 0.9% FLUSH
10.0000 mL | INTRAVENOUS | Status: DC | PRN
  Administered 2020-06-14: 10 mL
  Filled 2020-06-14: qty 10

## 2020-06-14 MED ORDER — HEPARIN SOD (PORK) LOCK FLUSH 100 UNIT/ML IV SOLN
500.0000 [IU] | Freq: Once | INTRAVENOUS | Status: AC | PRN
  Administered 2020-06-14: 500 [IU]
  Filled 2020-06-14: qty 5

## 2020-06-14 NOTE — Patient Instructions (Signed)
Fluorouracil, 5-FU injection What is this medication? FLUOROURACIL, 5-FU (flure oh YOOR a sil) is a chemotherapy drug. It slows the growth of cancer cells. This medicine is used to treat many types of cancer like breast cancer, colon or rectal cancer, pancreatic cancer, and stomach cancer. This medicine may be used for other purposes; ask your health care provider or pharmacist if you have questions. COMMON BRAND NAME(S): Adrucil What should I tell my care team before I take this medication? They need to know if you have any of these conditions: blood disorders dihydropyrimidine dehydrogenase (DPD) deficiency infection (especially a virus infection such as chickenpox, cold sores, or herpes) kidney disease liver disease malnourished, poor nutrition recent or ongoing radiation therapy an unusual or allergic reaction to fluorouracil, other chemotherapy, other medicines, foods, dyes, or preservatives pregnant or trying to get pregnant breast-feeding How should I use this medication? This drug is given as an infusion or injection into a vein. It is administered in a hospital or clinic by a specially trained health care professional. Talk to your pediatrician regarding the use of this medicine in children. Special care may be needed. Overdosage: If you think you have taken too much of this medicine contact a poison control center or emergency room at once. NOTE: This medicine is only for you. Do not share this medicine with others. What if I miss a dose? It is important not to miss your dose. Call your doctor or health care professional if you are unable to keep an appointment. What may interact with this medication? Do not take this medicine with any of the following medications: live virus vaccines This medicine may also interact with the following medications: medicines that treat or prevent blood clots like warfarin, enoxaparin, and dalteparin This list may not describe all possible  interactions. Give your health care provider a list of all the medicines, herbs, non-prescription drugs, or dietary supplements you use. Also tell them if you smoke, drink alcohol, or use illegal drugs. Some items may interact with your medicine. What should I watch for while using this medication? Visit your doctor for checks on your progress. This drug may make you feel generally unwell. This is not uncommon, as chemotherapy can affect healthy cells as well as cancer cells. Report any side effects. Continue your course of treatment even though you feel ill unless your doctor tells you to stop. In some cases, you may be given additional medicines to help with side effects. Follow all directions for their use. Call your doctor or health care professional for advice if you get a fever, chills or sore throat, or other symptoms of a cold or flu. Do not treat yourself. This drug decreases your body's ability to fight infections. Try to avoid being around people who are sick. This medicine may increase your risk to bruise or bleed. Call your doctor or health care professional if you notice any unusual bleeding. Be careful brushing and flossing your teeth or using a toothpick because you may get an infection or bleed more easily. If you have any dental work done, tell your dentist you are receiving this medicine. Avoid taking products that contain aspirin, acetaminophen, ibuprofen, naproxen, or ketoprofen unless instructed by your doctor. These medicines may hide a fever. Do not become pregnant while taking this medicine. Women should inform their doctor if they wish to become pregnant or think they might be pregnant. There is a potential for serious side effects to an unborn child. Talk to your health care   professional or pharmacist for more information. Do not breast-feed an infant while taking this medicine. Men should inform their doctor if they wish to father a child. This medicine may lower sperm  counts. Do not treat diarrhea with over the counter products. Contact your doctor if you have diarrhea that lasts more than 2 days or if it is severe and watery. This medicine can make you more sensitive to the sun. Keep out of the sun. If you cannot avoid being in the sun, wear protective clothing and use sunscreen. Do not use sun lamps or tanning beds/booths. What side effects may I notice from receiving this medication? Side effects that you should report to your doctor or health care professional as soon as possible: allergic reactions like skin rash, itching or hives, swelling of the face, lips, or tongue low blood counts - this medicine may decrease the number of white blood cells, red blood cells and platelets. You may be at increased risk for infections and bleeding. signs of infection - fever or chills, cough, sore throat, pain or difficulty passing urine signs of decreased platelets or bleeding - bruising, pinpoint red spots on the skin, black, tarry stools, blood in the urine signs of decreased red blood cells - unusually weak or tired, fainting spells, lightheadedness breathing problems changes in vision chest pain mouth sores nausea and vomiting pain, swelling, redness at site where injected pain, tingling, numbness in the hands or feet redness, swelling, or sores on hands or feet stomach pain unusual bleeding Side effects that usually do not require medical attention (report to your doctor or health care professional if they continue or are bothersome): changes in finger or toe nails diarrhea dry or itchy skin hair loss headache loss of appetite sensitivity of eyes to the light stomach upset unusually teary eyes This list may not describe all possible side effects. Call your doctor for medical advice about side effects. You may report side effects to FDA at 1-800-FDA-1088. Where should I keep my medication? This drug is given in a hospital or clinic and will not be  stored at home. NOTE: This sheet is a summary. It may not cover all possible information. If you have questions about this medicine, talk to your doctor, pharmacist, or health care provider.  2022 Elsevier/Gold Standard (2018-11-22 15:00:03)  

## 2020-06-18 ENCOUNTER — Encounter: Payer: Self-pay | Admitting: Family Medicine

## 2020-06-18 ENCOUNTER — Encounter: Payer: Self-pay | Admitting: Hematology & Oncology

## 2020-06-19 ENCOUNTER — Ambulatory Visit: Payer: Medicaid Other

## 2020-06-19 ENCOUNTER — Other Ambulatory Visit: Payer: Medicaid Other

## 2020-06-25 ENCOUNTER — Other Ambulatory Visit: Payer: Medicaid Other

## 2020-06-25 ENCOUNTER — Ambulatory Visit: Payer: Medicaid Other | Admitting: Family

## 2020-06-25 ENCOUNTER — Ambulatory Visit: Payer: Medicaid Other

## 2020-06-25 ENCOUNTER — Encounter: Payer: Self-pay | Admitting: *Deleted

## 2020-06-25 NOTE — Progress Notes (Signed)
Patient follow up appointment moved out one additional week.  Oncology Nurse Navigator Documentation  Oncology Nurse Navigator Flowsheets 06/25/2020  Abnormal Finding Date -  Confirmed Diagnosis Date -  Diagnosis Status -  Planned Course of Treatment -  Phase of Treatment -  Chemotherapy Actual Start Date: -  Navigator Follow Up Date: 07/02/2020  Navigator Follow Up Reason: Follow-up Appointment;Chemotherapy  Navigator Location [facility]  Referral Date to RadOnc/MedOnc -  Navigator Encounter Type Appt/Treatment Plan Review  Telephone -  Treatment Initiated Date -  Patient Visit Type MedOnc  Treatment Phase Active Tx  Barriers/Navigation Needs Coordination of Care;Education  Education -  Interventions None Required  Acuity Level 2-Minimal Needs (1-2 Barriers Identified)  Coordination of Care -  Education Method -  Support Groups/Services Friends and Family  Time Spent with Patient 15

## 2020-06-27 ENCOUNTER — Other Ambulatory Visit: Payer: Self-pay | Admitting: Family Medicine

## 2020-07-02 ENCOUNTER — Inpatient Hospital Stay: Payer: Medicare Other

## 2020-07-02 ENCOUNTER — Inpatient Hospital Stay (HOSPITAL_BASED_OUTPATIENT_CLINIC_OR_DEPARTMENT_OTHER): Payer: Medicare Other | Admitting: Family

## 2020-07-02 ENCOUNTER — Encounter: Payer: Self-pay | Admitting: Family

## 2020-07-02 ENCOUNTER — Encounter: Payer: Self-pay | Admitting: *Deleted

## 2020-07-02 VITALS — BP 140/68 | HR 71 | Temp 97.7°F | Resp 17 | Ht 63.0 in | Wt 134.0 lb

## 2020-07-02 DIAGNOSIS — C787 Secondary malignant neoplasm of liver and intrahepatic bile duct: Secondary | ICD-10-CM

## 2020-07-02 DIAGNOSIS — C259 Malignant neoplasm of pancreas, unspecified: Secondary | ICD-10-CM

## 2020-07-02 DIAGNOSIS — Z5111 Encounter for antineoplastic chemotherapy: Secondary | ICD-10-CM | POA: Diagnosis not present

## 2020-07-02 LAB — CMP (CANCER CENTER ONLY)
ALT: 67 U/L — ABNORMAL HIGH (ref 0–44)
AST: 54 U/L — ABNORMAL HIGH (ref 15–41)
Albumin: 4.4 g/dL (ref 3.5–5.0)
Alkaline Phosphatase: 725 U/L — ABNORMAL HIGH (ref 38–126)
Anion gap: 9 (ref 5–15)
BUN: 15 mg/dL (ref 6–20)
CO2: 28 mmol/L (ref 22–32)
Calcium: 10.2 mg/dL (ref 8.9–10.3)
Chloride: 96 mmol/L — ABNORMAL LOW (ref 98–111)
Creatinine: 0.61 mg/dL (ref 0.61–1.24)
GFR, Estimated: >60 mL/min (ref >60)
Glucose, Bld: 369 mg/dL — ABNORMAL HIGH (ref 70–99)
Potassium: 4.7 mmol/L (ref 3.5–5.1)
Sodium: 133 mmol/L — ABNORMAL LOW (ref 135–145)
Total Bilirubin: 1.1 mg/dL (ref 0.3–1.2)
Total Protein: 7.6 g/dL (ref 6.5–8.1)

## 2020-07-02 LAB — CBC WITH DIFFERENTIAL (CANCER CENTER ONLY)
Abs Immature Granulocytes: 0.03 10*3/uL (ref 0.00–0.07)
Basophils Absolute: 0.1 10*3/uL (ref 0.0–0.1)
Basophils Relative: 1 %
Eosinophils Absolute: 0.4 10*3/uL (ref 0.0–0.5)
Eosinophils Relative: 7 %
HCT: 41.2 % (ref 39.0–52.0)
Hemoglobin: 14 g/dL (ref 13.0–17.0)
Immature Granulocytes: 1 %
Lymphocytes Relative: 33 %
Lymphs Abs: 2 10*3/uL (ref 0.7–4.0)
MCH: 31.1 pg (ref 26.0–34.0)
MCHC: 34 g/dL (ref 30.0–36.0)
MCV: 91.6 fL (ref 80.0–100.0)
Monocytes Absolute: 0.9 10*3/uL (ref 0.1–1.0)
Monocytes Relative: 15 %
Neutro Abs: 2.7 10*3/uL (ref 1.7–7.7)
Neutrophils Relative %: 43 %
Platelet Count: 168 10*3/uL (ref 150–400)
RBC: 4.5 MIL/uL (ref 4.22–5.81)
RDW: 15 % (ref 11.5–15.5)
WBC Count: 6.1 10*3/uL (ref 4.0–10.5)
nRBC: 0 % (ref 0.0–0.2)

## 2020-07-02 MED ORDER — PALONOSETRON HCL INJECTION 0.25 MG/5ML
INTRAVENOUS | Status: AC
  Filled 2020-07-02: qty 5

## 2020-07-02 MED ORDER — SODIUM CHLORIDE 0.9 % IV SOLN
2160.0000 mg/m2 | INTRAVENOUS | Status: DC
  Administered 2020-07-02: 3650 mg via INTRAVENOUS
  Filled 2020-07-02: qty 73

## 2020-07-02 MED ORDER — HEPARIN SOD (PORK) LOCK FLUSH 100 UNIT/ML IV SOLN
500.0000 [IU] | Freq: Once | INTRAVENOUS | Status: DC | PRN
  Filled 2020-07-02: qty 5

## 2020-07-02 MED ORDER — SODIUM CHLORIDE 0.9 % IV SOLN
120.0000 mg/m2 | Freq: Once | INTRAVENOUS | Status: AC
  Administered 2020-07-02: 200 mg via INTRAVENOUS
  Filled 2020-07-02: qty 10

## 2020-07-02 MED ORDER — ATROPINE SULFATE 1 MG/ML IJ SOLN
0.5000 mg | Freq: Once | INTRAMUSCULAR | Status: AC | PRN
  Administered 2020-07-02: 0.5 mg via INTRAVENOUS

## 2020-07-02 MED ORDER — DEXTROSE 5 % IV SOLN
Freq: Once | INTRAVENOUS | Status: AC
Start: 2020-07-02 — End: 2020-07-02
  Filled 2020-07-02: qty 250

## 2020-07-02 MED ORDER — ATROPINE SULFATE 1 MG/ML IJ SOLN
INTRAMUSCULAR | Status: AC
  Filled 2020-07-02: qty 1

## 2020-07-02 MED ORDER — DEXTROSE 5 % IV SOLN
Freq: Once | INTRAVENOUS | Status: AC
  Filled 2020-07-02: qty 250

## 2020-07-02 MED ORDER — SODIUM CHLORIDE 0.9% FLUSH
10.0000 mL | INTRAVENOUS | Status: DC | PRN
  Filled 2020-07-02: qty 10

## 2020-07-02 MED ORDER — SODIUM CHLORIDE 0.9 % IV SOLN
150.0000 mg | Freq: Once | INTRAVENOUS | Status: AC
  Administered 2020-07-02: 150 mg via INTRAVENOUS
  Filled 2020-07-02: qty 150

## 2020-07-02 MED ORDER — SODIUM CHLORIDE 0.9 % IV SOLN
400.0000 mg/m2 | Freq: Once | INTRAVENOUS | Status: AC
  Administered 2020-07-02: 676 mg via INTRAVENOUS
  Filled 2020-07-02: qty 33.8

## 2020-07-02 MED ORDER — OXALIPLATIN CHEMO INJECTION 100 MG/20ML
76.5000 mg/m2 | Freq: Once | INTRAVENOUS | Status: AC
  Administered 2020-07-02: 130 mg via INTRAVENOUS
  Filled 2020-07-02: qty 20

## 2020-07-02 MED ORDER — SODIUM CHLORIDE 0.9 % IV SOLN
10.0000 mg | Freq: Once | INTRAVENOUS | Status: AC
  Administered 2020-07-02: 10 mg via INTRAVENOUS
  Filled 2020-07-02: qty 10

## 2020-07-02 MED ORDER — PALONOSETRON HCL INJECTION 0.25 MG/5ML
0.2500 mg | Freq: Once | INTRAVENOUS | Status: AC
  Administered 2020-07-02: 0.25 mg via INTRAVENOUS

## 2020-07-02 NOTE — Progress Notes (Signed)
Oncology Nurse Navigator Documentation  Oncology Nurse Navigator Flowsheets 07/02/2020  Abnormal Finding Date -  Confirmed Diagnosis Date -  Diagnosis Status -  Planned Course of Treatment -  Phase of Treatment -  Chemotherapy Actual Start Date: -  Navigator Follow Up Date: 07/16/2020  Navigator Follow Up Reason: Follow-up Appointment;Chemotherapy  Navigator Location [facility]  Referral Date to RadOnc/MedOnc -  Navigator Encounter Type Appt/Treatment Plan Review;Treatment  Telephone -  Treatment Initiated Date -  Patient Visit Type MedOnc  Treatment Phase Active Tx  Barriers/Navigation Needs Coordination of Care;Education  Education -  Interventions Psycho-Social Support  Acuity Level 2-Minimal Needs (1-2 Barriers Identified)  Coordination of Care -  Education Method -  Support Groups/Services Friends and Family  Time Spent with Patient 15

## 2020-07-02 NOTE — Progress Notes (Signed)
Hematology and Oncology Follow Up Visit  Dean Singh [id number] 1960/04/20 60 y.o. 07/02/2020   Principle Diagnosis:  Metastatic adenocarcinoma the pancreas-hepatic metastasis   Current Therapy:        FOLFIRINOX- started on 05/20/2020, s/p cycle 2   Interim History:  Dean Singh is here today with his wife for follow-up and treatment. He is symptomatic with fatigue and weakness.  He states that Zofran is effective in treating his nausea.  He has had intermittent diarrhea and will get atropine today with treatment. This has been effective with prior treatment.  No fever, chills, cough, rash, dizziness, SOB, chest pain, palpitations, abdominal pain or changes in bladder habits.  He has not noted any blood loss. No bruising or petechiae.  No swelling in his extremities.  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  No falls or syncope.  He states that he has a good appetite right now and is doing his best to stay well hydrated. His weight is   ECOG Performance Status: 2 - Symptomatic, <50% confined to bed  Medications:  Allergies as of 07/02/2020       Reactions   Statins Itching   Atorvastatin cause "my skin to feel horrible and unbearable".   Morphine Nausea And Vomiting   "felt like chest was exploding" pt reports        Medication List        Accurate as of July 02, 2020  1:02 PM. If you have any questions, ask your nurse or doctor.          aspirin EC 81 MG tablet Take 1 tablet (81 mg total) by mouth daily.   Basaglar KwikPen 100 UNIT/ML Inject 20 Units into the skin daily.   BENEFIBER PO Take 1 Scoop by mouth daily.   cilostazol 100 MG tablet Commonly known as: PLETAL TAKE 1 TABLET BY MOUTH TWICE A DAY   clopidogrel 75 MG tablet Commonly known as: PLAVIX TAKE 1 TABLET BY MOUTH EVERY DAY   dronabinol 5 MG capsule Commonly known as: MARINOL Take 1 capsule (5 mg total) by mouth 2 (two) times daily before lunch and supper.    ezetimibe 10 MG tablet Commonly known as: ZETIA TAKE 1 TABLET BY MOUTH EVERY DAY   famotidine 40 MG tablet Commonly known as: PEPCID TAKE 1 TABLET BY MOUTH EVERY DAY   Farxiga 5 MG Tabs tablet Generic drug: dapagliflozin propanediol TAKE 1 TABLET BY MOUTH DAILY BEFORE BREAKFAST.   fentaNYL 50 MCG/HR Commonly known as: Galax 1 patch onto the skin every 3 (three) days.   FreeStyle Libre 14 Day Reader Kerrin Mo Use Freestyle Libre meter to monitor blood sugars. DX:E11.9   FreeStyle Libre 14 Day Sensor Misc Apply one sensor once every 14 days to monitor blood sugars. DX:E11.9   HYDROcodone-acetaminophen 5-325 MG tablet Commonly known as: Norco Take 1-2 tablets by mouth every 6 (six) hours as needed for moderate pain or severe pain.   Insulin Pen Needle 32G X 4 MM Misc 1 Device by Does not apply route daily.   Januvia 100 MG tablet Generic drug: sitaGLIPtin TAKE 1 TABLET BY MOUTH EVERY DAY   lidocaine-prilocaine cream Commonly known as: EMLA Apply to affected area once   loperamide 2 MG tablet Commonly known as: Imodium A-D Take 2 at onset of diarrhea, then 1 every 2hrs until 12hr without a BM. May take 2 tab every 4hrs at bedtime. If diarrhea recurs repeat.   loperamide 2 MG capsule Commonly known  as: IMODIUM Take 4 mg by mouth 2 (two) times daily.   metFORMIN 1000 MG tablet Commonly known as: GLUCOPHAGE Take 1 tablet (1,000 mg total) by mouth daily with breakfast.   nitroGLYCERIN 0.4 MG SL tablet Commonly known as: NITROSTAT Place 0.4 mg under the tongue every 5 (five) minutes as needed for chest pain.   ondansetron 4 MG tablet Commonly known as: Zofran Take 1 tablet (4 mg total) by mouth every 8 (eight) hours as needed for nausea or vomiting.   ondansetron 8 MG tablet Commonly known as: Zofran Take 1 tablet (8 mg total) by mouth 2 (two) times daily as needed. Start on day 3 after chemotherapy.   OneTouch Ultra test strip Generic drug: glucose  blood USE TWICE DAILY AS DIRECTED ( E11.65)   pantoprazole 40 MG tablet Commonly known as: PROTONIX Take 1 tablet (40 mg total) by mouth daily.   pregabalin 300 MG capsule Commonly known as: LYRICA TAKE 1 CAPSULE BY MOUTH EVERYDAY AT BEDTIME   prochlorperazine 10 MG tablet Commonly known as: COMPAZINE Take 1 tablet (10 mg total) by mouth every 6 (six) hours as needed (Nausea or vomiting).   rosuvastatin 5 MG tablet Commonly known as: CRESTOR TAKE 1 TABLET (5 MG TOTAL) BY MOUTH DAILY.   SAW PALMETTO PO Take 1 capsule by mouth daily as needed (When having prostate problems).   sucralfate 1 GM/10ML suspension Commonly known as: CARAFATE SMARTSIG:Milliliter(s) By Mouth   SUMAtriptan 25 MG tablet Commonly known as: IMITREX Take 1 tablet (25 mg total) by mouth every 2 (two) hours as needed for migraine.        Allergies:  Allergies  Allergen Reactions   Statins Itching    Atorvastatin cause "my skin to feel horrible and unbearable".   Morphine Nausea And Vomiting    "felt like chest was exploding" pt reports     Past Medical History, Surgical history, Social history, and Family History were reviewed and updated.  Review of Systems: All other 10 point review of systems is negative.   Physical Exam:  height is 5\' 3"  (1.6 m) and weight is 134 lb (60.8 kg). His oral temperature is 97.7 F (36.5 C). His blood pressure is 140/68 and his pulse is 71. His respiration is 17 and oxygen saturation is 98%.   Wt Readings from Last 3 Encounters:  07/02/20 134 lb (60.8 kg)  06/12/20 137 lb (62.1 kg)  06/04/20 134 lb (60.8 kg)    Ocular: Sclerae unicteric, pupils equal, round and reactive to light Ear-nose-throat: Oropharynx clear, dentition fair Lymphatic: No cervical or supraclavicular adenopathy Lungs no rales or rhonchi, good excursion bilaterally Heart regular rate and rhythm, no murmur appreciated Abd soft, nontender, positive bowel sounds, no liver or spleen tip  palpated on exam, no fluid wave  MSK no focal spinal tenderness, no joint edema Neuro: non-focal, well-oriented, appropriate affect Breasts: Deferred  Lab Results  Component Value Date   WBC 6.1 07/02/2020   HGB 14.0 07/02/2020   HCT 41.2 07/02/2020   MCV 91.6 07/02/2020   PLT 168 07/02/2020   No results found for: FERRITIN, IRON, TIBC, UIBC, IRONPCTSAT Lab Results  Component Value Date   RBC 4.50 07/02/2020   No results found for: KPAFRELGTCHN, LAMBDASER, KAPLAMBRATIO No results found for: IGGSERUM, IGA, IGMSERUM No results found for: TOTALPROTELP, ALBUMINELP, A1GS, A2GS, BETS, BETA2SER, GAMS, MSPIKE, SPEI   Chemistry      Component Value Date/Time   NA 133 (L) 07/02/2020 1033   NA 132 (L)  05/01/2019 1108   K 4.7 07/02/2020 1033   CL 96 (L) 07/02/2020 1033   CO2 28 07/02/2020 1033   BUN 15 07/02/2020 1033   BUN 18 05/01/2019 1108   CREATININE 0.61 07/02/2020 1033      Component Value Date/Time   CALCIUM 10.2 07/02/2020 1033   ALKPHOS 725 (H) 07/02/2020 1033   AST 54 (H) 07/02/2020 1033   ALT 67 (H) 07/02/2020 1033   BILITOT 1.1 07/02/2020 1033       Impression and Plan: Ms. Gaymon is a very pleasant 60 yo caucasian gentleman with metastatic adenocarcinoma of the pancreas with extensive liver metastasis.  CA 19-9 earlier this month was 98,947 U/ml.  I was able to speak with Dr. Marin Olp and go over his CBC and CMP (elevated LFT's and blood glucose) in detail.  He will get the Atropine today with treatment to help prevent diarrhea.  Dr. Marin Olp did remove the 5-FU bolus from his treatment plan today.  Patient and wife were encouraged to contact our office with any questions or concerns.  Follow-up in 2 weeks.   Laverna Peace, NP 6/28/20221:02 PM

## 2020-07-02 NOTE — Patient Instructions (Signed)
Implanted Port Home Guide An implanted port is a device that is placed under the skin. It is usually placed in the chest. The device can be used to give IV medicine, to take blood, or for dialysis. You may have an implanted port if: You need IV medicine that would be irritating to the small veins in your hands or arms. You need IV medicines, such as antibiotics, for a long period of time. You need IV nutrition for a long period of time. You need dialysis. When you have a port, your health care provider can choose to use the port instead of veins in your arms for these procedures. You may have fewer limitations when using a port than you would if you used other types of long-term IVs, and you will likely be able to return to normal activities afteryour incision heals. An implanted port has two main parts: Reservoir. The reservoir is the part where a needle is inserted to give medicines or draw blood. The reservoir is round. After it is placed, it appears as a small, raised area under your skin. Catheter. The catheter is a thin, flexible tube that connects the reservoir to a vein. Medicine that is inserted into the reservoir goes into the catheter and then into the vein. How is my port accessed? To access your port: A numbing cream may be placed on the skin over the port site. Your health care provider will put on a mask and sterile gloves. The skin over your port will be cleaned carefully with a germ-killing soap and allowed to dry. Your health care provider will gently pinch the port and insert a needle into it. Your health care provider will check for a blood return to make sure the port is in the vein and is not clogged. If your port needs to remain accessed to get medicine continuously (constant infusion), your health care provider will place a clear bandage (dressing) over the needle site. The dressing and needle will need to be changed every week, or as told by your health care provider. What  is flushing? Flushing helps keep the port from getting clogged. Follow instructions from your health care provider about how and when to flush the port. Ports are usually flushed with saline solution or a medicine called heparin. The need for flushing will depend on how the port is used: If the port is only used from time to time to give medicines or draw blood, the port may need to be flushed: Before and after medicines have been given. Before and after blood has been drawn. As part of routine maintenance. Flushing may be recommended every 4-6 weeks. If a constant infusion is running, the port may not need to be flushed. Throw away any syringes in a disposal container that is meant for sharp items (sharps container). You can buy a sharps container from a pharmacy, or you can make one by using an empty hard plastic bottle with a cover. How long will my port stay implanted? The port can stay in for as long as your health care provider thinks it is needed. When it is time for the port to come out, a surgery will be done to remove it. The surgery will be similar to the procedure that was done to putthe port in. Follow these instructions at home:  Flush your port as told by your health care provider. If you need an infusion over several days, follow instructions from your health care provider about how to take   care of your port site. Make sure you: Wash your hands with soap and water before you change your dressing. If soap and water are not available, use alcohol-based hand sanitizer. Change your dressing as told by your health care provider. Place any used dressings or infusion bags into a plastic bag. Throw that bag in the trash. Keep the dressing that covers the needle clean and dry. Do not get it wet. Do not use scissors or sharp objects near the tube. Keep the tube clamped, unless it is being used. Check your port site every day for signs of infection. Check for: Redness, swelling, or  pain. Fluid or blood. Pus or a bad smell. Protect the skin around the port site. Avoid wearing bra straps that rub or irritate the site. Protect the skin around your port from seat belts. Place a soft pad over your chest if needed. Bathe or shower as told by your health care provider. The site may get wet as long as you are not actively receiving an infusion. Return to your normal activities as told by your health care provider. Ask your health care provider what activities are safe for you. Carry a medical alert card or wear a medical alert bracelet at all times. This will let health care providers know that you have an implanted port in case of an emergency. Get help right away if: You have redness, swelling, or pain at the port site. You have fluid or blood coming from your port site. You have pus or a bad smell coming from the port site. You have a fever. Summary Implanted ports are usually placed in the chest for long-term IV access. Follow instructions from your health care provider about flushing the port and changing bandages (dressings). Take care of the area around your port by avoiding clothing that puts pressure on the area, and by watching for signs of infection. Protect the skin around your port from seat belts. Place a soft pad over your chest if needed. Get help right away if you have a fever or you have redness, swelling, pain, drainage, or a bad smell at the port site. This information is not intended to replace advice given to you by your health care provider. Make sure you discuss any questions you have with your healthcare provider. Document Revised: 05/08/2019 Document Reviewed: 05/08/2019 Elsevier Patient Education  2022 [employer] Inc.  

## 2020-07-02 NOTE — Progress Notes (Signed)
Per DR Marin Olp, ok to treat with elevated LFTs. dph

## 2020-07-02 NOTE — Patient Instructions (Signed)
[facility]  Discharge Instructions: Thank you for choosing [facility] to provide your oncology and hematology care.   If you have a lab appointment with the [facility], please go directly to the [facility] and check in at the registration area.  Wear comfortable clothing and clothing appropriate for easy access to any Portacath or PICC line.   We strive to give you quality time with your provider. You may need to reschedule your appointment if you arrive late (15 or more minutes).  Arriving late affects you and other patients whose appointments are after yours.  Also, if you miss three or more appointments without notifying the office, you may be dismissed from the clinic at the provider's discretion.      For prescription refill requests, have your pharmacy contact our office and allow 72 hours for refills to be completed.    Today you received the following chemotherapy and/or immunotherapy agents 5FU, Leucovorin, Oxaliplatin, Irinotecan      To help prevent nausea and vomiting after your treatment, we encourage you to take your nausea medication as directed.  BELOW ARE SYMPTOMS THAT SHOULD BE REPORTED IMMEDIATELY: *FEVER GREATER THAN 100.4 F (38 C) OR HIGHER *CHILLS OR SWEATING *NAUSEA AND VOMITING THAT IS NOT CONTROLLED WITH YOUR NAUSEA MEDICATION *UNUSUAL SHORTNESS OF BREATH *UNUSUAL BRUISING OR BLEEDING *URINARY PROBLEMS (pain or burning when urinating, or frequent urination) *BOWEL PROBLEMS (unusual diarrhea, constipation, pain near the anus) TENDERNESS IN MOUTH AND THROAT WITH OR WITHOUT PRESENCE OF ULCERS (sore throat, sores in mouth, or a toothache) UNUSUAL RASH, SWELLING OR PAIN  UNUSUAL VAGINAL DISCHARGE OR ITCHING   Items with * indicate a potential emergency and should be followed up as soon as possible or go to the Emergency Department if any problems should occur.  Please show the CHEMOTHERAPY ALERT CARD or IMMUNOTHERAPY  ALERT CARD at check-in to the Emergency Department and triage nurse. Should you have questions after your visit or need to cancel or reschedule your appointment, please contact [facility]  [phone] and follow the prompts.  Office hours are 8:00 a.m. to 4:30 p.m. Monday - Friday. Please note that voicemails left after 4:00 p.m. may not be returned until the following business day.  We are closed weekends and major holidays. You have access to a nurse at all times for urgent questions. Please call the main number to the clinic [phone] and follow the prompts.  For any non-urgent questions, you may also contact your provider using MyChart. We now offer e-Visits for anyone 66 and older to request care online for non-urgent symptoms. For details visit [email or web address].   Also download the MyChart app! Go to the app store, search "MyChart", open the app, select [facility], and log in with your MyChart username and password.  Due to Covid, a mask is required upon entering the hospital/clinic. If you do not have a mask, one will be given to you upon arrival. For doctor visits, patients may have 1 support person aged 73 or older with them. For treatment visits, patients cannot have anyone with them due to current Covid guidelines and our immunocompromised population.

## 2020-07-03 ENCOUNTER — Encounter: Payer: Self-pay | Admitting: *Deleted

## 2020-07-03 LAB — CANCER ANTIGEN 19-9: CA 19-9: 80926 U/mL — ABNORMAL HIGH (ref 0–35)

## 2020-07-03 NOTE — Progress Notes (Signed)
Patient's wife requests prescription for walker. He is very week for a few days after his treatment and his wife would like him to have an assistive device during these times to prevent falls. Prescription completed and will be available for patient to pick up tomorrow when he comes into the office for pump dc.  Oncology Nurse Navigator Documentation  Oncology Nurse Navigator Flowsheets 07/03/2020  Abnormal Finding Date -  Confirmed Diagnosis Date -  Diagnosis Status -  Planned Course of Treatment -  Phase of Treatment -  Chemotherapy Actual Start Date: -  Navigator Follow Up Date: 07/16/2020  Navigator Follow Up Reason: Follow-up Appointment;Chemotherapy  Navigator Location [facility]  Referral Date to RadOnc/MedOnc -  Navigator Encounter Type MyChart  Telephone Symptom Mgt  Treatment Initiated Date -  Patient Visit Type MedOnc  Treatment Phase Active Tx  Barriers/Navigation Needs Coordination of Care;Education  Education -  Interventions Coordination of Care  Acuity Level 2-Minimal Needs (1-2 Barriers Identified)  Coordination of Care Other  Education Method -  Support Groups/Services Friends and Family  Time Spent with Patient 30

## 2020-07-04 ENCOUNTER — Inpatient Hospital Stay: Payer: Medicare Other

## 2020-07-04 ENCOUNTER — Other Ambulatory Visit: Payer: Self-pay

## 2020-07-04 VITALS — BP 103/56 | HR 81 | Temp 98.3°F | Resp 17

## 2020-07-04 DIAGNOSIS — C787 Secondary malignant neoplasm of liver and intrahepatic bile duct: Secondary | ICD-10-CM

## 2020-07-04 DIAGNOSIS — Z5111 Encounter for antineoplastic chemotherapy: Secondary | ICD-10-CM | POA: Diagnosis not present

## 2020-07-04 MED ORDER — HEPARIN SOD (PORK) LOCK FLUSH 100 UNIT/ML IV SOLN
500.0000 [IU] | Freq: Once | INTRAVENOUS | Status: AC
  Administered 2020-07-04: 500 [IU] via INTRAVENOUS
  Filled 2020-07-04: qty 5

## 2020-07-04 MED ORDER — SODIUM CHLORIDE 0.9% FLUSH
10.0000 mL | Freq: Once | INTRAVENOUS | Status: AC
Start: 2020-07-04 — End: 2020-07-04
  Administered 2020-07-04: 10 mL via INTRAVENOUS
  Filled 2020-07-04: qty 10

## 2020-07-04 NOTE — Patient Instructions (Signed)

## 2020-07-10 ENCOUNTER — Other Ambulatory Visit: Payer: Medicaid Other

## 2020-07-10 ENCOUNTER — Ambulatory Visit: Payer: Medicaid Other | Admitting: Hematology & Oncology

## 2020-07-10 ENCOUNTER — Ambulatory Visit: Payer: Medicaid Other

## 2020-07-16 ENCOUNTER — Inpatient Hospital Stay: Payer: Medicare Other

## 2020-07-16 ENCOUNTER — Encounter: Payer: Self-pay | Admitting: *Deleted

## 2020-07-16 ENCOUNTER — Inpatient Hospital Stay: Payer: Medicare Other | Attending: Hematology & Oncology

## 2020-07-16 ENCOUNTER — Encounter: Payer: Self-pay | Admitting: Hematology & Oncology

## 2020-07-16 ENCOUNTER — Other Ambulatory Visit: Payer: Self-pay

## 2020-07-16 ENCOUNTER — Inpatient Hospital Stay (HOSPITAL_BASED_OUTPATIENT_CLINIC_OR_DEPARTMENT_OTHER): Payer: Medicare Other | Admitting: Hematology & Oncology

## 2020-07-16 VITALS — BP 146/65 | HR 74

## 2020-07-16 VITALS — BP 123/64 | HR 80 | Temp 98.7°F | Resp 21 | Ht 63.0 in | Wt 128.0 lb

## 2020-07-16 DIAGNOSIS — C259 Malignant neoplasm of pancreas, unspecified: Secondary | ICD-10-CM

## 2020-07-16 DIAGNOSIS — Z5111 Encounter for antineoplastic chemotherapy: Secondary | ICD-10-CM | POA: Insufficient documentation

## 2020-07-16 DIAGNOSIS — C787 Secondary malignant neoplasm of liver and intrahepatic bile duct: Secondary | ICD-10-CM

## 2020-07-16 LAB — CMP (CANCER CENTER ONLY)
ALT: 49 U/L — ABNORMAL HIGH (ref 0–44)
AST: 41 U/L (ref 15–41)
Albumin: 4.3 g/dL (ref 3.5–5.0)
Alkaline Phosphatase: 693 U/L — ABNORMAL HIGH (ref 38–126)
Anion gap: 9 (ref 5–15)
BUN: 13 mg/dL (ref 6–20)
CO2: 27 mmol/L (ref 22–32)
Calcium: 10.5 mg/dL — ABNORMAL HIGH (ref 8.9–10.3)
Chloride: 99 mmol/L (ref 98–111)
Creatinine: 0.62 mg/dL (ref 0.61–1.24)
GFR, Estimated: >60 mL/min (ref >60)
Glucose, Bld: 228 mg/dL — ABNORMAL HIGH (ref 70–99)
Potassium: 4.2 mmol/L (ref 3.5–5.1)
Sodium: 135 mmol/L (ref 135–145)
Total Bilirubin: 1.4 mg/dL — ABNORMAL HIGH (ref 0.3–1.2)
Total Protein: 7.5 g/dL (ref 6.5–8.1)

## 2020-07-16 LAB — CBC WITH DIFFERENTIAL (CANCER CENTER ONLY)
Abs Immature Granulocytes: 0.07 10*3/uL (ref 0.00–0.07)
Basophils Absolute: 0.1 10*3/uL (ref 0.0–0.1)
Basophils Relative: 1 %
Eosinophils Absolute: 0.4 10*3/uL (ref 0.0–0.5)
Eosinophils Relative: 4 %
HCT: 42.7 % (ref 39.0–52.0)
Hemoglobin: 14.6 g/dL (ref 13.0–17.0)
Immature Granulocytes: 1 %
Lymphocytes Relative: 25 %
Lymphs Abs: 2.2 10*3/uL (ref 0.7–4.0)
MCH: 31.5 pg (ref 26.0–34.0)
MCHC: 34.2 g/dL (ref 30.0–36.0)
MCV: 92.2 fL (ref 80.0–100.0)
Monocytes Absolute: 0.8 10*3/uL (ref 0.1–1.0)
Monocytes Relative: 9 %
Neutro Abs: 5.4 10*3/uL (ref 1.7–7.7)
Neutrophils Relative %: 60 %
Platelet Count: 176 10*3/uL (ref 150–400)
RBC: 4.63 MIL/uL (ref 4.22–5.81)
RDW: 15.8 % — ABNORMAL HIGH (ref 11.5–15.5)
WBC Count: 8.9 10*3/uL (ref 4.0–10.5)
nRBC: 0 % (ref 0.0–0.2)

## 2020-07-16 MED ORDER — SODIUM CHLORIDE 0.9 % IV SOLN
16.0000 mg | Freq: Once | INTRAVENOUS | Status: AC
  Administered 2020-07-16: 16 mg via INTRAVENOUS
  Filled 2020-07-16: qty 8

## 2020-07-16 MED ORDER — SODIUM CHLORIDE 0.9 % IV SOLN
10.0000 mg | Freq: Once | INTRAVENOUS | Status: AC
  Administered 2020-07-16: 10 mg via INTRAVENOUS
  Filled 2020-07-16: qty 10

## 2020-07-16 MED ORDER — HEPARIN SOD (PORK) LOCK FLUSH 100 UNIT/ML IV SOLN
500.0000 [IU] | Freq: Once | INTRAVENOUS | Status: AC
  Administered 2020-07-16: 500 [IU] via INTRAVENOUS
  Filled 2020-07-16: qty 5

## 2020-07-16 MED ORDER — SODIUM CHLORIDE 0.9% FLUSH
10.0000 mL | Freq: Once | INTRAVENOUS | Status: AC
Start: 2020-07-16 — End: 2020-07-16
  Administered 2020-07-16: 10 mL
  Filled 2020-07-16: qty 10

## 2020-07-16 MED ORDER — SODIUM CHLORIDE 0.9 % IV SOLN
INTRAVENOUS | Status: AC
Start: 2020-07-16 — End: 2020-07-16
  Filled 2020-07-16 (×2): qty 250

## 2020-07-16 NOTE — Progress Notes (Signed)
Patient continues to manage poorly after his treatment with moderate to severe side effects. Treatment will be held today, and Dr Marin Olp will modify his treatment plan going forward.   Oncology Nurse Navigator Documentation  Oncology Nurse Navigator Flowsheets 07/16/2020  Abnormal Finding Date -  Confirmed Diagnosis Date -  Diagnosis Status -  Planned Course of Treatment -  Phase of Treatment -  Chemotherapy Actual Start Date: -  Navigator Follow Up Date: 07/24/2020  Navigator Follow Up Reason: Follow-up Appointment;Chemotherapy  Navigator Location [facility]  Referral Date to RadOnc/MedOnc -  Navigator Encounter Type Treatment;Appt/Treatment Plan Review  Telephone -  Treatment Initiated Date -  Patient Visit Type MedOnc  Treatment Phase Active Tx  Barriers/Navigation Needs Coordination of Care;Education  Education -  Interventions Psycho-Social Support  Acuity Level 2-Minimal Needs (1-2 Barriers Identified)  Coordination of Care -  Education Method -  Support Groups/Services Friends and Family  Time Spent with Patient 15

## 2020-07-16 NOTE — Progress Notes (Signed)
DISCONTINUE ON PATHWAY REGIMEN - Pancreatic Adenocarcinoma     A cycle is every 14 days:     Oxaliplatin      Leucovorin      Irinotecan      Fluorouracil   **Always confirm dose/schedule in your pharmacy ordering system**  REASON: Toxicities / Adverse Event PRIOR TREATMENT: PANOS98: mFOLFIRINOX q14 Days Until Progression or Toxicity TREATMENT RESPONSE: Partial Response (PR)  START ON PATHWAY REGIMEN - Pancreatic Adenocarcinoma     A cycle is every 28 days:     Nab-paclitaxel (protein bound)      Gemcitabine   **Always confirm dose/schedule in your pharmacy ordering system**  Patient Characteristics: Metastatic Disease, Second Line, MSS/pMMR or MSI Unknown, Fluoropyrimidine-Based Therapy First Line Therapeutic Status: Metastatic Disease Line of Therapy: Second Line Microsatellite/Mismatch Repair Status: MSS/pMMR Intent of Therapy: Non-Curative / Palliative Intent, Discussed with Patient

## 2020-07-16 NOTE — Patient Instructions (Signed)

## 2020-07-16 NOTE — Progress Notes (Signed)
Hematology and Oncology Follow Up Visit  Khayman L. Cavell [id number] 11-25-1960 60 y.o. 07/16/2020   Principle Diagnosis:  Metastatic adenocarcinoma the pancreas-hepatic metastasis  Current Therapy:   FOLFIRINOX-s/p cycle #2 - start on 05/20/2020 --d/c on 07/16/2020 due to toxicity Abraxane/Gemzar -- start cycle #1 on 07/25/2020     Interim History:  Mr. Kallam is back for follow-up.  Unfortunately, it is apparent he is not can tolerate the FOLFIRINOX protocol.  He just is not doing well.  He has had diarrhea.  He is weak.  Is really not having any good quality of life.  Because of this, I think were going to have to make a change in protocol.  His CA 19-9 overnight is coming down.  The last level was 81,000.  Hopefully, he will be able to tolerate the Abraxane/Gemzar protocol.  I just want his quality of life to be better.  I just hate that he is not doing all that well.  I know he is trying really hard.  His wife doing a great job with him.  He has had a decent appetite.  He has had no cough.  There is no bleeding.  There is no fever.  He has had no leg swelling.  Currently, I would have to say his performance status is ECOG 2.    Medications:  Current Outpatient Medications:    aspirin EC 81 MG tablet, Take 1 tablet (81 mg total) by mouth daily., Disp:  , Rfl:    cilostazol (PLETAL) 100 MG tablet, TAKE 1 TABLET BY MOUTH TWICE A DAY, Disp: 180 tablet, Rfl: 1   clopidogrel (PLAVIX) 75 MG tablet, TAKE 1 TABLET BY MOUTH EVERY DAY (Patient taking differently: Take 75 mg by mouth daily.), Disp: 90 tablet, Rfl: 3   Continuous Blood Gluc Receiver (FREESTYLE LIBRE 14 DAY READER) DEVI, Use Freestyle Libre meter to monitor blood sugars. DX:E11.9, Disp: 1 each, Rfl: 0   Continuous Blood Gluc Sensor (FREESTYLE LIBRE 14 DAY SENSOR) MISC, Apply one sensor once every 14 days to monitor blood sugars. DX:E11.9, Disp: 2 each, Rfl: 2   dronabinol (MARINOL) 5 MG capsule, Take 1 capsule (5 mg total) by  mouth 2 (two) times daily before lunch and supper., Disp: 60 capsule, Rfl: 0   ezetimibe (ZETIA) 10 MG tablet, TAKE 1 TABLET BY MOUTH EVERY DAY (Patient taking differently: Take 10 mg by mouth daily.), Disp: 90 tablet, Rfl: 3   famotidine (PEPCID) 40 MG tablet, TAKE 1 TABLET BY MOUTH EVERY DAY, Disp: 90 tablet, Rfl: 1   FARXIGA 5 MG TABS tablet, TAKE 1 TABLET BY MOUTH DAILY BEFORE BREAKFAST., Disp: 30 tablet, Rfl: 2   fentaNYL (DURAGESIC) 50 MCG/HR, 1 patch every 3 (three) days., Disp: , Rfl:    glucose blood (ONETOUCH ULTRA) test strip, USE TWICE DAILY AS DIRECTED ( E11.65), Disp: 200 each, Rfl: 12   HYDROcodone-acetaminophen (NORCO) 5-325 MG tablet, Take 1-2 tablets by mouth every 6 (six) hours as needed for moderate pain or severe pain., Disp: 60 tablet, Rfl: 0   Insulin Glargine (BASAGLAR KWIKPEN) 100 UNIT/ML, Inject 20 Units into the skin daily., Disp: 30 mL, Rfl: 4   Insulin Pen Needle 32G X 4 MM MISC, 1 Device by Does not apply route daily., Disp: 50 each, Rfl: 6   JANUVIA 100 MG tablet, TAKE 1 TABLET BY MOUTH EVERY DAY, Disp: 90 tablet, Rfl: 1   lidocaine-prilocaine (EMLA) cream, Apply to affected area once, Disp: 30 g, Rfl: 3   loperamide (IMODIUM  A-D) 2 MG tablet, Take 2 at onset of diarrhea, then 1 every 2hrs until 12hr without a BM. May take 2 tab every 4hrs at bedtime. If diarrhea recurs repeat., Disp: 100 tablet, Rfl: 1   loperamide (IMODIUM) 2 MG capsule, Take 4 mg by mouth 2 (two) times daily., Disp: , Rfl:    metFORMIN (GLUCOPHAGE) 1000 MG tablet, Take 1 tablet (1,000 mg total) by mouth daily with breakfast., Disp: 90 tablet, Rfl: 3   nitroGLYCERIN (NITROSTAT) 0.4 MG SL tablet, Place 0.4 mg under the tongue every 5 (five) minutes as needed for chest pain., Disp: , Rfl:    ondansetron (ZOFRAN) 4 MG tablet, Take 1 tablet (4 mg total) by mouth every 8 (eight) hours as needed for nausea or vomiting., Disp: 40 tablet, Rfl: 0   ondansetron (ZOFRAN) 8 MG tablet, Take 1 tablet (8 mg total)  by mouth 2 (two) times daily as needed. Start on day 3 after chemotherapy., Disp: 30 tablet, Rfl: 1   pantoprazole (PROTONIX) 40 MG tablet, Take 1 tablet (40 mg total) by mouth daily., Disp: 90 tablet, Rfl: 3   pregabalin (LYRICA) 300 MG capsule, TAKE 1 CAPSULE BY MOUTH EVERYDAY AT BEDTIME, Disp: 30 capsule, Rfl: 2   prochlorperazine (COMPAZINE) 10 MG tablet, Take 1 tablet (10 mg total) by mouth every 6 (six) hours as needed (Nausea or vomiting)., Disp: 30 tablet, Rfl: 1   rosuvastatin (CRESTOR) 5 MG tablet, TAKE 1 TABLET (5 MG TOTAL) BY MOUTH DAILY., Disp: 90 tablet, Rfl: 0   Saw Palmetto, Serenoa repens, (SAW PALMETTO PO), Take 1 capsule by mouth daily as needed (When having prostate problems). , Disp: , Rfl:    sucralfate (CARAFATE) 1 GM/10ML suspension, SMARTSIG:Milliliter(s) By Mouth, Disp: , Rfl:    SUMAtriptan (IMITREX) 25 MG tablet, Take 1 tablet (25 mg total) by mouth every 2 (two) hours as needed for migraine., Disp: 30 tablet, Rfl: 0   Wheat Dextrin (BENEFIBER PO), Take 1 Scoop by mouth daily., Disp: , Rfl:   Allergies:  Allergies  Allergen Reactions   Statins Itching    Atorvastatin cause "my skin to feel horrible and unbearable".   Morphine Nausea And Vomiting    "felt like chest was exploding" pt reports     Past Medical History, Surgical history, Social history, and Family History were reviewed and updated.  Review of Systems: Review of Systems  Constitutional:  Positive for fatigue.  HENT:  Negative.    Eyes: Negative.   Respiratory: Negative.    Cardiovascular: Negative.   Gastrointestinal:  Positive for abdominal pain.  Endocrine: Negative.   Musculoskeletal:  Positive for arthralgias, flank pain and myalgias.  Skin: Negative.   Neurological: Negative.   Hematological: Negative.   Psychiatric/Behavioral: Negative.     Physical Exam:  height is 5\' 3"  (1.6 m) and weight is 128 lb (58.1 kg). His oral temperature is 98.7 F (37.1 C). His blood pressure is 123/64  and his pulse is 80. His respiration is 21 (abnormal) and oxygen saturation is 100%.   Wt Readings from Last 3 Encounters:  07/16/20 128 lb (58.1 kg)  07/02/20 134 lb (60.8 kg)  06/12/20 137 lb (62.1 kg)    Physical Exam Vitals reviewed.  HENT:     Head: Normocephalic and atraumatic.  Eyes:     Pupils: Pupils are equal, round, and reactive to light.  Cardiovascular:     Rate and Rhythm: Normal rate and regular rhythm.     Heart sounds: Normal heart sounds.  Pulmonary:     Effort: Pulmonary effort is normal.     Breath sounds: Normal breath sounds.  Abdominal:     General: Bowel sounds are normal.     Palpations: Abdomen is soft.  Musculoskeletal:        General: No tenderness or deformity. Normal range of motion.     Cervical back: Normal range of motion.  Lymphadenopathy:     Cervical: No cervical adenopathy.  Skin:    General: Skin is warm and dry.     Findings: No erythema or rash.  Neurological:     Mental Status: He is alert and oriented to person, place, and time.  Psychiatric:        Behavior: Behavior normal.        Thought Content: Thought content normal.        Judgment: Judgment normal.     Lab Results  Component Value Date   WBC 8.9 07/16/2020   HGB 14.6 07/16/2020   HCT 42.7 07/16/2020   MCV 92.2 07/16/2020   PLT 176 07/16/2020     Chemistry      Component Value Date/Time   NA 133 (L) 07/02/2020 1033   NA 132 (L) 05/01/2019 1108   K 4.7 07/02/2020 1033   CL 96 (L) 07/02/2020 1033   CO2 28 07/02/2020 1033   BUN 15 07/02/2020 1033   BUN 18 05/01/2019 1108   CREATININE 0.61 07/02/2020 1033      Component Value Date/Time   CALCIUM 10.2 07/02/2020 1033   ALKPHOS 725 (H) 07/02/2020 1033   AST 54 (H) 07/02/2020 1033   ALT 67 (H) 07/02/2020 1033   BILITOT 1.1 07/02/2020 1033      Impression and Plan: Mr. Louque is a very nice 60 year old white male.  He has metastatic adenocarcinoma the pancreas.  He has extensive liver metastasis.  We will  make a change with his protocol.  I will start him on Abraxane/gemcitabine.  I explained to him that he could certainly lose his hair.  I told him that this may not be as potent as the FOLFIRINOX but he should be able to tolerate this better.  This is really what we are looking for.  I will go ahead and get a scan on him so we can see how everything looks prior to starting this new protocol.  I just hate that he has to make a change.  Again, it is his quality of life that we are really looking for and hopefully we will be able to improve the quality of life.  We will try to get started next week with his new protocol.  Volanda Napoleon, MD 7/12/20229:00 AM

## 2020-07-17 ENCOUNTER — Encounter: Payer: Self-pay | Admitting: Hematology & Oncology

## 2020-07-17 LAB — CANCER ANTIGEN 19-9: CA 19-9: 48329 U/mL — ABNORMAL HIGH (ref 0–35)

## 2020-07-17 NOTE — Progress Notes (Signed)
Pharmacist Chemotherapy Monitoring - Initial Assessment    Anticipated start date: 07/24/20  The following has been reviewed per standard work regarding the patient's treatment regimen: The patient's diagnosis, treatment plan and drug doses, and organ/hematologic function Lab orders and baseline tests specific to treatment regimen  The treatment plan start date, drug sequencing, and pre-medications Prior authorization status  Patient's documented medication list, including drug-drug interaction screen and prescriptions for anti-emetics and supportive care specific to the treatment regimen The drug concentrations, fluid compatibility, administration routes, and timing of the medications to be used The patient's access for treatment and lifetime cumulative dose history, if applicable  The patient's medication allergies and previous infusion related reactions, if applicable   Changes made to treatment plan:  N/A  Follow up needed:  N/A   Damani Kelemen, Jacqlyn Larsen, [facility], 07/17/2020  8:17 AM

## 2020-07-18 ENCOUNTER — Telehealth: Payer: Self-pay | Admitting: *Deleted

## 2020-07-18 NOTE — Telephone Encounter (Signed)
Patient's wife notified that "the tumor level is down to 48000!!!  Great job!!! Film/video editor"  Pt.'s wife is appreciative of call and has no questions at this time.

## 2020-07-18 NOTE — Telephone Encounter (Signed)
-----   Message from Dean Napoleon, MD sent at 07/17/2020  4:36 PM EDT ----- Call - the tumor level is down to 48000!!!  Great job!!!  [name]

## 2020-07-23 ENCOUNTER — Other Ambulatory Visit: Payer: Self-pay

## 2020-07-23 ENCOUNTER — Ambulatory Visit (HOSPITAL_BASED_OUTPATIENT_CLINIC_OR_DEPARTMENT_OTHER)
Admission: RE | Admit: 2020-07-23 | Discharge: 2020-07-23 | Disposition: A | Discharge status: Home / Self Care | Payer: Medicare Other | Source: Ambulatory Visit | Attending: Hematology & Oncology | Admitting: Hematology & Oncology

## 2020-07-23 DIAGNOSIS — C259 Malignant neoplasm of pancreas, unspecified: Secondary | ICD-10-CM | POA: Diagnosis not present

## 2020-07-23 DIAGNOSIS — C787 Secondary malignant neoplasm of liver and intrahepatic bile duct: Secondary | ICD-10-CM | POA: Insufficient documentation

## 2020-07-23 MED ORDER — IOHEXOL 300 MG/ML  SOLN
75.0000 mL | Freq: Once | INTRAMUSCULAR | Status: AC | PRN
  Administered 2020-07-23: 75 mL via INTRAVENOUS

## 2020-07-24 ENCOUNTER — Inpatient Hospital Stay: Payer: Medicare Other

## 2020-07-24 ENCOUNTER — Other Ambulatory Visit: Payer: Self-pay | Admitting: *Deleted

## 2020-07-24 ENCOUNTER — Inpatient Hospital Stay (HOSPITAL_BASED_OUTPATIENT_CLINIC_OR_DEPARTMENT_OTHER): Payer: Medicare Other | Admitting: Hematology & Oncology

## 2020-07-24 ENCOUNTER — Encounter: Payer: Self-pay | Admitting: Hematology & Oncology

## 2020-07-24 ENCOUNTER — Encounter: Payer: Self-pay | Admitting: *Deleted

## 2020-07-24 ENCOUNTER — Other Ambulatory Visit: Payer: Self-pay | Admitting: Hematology & Oncology

## 2020-07-24 VITALS — BP 110/69 | HR 72 | Temp 98.7°F | Resp 18 | Wt 129.0 lb

## 2020-07-24 DIAGNOSIS — C787 Secondary malignant neoplasm of liver and intrahepatic bile duct: Secondary | ICD-10-CM

## 2020-07-24 DIAGNOSIS — C259 Malignant neoplasm of pancreas, unspecified: Secondary | ICD-10-CM

## 2020-07-24 DIAGNOSIS — Z5111 Encounter for antineoplastic chemotherapy: Secondary | ICD-10-CM | POA: Diagnosis not present

## 2020-07-24 LAB — CBC WITH DIFFERENTIAL (CANCER CENTER ONLY)
Abs Immature Granulocytes: 0.04 10*3/uL (ref 0.00–0.07)
Basophils Absolute: 0.1 10*3/uL (ref 0.0–0.1)
Basophils Relative: 2 %
Eosinophils Absolute: 0.3 10*3/uL (ref 0.0–0.5)
Eosinophils Relative: 7 %
HCT: 37.5 % — ABNORMAL LOW (ref 39.0–52.0)
Hemoglobin: 12.7 g/dL — ABNORMAL LOW (ref 13.0–17.0)
Immature Granulocytes: 1 %
Lymphocytes Relative: 35 %
Lymphs Abs: 1.6 10*3/uL (ref 0.7–4.0)
MCH: 31.8 pg (ref 26.0–34.0)
MCHC: 33.9 g/dL (ref 30.0–36.0)
MCV: 94 fL (ref 80.0–100.0)
Monocytes Absolute: 0.6 10*3/uL (ref 0.1–1.0)
Monocytes Relative: 14 %
Neutro Abs: 1.9 10*3/uL (ref 1.7–7.7)
Neutrophils Relative %: 41 %
Platelet Count: 141 10*3/uL — ABNORMAL LOW (ref 150–400)
RBC: 3.99 MIL/uL — ABNORMAL LOW (ref 4.22–5.81)
RDW: 15.6 % — ABNORMAL HIGH (ref 11.5–15.5)
WBC Count: 4.6 10*3/uL (ref 4.0–10.5)
nRBC: 0 % (ref 0.0–0.2)

## 2020-07-24 LAB — CMP (CANCER CENTER ONLY)
ALT: 50 U/L — ABNORMAL HIGH (ref 0–44)
AST: 41 U/L (ref 15–41)
Albumin: 3.9 g/dL (ref 3.5–5.0)
Alkaline Phosphatase: 583 U/L — ABNORMAL HIGH (ref 38–126)
Anion gap: 8 (ref 5–15)
BUN: 19 mg/dL (ref 6–20)
CO2: 27 mmol/L (ref 22–32)
Calcium: 9.5 mg/dL (ref 8.9–10.3)
Chloride: 97 mmol/L — ABNORMAL LOW (ref 98–111)
Creatinine: 0.71 mg/dL (ref 0.61–1.24)
GFR, Estimated: >60 mL/min (ref >60)
Glucose, Bld: 361 mg/dL — ABNORMAL HIGH (ref 70–99)
Potassium: 4.3 mmol/L (ref 3.5–5.1)
Sodium: 132 mmol/L — ABNORMAL LOW (ref 135–145)
Total Bilirubin: 1 mg/dL (ref 0.3–1.2)
Total Protein: 7.1 g/dL (ref 6.5–8.1)

## 2020-07-24 LAB — LACTATE DEHYDROGENASE: LDH: 214 U/L — ABNORMAL HIGH (ref 98–192)

## 2020-07-24 MED ORDER — SODIUM CHLORIDE 0.9 % IV SOLN
Freq: Once | INTRAVENOUS | Status: AC
  Filled 2020-07-24: qty 250

## 2020-07-24 MED ORDER — ONDANSETRON HCL 8 MG PO TABS: 8.0000 mg | ORAL_TABLET | Freq: Two times a day (BID) | ORAL | 1 refills | Status: DC | PRN

## 2020-07-24 MED ORDER — LIDOCAINE-PRILOCAINE 2.5-2.5 % EX CREA
TOPICAL_CREAM | CUTANEOUS | 3 refills | Status: DC
Start: 2020-07-24 — End: 2020-07-24

## 2020-07-24 MED ORDER — SODIUM CHLORIDE 0.9 % IV SOLN
Freq: Once | INTRAVENOUS | Status: DC
  Filled 2020-07-24: qty 250

## 2020-07-24 MED ORDER — LORAZEPAM 0.5 MG PO TABS: 0.5000 mg | ORAL_TABLET | Freq: Four times a day (QID) | ORAL | 0 refills | Status: DC | PRN

## 2020-07-24 MED ORDER — HEPARIN SOD (PORK) LOCK FLUSH 100 UNIT/ML IV SOLN
500.0000 [IU] | Freq: Once | INTRAVENOUS | Status: AC | PRN
  Administered 2020-07-24: 500 [IU]
  Filled 2020-07-24: qty 5

## 2020-07-24 MED ORDER — SODIUM CHLORIDE 0.9% FLUSH
10.0000 mL | INTRAVENOUS | Status: DC | PRN
  Administered 2020-07-24: 10 mL
  Filled 2020-07-24: qty 10

## 2020-07-24 MED ORDER — SODIUM CHLORIDE 0.9 % IV SOLN
900.0000 mg/m2 | Freq: Once | INTRAVENOUS | Status: AC
  Administered 2020-07-24: 1444 mg via INTRAVENOUS
  Filled 2020-07-24: qty 26.3

## 2020-07-24 MED ORDER — PROCHLORPERAZINE MALEATE 10 MG PO TABS
10.0000 mg | ORAL_TABLET | Freq: Once | ORAL | Status: AC
  Administered 2020-07-24: 10 mg via ORAL

## 2020-07-24 MED ORDER — PACLITAXEL PROTEIN-BOUND CHEMO INJECTION 100 MG
112.5000 mg/m2 | Freq: Once | INTRAVENOUS | Status: AC
  Administered 2020-07-24: 175 mg via INTRAVENOUS
  Filled 2020-07-24: qty 35

## 2020-07-24 MED ORDER — PROCHLORPERAZINE MALEATE 10 MG PO TABS: 10.0000 mg | ORAL_TABLET | Freq: Four times a day (QID) | ORAL | 1 refills | Status: AC | PRN

## 2020-07-24 MED ORDER — PROCHLORPERAZINE MALEATE 10 MG PO TABS
ORAL_TABLET | ORAL | Status: AC
  Filled 2020-07-24: qty 1

## 2020-07-24 NOTE — Patient Instructions (Signed)
Implanted Port Home Guide An implanted port is a device that is placed under the skin. It is usually placed in the chest. The device can be used to give IV medicine, to take blood, or for dialysis. You may have an implanted port if: You need IV medicine that would be irritating to the small veins in your hands or arms. You need IV medicines, such as antibiotics, for a long period of time. You need IV nutrition for a long period of time. You need dialysis. When you have a port, your health care provider can choose to use the port instead of veins in your arms for these procedures. You may have fewer limitations when using a port than you would if you used other types of long-term IVs, and you will likely be able to return to normal activities afteryour incision heals. An implanted port has two main parts: Reservoir. The reservoir is the part where a needle is inserted to give medicines or draw blood. The reservoir is round. After it is placed, it appears as a small, raised area under your skin. Catheter. The catheter is a thin, flexible tube that connects the reservoir to a vein. Medicine that is inserted into the reservoir goes into the catheter and then into the vein. How is my port accessed? To access your port: A numbing cream may be placed on the skin over the port site. Your health care provider will put on a mask and sterile gloves. The skin over your port will be cleaned carefully with a germ-killing soap and allowed to dry. Your health care provider will gently pinch the port and insert a needle into it. Your health care provider will check for a blood return to make sure the port is in the vein and is not clogged. If your port needs to remain accessed to get medicine continuously (constant infusion), your health care provider will place a clear bandage (dressing) over the needle site. The dressing and needle will need to be changed every week, or as told by your health care provider. What  is flushing? Flushing helps keep the port from getting clogged. Follow instructions from your health care provider about how and when to flush the port. Ports are usually flushed with saline solution or a medicine called heparin. The need for flushing will depend on how the port is used: If the port is only used from time to time to give medicines or draw blood, the port may need to be flushed: Before and after medicines have been given. Before and after blood has been drawn. As part of routine maintenance. Flushing may be recommended every 4-6 weeks. If a constant infusion is running, the port may not need to be flushed. Throw away any syringes in a disposal container that is meant for sharp items (sharps container). You can buy a sharps container from a pharmacy, or you can make one by using an empty hard plastic bottle with a cover. How long will my port stay implanted? The port can stay in for as long as your health care provider thinks it is needed. When it is time for the port to come out, a surgery will be done to remove it. The surgery will be similar to the procedure that was done to putthe port in. Follow these instructions at home:  Flush your port as told by your health care provider. If you need an infusion over several days, follow instructions from your health care provider about how to take   care of your port site. Make sure you: Wash your hands with soap and water before you change your dressing. If soap and water are not available, use alcohol-based hand sanitizer. Change your dressing as told by your health care provider. Place any used dressings or infusion bags into a plastic bag. Throw that bag in the trash. Keep the dressing that covers the needle clean and dry. Do not get it wet. Do not use scissors or sharp objects near the tube. Keep the tube clamped, unless it is being used. Check your port site every day for signs of infection. Check for: Redness, swelling, or  pain. Fluid or blood. Pus or a bad smell. Protect the skin around the port site. Avoid wearing bra straps that rub or irritate the site. Protect the skin around your port from seat belts. Place a soft pad over your chest if needed. Bathe or shower as told by your health care provider. The site may get wet as long as you are not actively receiving an infusion. Return to your normal activities as told by your health care provider. Ask your health care provider what activities are safe for you. Carry a medical alert card or wear a medical alert bracelet at all times. This will let health care providers know that you have an implanted port in case of an emergency. Get help right away if: You have redness, swelling, or pain at the port site. You have fluid or blood coming from your port site. You have pus or a bad smell coming from the port site. You have a fever. Summary Implanted ports are usually placed in the chest for long-term IV access. Follow instructions from your health care provider about flushing the port and changing bandages (dressings). Take care of the area around your port by avoiding clothing that puts pressure on the area, and by watching for signs of infection. Protect the skin around your port from seat belts. Place a soft pad over your chest if needed. Get help right away if you have a fever or you have redness, swelling, pain, drainage, or a bad smell at the port site. This information is not intended to replace advice given to you by your health care provider. Make sure you discuss any questions you have with your healthcare provider. Document Revised: 05/08/2019 Document Reviewed: 05/08/2019 Elsevier Patient Education  2022 [employer] Inc.  

## 2020-07-24 NOTE — Progress Notes (Signed)
Hematology and Oncology Follow Up Visit  Dean Singh [id number] April 15, 1960 60 y.o. 07/24/2020   Principle Diagnosis:  Metastatic adenocarcinoma the pancreas-hepatic metastasis  Current Therapy:   FOLFIRINOX-s/p cycle #2 - start on 05/20/2020 --d/c on 07/16/2020 due to toxicity Abraxane/Gemzar -- start cycle #1 on 07/25/2020     Interim History:  Dean Singh is back for follow-up.  Dean Singh is responding to treatment.  His last CA 19-9 was down to 45,000.  We did do a CT scan on him couple days ago.  The CT scan thankfully showed that his disease is smaller.  However, Dean Singh just cannot tolerate the FOLFIRINOX protocol.  We will start him on the Abraxane/Gemzar protocol today.  Hopefully, Dean Singh will do okay with this.  I just want his quality of life to be better.  I want him to be able to eat.  Dean Singh is lost a little weight.  Dean Singh just does not have a lot of energy.  Dean Singh is fatigued all the time.  The hot weather really bothers him.  Dean Singh tries to get out in the hot weather and gets sick.  Is been no bleeding.  Pain is doing quite well for him right now.  Dean Singh has had this managed very nicely.  There is been no leg swelling.  Has had no rashes.  There is no cough.  Think Dean Singh is cut back on tobacco use.  Overall, his performance status is ECOG 2.     Medications:  Current Outpatient Medications:    aspirin EC 81 MG tablet, Take 1 tablet (81 mg total) by mouth daily., Disp:  , Rfl:    cilostazol (PLETAL) 100 MG tablet, TAKE 1 TABLET BY MOUTH TWICE A DAY, Disp: 180 tablet, Rfl: 1   clopidogrel (PLAVIX) 75 MG tablet, TAKE 1 TABLET BY MOUTH EVERY DAY (Patient taking differently: Take 75 mg by mouth daily.), Disp: 90 tablet, Rfl: 3   Continuous Blood Gluc Receiver (FREESTYLE LIBRE 14 DAY READER) DEVI, Use Freestyle Libre meter to monitor blood sugars. DX:E11.9, Disp: 1 each, Rfl: 0   Continuous Blood Gluc Sensor (FREESTYLE LIBRE 14 DAY SENSOR) MISC, Apply one sensor once every 14 days to monitor blood  sugars. DX:E11.9, Disp: 2 each, Rfl: 2   dronabinol (MARINOL) 5 MG capsule, Take 1 capsule (5 mg total) by mouth 2 (two) times daily before lunch and supper., Disp: 60 capsule, Rfl: 0   ezetimibe (ZETIA) 10 MG tablet, TAKE 1 TABLET BY MOUTH EVERY DAY (Patient taking differently: Take 10 mg by mouth daily.), Disp: 90 tablet, Rfl: 3   famotidine (PEPCID) 40 MG tablet, TAKE 1 TABLET BY MOUTH EVERY DAY, Disp: 90 tablet, Rfl: 1   FARXIGA 5 MG TABS tablet, TAKE 1 TABLET BY MOUTH DAILY BEFORE BREAKFAST., Disp: 30 tablet, Rfl: 2   fentaNYL (DURAGESIC) 50 MCG/HR, 1 patch every 3 (three) days., Disp: , Rfl:    glucose blood (ONETOUCH ULTRA) test strip, USE TWICE DAILY AS DIRECTED ( E11.65), Disp: 200 each, Rfl: 12   HYDROcodone-acetaminophen (NORCO) 5-325 MG tablet, Take 1-2 tablets by mouth every 6 (six) hours as needed for moderate pain or severe pain., Disp: 60 tablet, Rfl: 0   Insulin Glargine (BASAGLAR KWIKPEN) 100 UNIT/ML, Inject 20 Units into the skin daily., Disp: 30 mL, Rfl: 4   Insulin Pen Needle 32G X 4 MM MISC, 1 Device by Does not apply route daily., Disp: 50 each, Rfl: 6   JANUVIA 100 MG tablet, TAKE 1 TABLET BY MOUTH EVERY DAY,  Disp: 90 tablet, Rfl: 1   lidocaine-prilocaine (EMLA) cream, APPLY TO AFFECTED AREA ONCE AS DIRECTED, Disp: 30 g, Rfl: 3   loperamide (IMODIUM) 2 MG capsule, Take 4 mg by mouth 2 (two) times daily., Disp: , Rfl:    LORazepam (ATIVAN) 0.5 MG tablet, Take 1 tablet (0.5 mg total) by mouth every 6 (six) hours as needed (Nausea or vomiting)., Disp: 30 tablet, Rfl: 0   metFORMIN (GLUCOPHAGE) 1000 MG tablet, Take 1 tablet (1,000 mg total) by mouth daily with breakfast., Disp: 90 tablet, Rfl: 3   nitroGLYCERIN (NITROSTAT) 0.4 MG SL tablet, Place 0.4 mg under the tongue every 5 (five) minutes as needed for chest pain., Disp: , Rfl:    ondansetron (ZOFRAN) 4 MG tablet, Take 1 tablet (4 mg total) by mouth every 8 (eight) hours as needed for nausea or vomiting., Disp: 40 tablet, Rfl:  0   ondansetron (ZOFRAN) 8 MG tablet, Take 1 tablet (8 mg total) by mouth 2 (two) times daily as needed (Nausea or vomiting)., Disp: 30 tablet, Rfl: 1   pantoprazole (PROTONIX) 40 MG tablet, Take 1 tablet (40 mg total) by mouth daily., Disp: 90 tablet, Rfl: 3   pregabalin (LYRICA) 300 MG capsule, TAKE 1 CAPSULE BY MOUTH EVERYDAY AT BEDTIME, Disp: 30 capsule, Rfl: 2   prochlorperazine (COMPAZINE) 10 MG tablet, Take 1 tablet (10 mg total) by mouth every 6 (six) hours as needed (Nausea or vomiting)., Disp: 30 tablet, Rfl: 1   rosuvastatin (CRESTOR) 5 MG tablet, TAKE 1 TABLET (5 MG TOTAL) BY MOUTH DAILY., Disp: 90 tablet, Rfl: 0   Saw Palmetto, Serenoa repens, (SAW PALMETTO PO), Take 1 capsule by mouth daily as needed (When having prostate problems). , Disp: , Rfl:    sucralfate (CARAFATE) 1 GM/10ML suspension, SMARTSIG:Milliliter(s) By Mouth, Disp: , Rfl:    SUMAtriptan (IMITREX) 25 MG tablet, Take 1 tablet (25 mg total) by mouth every 2 (two) hours as needed for migraine., Disp: 30 tablet, Rfl: 0   Wheat Dextrin (BENEFIBER PO), Take 1 Scoop by mouth daily., Disp: , Rfl:   Current Facility-Administered Medications:    0.9 %  sodium chloride infusion, , Intravenous, Once, Ka Bench, Rudell Cobb, MD   gemcitabine (GEMZAR) 1,444 mg in sodium chloride 0.9 % 250 mL chemo infusion, 900 mg/m2 (Treatment Plan Recorded), Intravenous, Once, Mirakle Tomlin, Rudell Cobb, MD   heparin lock flush 100 unit/mL, 500 Units, Intracatheter, Once PRN, Anida Deol, Rudell Cobb, MD   PACLitaxel-protein bound (ABRAXANE) chemo infusion 175 mg, 112.5 mg/m2 (Treatment Plan Recorded), Intravenous, Once, Volanda Napoleon, MD, Last Rate: 70 mL/hr at 07/24/20 1149, 175 mg at 07/24/20 1149   sodium chloride flush (NS) 0.9 % injection 10 mL, 10 mL, Intracatheter, PRN, Volanda Napoleon, MD  Allergies:  Allergies  Allergen Reactions   Statins Itching    Atorvastatin cause "my skin to feel horrible and unbearable".   Morphine Nausea And Vomiting    "felt  like chest was exploding" pt reports     Past Medical History, Surgical history, Social history, and Family History were reviewed and updated.  Review of Systems: Review of Systems  Constitutional:  Positive for fatigue.  HENT:  Negative.    Eyes: Negative.   Respiratory: Negative.    Cardiovascular: Negative.   Gastrointestinal:  Positive for abdominal pain.  Endocrine: Negative.   Musculoskeletal:  Positive for arthralgias, flank pain and myalgias.  Skin: Negative.   Neurological: Negative.   Hematological: Negative.   Psychiatric/Behavioral: Negative.     Physical  Exam:  weight is 129 lb (58.5 kg). His oral temperature is 98.7 F (37.1 C). His blood pressure is 110/69 and his pulse is 72. His respiration is 18 and oxygen saturation is 97%.   Wt Readings from Last 3 Encounters:  07/24/20 129 lb (58.5 kg)  07/16/20 128 lb (58.1 kg)  07/02/20 134 lb (60.8 kg)    Physical Exam Vitals reviewed.  HENT:     Head: Normocephalic and atraumatic.  Eyes:     Pupils: Pupils are equal, round, and reactive to light.  Cardiovascular:     Rate and Rhythm: Normal rate and regular rhythm.     Heart sounds: Normal heart sounds.  Pulmonary:     Effort: Pulmonary effort is normal.     Breath sounds: Normal breath sounds.  Abdominal:     General: Bowel sounds are normal.     Palpations: Abdomen is soft.  Musculoskeletal:        General: No tenderness or deformity. Normal range of motion.     Cervical back: Normal range of motion.  Lymphadenopathy:     Cervical: No cervical adenopathy.  Skin:    General: Skin is warm and dry.     Findings: No erythema or rash.  Neurological:     Mental Status: Dean Singh is alert and oriented to person, place, and time.  Psychiatric:        Behavior: Behavior normal.        Thought Content: Thought content normal.        Judgment: Judgment normal.     Lab Results  Component Value Date   WBC 4.6 07/24/2020   HGB 12.7 (L) 07/24/2020   HCT 37.5 (L)  07/24/2020   MCV 94.0 07/24/2020   PLT 141 (L) 07/24/2020     Chemistry      Component Value Date/Time   NA 132 (L) 07/24/2020 0917   NA 132 (L) 05/01/2019 1108   K 4.3 07/24/2020 0917   CL 97 (L) 07/24/2020 0917   CO2 27 07/24/2020 0917   BUN 19 07/24/2020 0917   BUN 18 05/01/2019 1108   CREATININE 0.71 07/24/2020 0917      Component Value Date/Time   CALCIUM 9.5 07/24/2020 0917   ALKPHOS 583 (H) 07/24/2020 0917   AST 41 07/24/2020 0917   ALT 50 (H) 07/24/2020 0917   BILITOT 1.0 07/24/2020 0917      Impression and Plan: Dean Singh is a very nice 60 year old white male.  Dean Singh has metastatic adenocarcinoma the pancreas.  Dean Singh has extensive liver metastasis.  We will start the Abraxane/Gemzar protocol today.  Again, hopefully Dean Singh will tolerate this.  I am glad that Dean Singh at least had a response to the FOLFIRINOX.  Dean Singh just had a hard time with side effects even with the reduced dosage of FOLFIRINOX.  We will go ahead and plan as cycle 1 now.  I will then plan to see him back we start cycle 2.  The CA 19-9 clearly will show [place] what is going on and how Dean Singh is responding.    Volanda Napoleon, MD 7/20/202212:18 PM

## 2020-07-24 NOTE — Patient Instructions (Signed)
[facility]  Discharge Instructions: Thank you for choosing [facility] to provide your oncology and hematology care.   If you have a lab appointment with the [facility], please go directly to the [facility] and check in at the registration area.  Wear comfortable clothing and clothing appropriate for easy access to any Portacath or PICC line.   We strive to give you quality time with your provider. You may need to reschedule your appointment if you arrive late (15 or more minutes).  Arriving late affects you and other patients whose appointments are after yours.  Also, if you miss three or more appointments without notifying the office, you may be dismissed from the clinic at the provider's discretion.      For prescription refill requests, have your pharmacy contact our office and allow 72 hours for refills to be completed.    Today you received the following chemotherapy and/or immunotherapy agents  aBRAXANE AND gEMZAR   To help prevent nausea and vomiting after your treatment, we encourage you to take your nausea medication as directed.  BELOW ARE SYMPTOMS THAT SHOULD BE REPORTED IMMEDIATELY: *FEVER GREATER THAN 100.4 F (38 C) OR HIGHER *CHILLS OR SWEATING *NAUSEA AND VOMITING THAT IS NOT CONTROLLED WITH YOUR NAUSEA MEDICATION *UNUSUAL SHORTNESS OF BREATH *UNUSUAL BRUISING OR BLEEDING *URINARY PROBLEMS (pain or burning when urinating, or frequent urination) *BOWEL PROBLEMS (unusual diarrhea, constipation, pain near the anus) TENDERNESS IN MOUTH AND THROAT WITH OR WITHOUT PRESENCE OF ULCERS (sore throat, sores in mouth, or a toothache) UNUSUAL RASH, SWELLING OR PAIN  UNUSUAL VAGINAL DISCHARGE OR ITCHING   Items with * indicate a potential emergency and should be followed up as soon as possible or go to the Emergency Department if any problems should occur.  Please show the CHEMOTHERAPY ALERT CARD or IMMUNOTHERAPY ALERT CARD at check-in  to the Emergency Department and triage nurse. Should you have questions after your visit or need to cancel or reschedule your appointment, please contact [facility]  [phone] and follow the prompts.  Office hours are 8:00 a.m. to 4:30 p.m. Monday - Friday. Please note that voicemails left after 4:00 p.m. may not be returned until the following business day.  We are closed weekends and major holidays. You have access to a nurse at all times for urgent questions. Please call the main number to the clinic [phone] and follow the prompts.  For any non-urgent questions, you may also contact your provider using MyChart. We now offer e-Visits for anyone 76 and older to request care online for non-urgent symptoms. For details visit [email or web address].   Also download the MyChart app! Go to the app store, search "MyChart", open the app, select [facility], and log in with your MyChart username and password.  Due to Covid, a mask is required upon entering the hospital/clinic. If you do not have a mask, one will be given to you upon arrival. For doctor visits, patients may have 1 support person aged 65 or older with them. For treatment visits, patients cannot have anyone with them due to current Covid guidelines and our immunocompromised population.

## 2020-07-24 NOTE — Patient Instructions (Signed)
[facility]  Discharge Instructions: Thank you for choosing [facility] to provide your oncology and hematology care.   If you have a lab appointment with the [facility], please go directly to the [facility] and check in at the registration area.  Wear comfortable clothing and clothing appropriate for easy access to any Portacath or PICC line.   We strive to give you quality time with your provider. You may need to reschedule your appointment if you arrive late (15 or more minutes).  Arriving late affects you and other patients whose appointments are after yours.  Also, if you miss three or more appointments without notifying the office, you may be dismissed from the clinic at the provider's discretion.      For prescription refill requests, have your pharmacy contact our office and allow 72 hours for refills to be completed.    Today you received the following chemotherapy and/or immunotherapy agents Gemzar and Abraxane      To help prevent nausea and vomiting after your treatment, we encourage you to take your nausea medication as directed.  BELOW ARE SYMPTOMS THAT SHOULD BE REPORTED IMMEDIATELY: *FEVER GREATER THAN 100.4 F (38 C) OR HIGHER *CHILLS OR SWEATING *NAUSEA AND VOMITING THAT IS NOT CONTROLLED WITH YOUR NAUSEA MEDICATION *UNUSUAL SHORTNESS OF BREATH *UNUSUAL BRUISING OR BLEEDING *URINARY PROBLEMS (pain or burning when urinating, or frequent urination) *BOWEL PROBLEMS (unusual diarrhea, constipation, pain near the anus) TENDERNESS IN MOUTH AND THROAT WITH OR WITHOUT PRESENCE OF ULCERS (sore throat, sores in mouth, or a toothache) UNUSUAL RASH, SWELLING OR PAIN  UNUSUAL VAGINAL DISCHARGE OR ITCHING   Items with * indicate a potential emergency and should be followed up as soon as possible or go to the Emergency Department if any problems should occur.  Please show the CHEMOTHERAPY ALERT CARD or IMMUNOTHERAPY ALERT CARD at  check-in to the Emergency Department and triage nurse. Should you have questions after your visit or need to cancel or reschedule your appointment, please contact [facility]  [phone] and follow the prompts.  Office hours are 8:00 a.m. to 4:30 p.m. Monday - Friday. Please note that voicemails left after 4:00 p.m. may not be returned until the following business day.  We are closed weekends and major holidays. You have access to a nurse at all times for urgent questions. Please call the main number to the clinic [phone] and follow the prompts.  For any non-urgent questions, you may also contact your provider using MyChart. We now offer e-Visits for anyone 26 and older to request care online for non-urgent symptoms. For details visit [email or web address].   Also download the MyChart app! Go to the app store, search "MyChart", open the app, select [facility], and log in with your MyChart username and password.  Due to Covid, a mask is required upon entering the hospital/clinic. If you do not have a mask, one will be given to you upon arrival. For doctor visits, patients may have 1 support person aged 4 or older with them. For treatment visits, patients cannot have anyone with them due to current Covid guidelines and our immunocompromised population.

## 2020-07-25 ENCOUNTER — Other Ambulatory Visit: Payer: Self-pay | Admitting: *Deleted

## 2020-07-25 LAB — CANCER ANTIGEN 19-9: CA 19-9: 31796 U/mL — ABNORMAL HIGH (ref 0–35)

## 2020-07-25 MED ORDER — SUMATRIPTAN SUCCINATE 25 MG PO TABS: 25.0000 mg | ORAL_TABLET | ORAL | 0 refills | Status: DC | PRN

## 2020-07-30 ENCOUNTER — Encounter: Payer: Self-pay | Admitting: *Deleted

## 2020-07-30 NOTE — Progress Notes (Signed)
Patient started new treatment regimen. CT scan shows response to his first regimen, however he was unable to tolerate it.   Oncology Nurse Navigator Documentation  Oncology Nurse Navigator Flowsheets 07/30/2020  Abnormal Finding Date -  Confirmed Diagnosis Date -  Diagnosis Status -  Planned Course of Treatment -  Phase of Treatment -  Chemotherapy Actual Start Date: -  Navigator Follow Up Date: 08/20/2020  Navigator Follow Up Reason: Follow-up Appointment;Chemotherapy  Navigator Location [facility]  Referral Date to RadOnc/MedOnc -  Navigator Encounter Type Appt/Treatment Plan Review  Telephone -  Treatment Initiated Date -  Patient Visit Type MedOnc  Treatment Phase Active Tx  Barriers/Navigation Needs Coordination of Care;Education  Education -  Interventions None Required  Acuity Level 2-Minimal Needs (1-2 Barriers Identified)  Coordination of Care -  Education Method -  Support Groups/Services Friends and Family  Time Spent with Patient 15

## 2020-07-31 ENCOUNTER — Inpatient Hospital Stay: Payer: Medicare Other

## 2020-07-31 ENCOUNTER — Other Ambulatory Visit: Payer: Self-pay

## 2020-07-31 DIAGNOSIS — C787 Secondary malignant neoplasm of liver and intrahepatic bile duct: Secondary | ICD-10-CM

## 2020-07-31 DIAGNOSIS — C259 Malignant neoplasm of pancreas, unspecified: Secondary | ICD-10-CM

## 2020-07-31 DIAGNOSIS — Z5111 Encounter for antineoplastic chemotherapy: Secondary | ICD-10-CM | POA: Diagnosis not present

## 2020-07-31 LAB — CMP (CANCER CENTER ONLY)
ALT: 83 U/L — ABNORMAL HIGH (ref 0–44)
AST: 62 U/L — ABNORMAL HIGH (ref 15–41)
Albumin: 4 g/dL (ref 3.5–5.0)
Alkaline Phosphatase: 612 U/L — ABNORMAL HIGH (ref 38–126)
Anion gap: 8 (ref 5–15)
BUN: 14 mg/dL (ref 6–20)
CO2: 27 mmol/L (ref 22–32)
Calcium: 9.8 mg/dL (ref 8.9–10.3)
Chloride: 99 mmol/L (ref 98–111)
Creatinine: 0.65 mg/dL (ref 0.61–1.24)
GFR, Estimated: >60 mL/min (ref >60)
Glucose, Bld: 261 mg/dL — ABNORMAL HIGH (ref 70–99)
Potassium: 4.2 mmol/L (ref 3.5–5.1)
Sodium: 134 mmol/L — ABNORMAL LOW (ref 135–145)
Total Bilirubin: 1 mg/dL (ref 0.3–1.2)
Total Protein: 7.4 g/dL (ref 6.5–8.1)

## 2020-07-31 LAB — CBC WITH DIFFERENTIAL (CANCER CENTER ONLY)
Abs Immature Granulocytes: 0.04 10*3/uL (ref 0.00–0.07)
Basophils Absolute: 0 10*3/uL (ref 0.0–0.1)
Basophils Relative: 1 %
Eosinophils Absolute: 0.1 10*3/uL (ref 0.0–0.5)
Eosinophils Relative: 2 %
HCT: 37.9 % — ABNORMAL LOW (ref 39.0–52.0)
Hemoglobin: 12.8 g/dL — ABNORMAL LOW (ref 13.0–17.0)
Immature Granulocytes: 1 %
Lymphocytes Relative: 52 %
Lymphs Abs: 1.8 10*3/uL (ref 0.7–4.0)
MCH: 32 pg (ref 26.0–34.0)
MCHC: 33.8 g/dL (ref 30.0–36.0)
MCV: 94.8 fL (ref 80.0–100.0)
Monocytes Absolute: 0.2 10*3/uL (ref 0.1–1.0)
Monocytes Relative: 7 %
Neutro Abs: 1.3 10*3/uL — ABNORMAL LOW (ref 1.7–7.7)
Neutrophils Relative %: 37 %
Platelet Count: 53 10*3/uL — ABNORMAL LOW (ref 150–400)
RBC: 4 MIL/uL — ABNORMAL LOW (ref 4.22–5.81)
RDW: 15 % (ref 11.5–15.5)
WBC Count: 3.5 10*3/uL — ABNORMAL LOW (ref 4.0–10.5)
nRBC: 0 % (ref 0.0–0.2)

## 2020-07-31 NOTE — Patient Instructions (Signed)
[facility]  Discharge Instructions: Thank you for choosing [facility] to provide your oncology and hematology care.   If you have a lab appointment with the [facility], please go directly to the [facility] and check in at the registration area.  Wear comfortable clothing and clothing appropriate for easy access to any Portacath or PICC line.   We strive to give you quality time with your provider. You may need to reschedule your appointment if you arrive late (15 or more minutes).  Arriving late affects you and other patients whose appointments are after yours.  Also, if you miss three or more appointments without notifying the office, you may be dismissed from the clinic at the provider's discretion.      For prescription refill requests, have your pharmacy contact our office and allow 72 hours for refills to be completed.    Today you received the following chemotherapy and/or immunotherapy agents Gemzar and Abraxane      To help prevent nausea and vomiting after your treatment, we encourage you to take your nausea medication as directed.  BELOW ARE SYMPTOMS THAT SHOULD BE REPORTED IMMEDIATELY: *FEVER GREATER THAN 100.4 F (38 C) OR HIGHER *CHILLS OR SWEATING *NAUSEA AND VOMITING THAT IS NOT CONTROLLED WITH YOUR NAUSEA MEDICATION *UNUSUAL SHORTNESS OF BREATH *UNUSUAL BRUISING OR BLEEDING *URINARY PROBLEMS (pain or burning when urinating, or frequent urination) *BOWEL PROBLEMS (unusual diarrhea, constipation, pain near the anus) TENDERNESS IN MOUTH AND THROAT WITH OR WITHOUT PRESENCE OF ULCERS (sore throat, sores in mouth, or a toothache) UNUSUAL RASH, SWELLING OR PAIN  UNUSUAL VAGINAL DISCHARGE OR ITCHING   Items with * indicate a potential emergency and should be followed up as soon as possible or go to the Emergency Department if any problems should occur.  Please show the CHEMOTHERAPY ALERT CARD or IMMUNOTHERAPY ALERT CARD at  check-in to the Emergency Department and triage nurse. Should you have questions after your visit or need to cancel or reschedule your appointment, please contact [facility]  [phone] and follow the prompts.  Office hours are 8:00 a.m. to 4:30 p.m. Monday - Friday. Please note that voicemails left after 4:00 p.m. may not be returned until the following business day.  We are closed weekends and major holidays. You have access to a nurse at all times for urgent questions. Please call the main number to the clinic [phone] and follow the prompts.  For any non-urgent questions, you may also contact your provider using MyChart. We now offer e-Visits for anyone 59 and older to request care online for non-urgent symptoms. For details visit [email or web address].   Also download the MyChart app! Go to the app store, search "MyChart", open the app, select [facility], and log in with your MyChart username and password.  Due to Covid, a mask is required upon entering the hospital/clinic. If you do not have a mask, one will be given to you upon arrival. For doctor visits, patients may have 1 support person aged 59 or older with them. For treatment visits, patients cannot have anyone with them due to current Covid guidelines and our immunocompromised population.

## 2020-07-31 NOTE — Patient Instructions (Signed)
Implanted Port Home Guide An implanted port is a device that is placed under the skin. It is usually placed in the chest. The device can be used to give IV medicine, to take blood, or for dialysis. You may have an implanted port if: You need IV medicine that would be irritating to the small veins in your hands or arms. You need IV medicines, such as antibiotics, for a long period of time. You need IV nutrition for a long period of time. You need dialysis. When you have a port, your health care provider can choose to use the port instead of veins in your arms for these procedures. You may have fewer limitations when using a port than you would if you used other types of long-term IVs, and you will likely be able to return to normal activities afteryour incision heals. An implanted port has two main parts: Reservoir. The reservoir is the part where a needle is inserted to give medicines or draw blood. The reservoir is round. After it is placed, it appears as a small, raised area under your skin. Catheter. The catheter is a thin, flexible tube that connects the reservoir to a vein. Medicine that is inserted into the reservoir goes into the catheter and then into the vein. How is my port accessed? To access your port: A numbing cream may be placed on the skin over the port site. Your health care provider will put on a mask and sterile gloves. The skin over your port will be cleaned carefully with a germ-killing soap and allowed to dry. Your health care provider will gently pinch the port and insert a needle into it. Your health care provider will check for a blood return to make sure the port is in the vein and is not clogged. If your port needs to remain accessed to get medicine continuously (constant infusion), your health care provider will place a clear bandage (dressing) over the needle site. The dressing and needle will need to be changed every week, or as told by your health care provider. What  is flushing? Flushing helps keep the port from getting clogged. Follow instructions from your health care provider about how and when to flush the port. Ports are usually flushed with saline solution or a medicine called heparin. The need for flushing will depend on how the port is used: If the port is only used from time to time to give medicines or draw blood, the port may need to be flushed: Before and after medicines have been given. Before and after blood has been drawn. As part of routine maintenance. Flushing may be recommended every 4-6 weeks. If a constant infusion is running, the port may not need to be flushed. Throw away any syringes in a disposal container that is meant for sharp items (sharps container). You can buy a sharps container from a pharmacy, or you can make one by using an empty hard plastic bottle with a cover. How long will my port stay implanted? The port can stay in for as long as your health care provider thinks it is needed. When it is time for the port to come out, a surgery will be done to remove it. The surgery will be similar to the procedure that was done to putthe port in. Follow these instructions at home:  Flush your port as told by your health care provider. If you need an infusion over several days, follow instructions from your health care provider about how to take   care of your port site. Make sure you: Wash your hands with soap and water before you change your dressing. If soap and water are not available, use alcohol-based hand sanitizer. Change your dressing as told by your health care provider. Place any used dressings or infusion bags into a plastic bag. Throw that bag in the trash. Keep the dressing that covers the needle clean and dry. Do not get it wet. Do not use scissors or sharp objects near the tube. Keep the tube clamped, unless it is being used. Check your port site every day for signs of infection. Check for: Redness, swelling, or  pain. Fluid or blood. Pus or a bad smell. Protect the skin around the port site. Avoid wearing bra straps that rub or irritate the site. Protect the skin around your port from seat belts. Place a soft pad over your chest if needed. Bathe or shower as told by your health care provider. The site may get wet as long as you are not actively receiving an infusion. Return to your normal activities as told by your health care provider. Ask your health care provider what activities are safe for you. Carry a medical alert card or wear a medical alert bracelet at all times. This will let health care providers know that you have an implanted port in case of an emergency. Get help right away if: You have redness, swelling, or pain at the port site. You have fluid or blood coming from your port site. You have pus or a bad smell coming from the port site. You have a fever. Summary Implanted ports are usually placed in the chest for long-term IV access. Follow instructions from your health care provider about flushing the port and changing bandages (dressings). Take care of the area around your port by avoiding clothing that puts pressure on the area, and by watching for signs of infection. Protect the skin around your port from seat belts. Place a soft pad over your chest if needed. Get help right away if you have a fever or you have redness, swelling, pain, drainage, or a bad smell at the port site. This information is not intended to replace advice given to you by your health care provider. Make sure you discuss any questions you have with your healthcare provider. Document Revised: 05/08/2019 Document Reviewed: 05/08/2019 Elsevier Patient Education  2022 [employer] Inc.  

## 2020-07-31 NOTE — Progress Notes (Signed)
No treatment today d/t platelets of 53 per Dr Marin Olp. Pt is already scheduled for next week 8/3. Pt and wife aware and verbalize understanding. dph

## 2020-08-07 ENCOUNTER — Inpatient Hospital Stay: Payer: Medicare Other | Attending: Hematology & Oncology

## 2020-08-07 ENCOUNTER — Other Ambulatory Visit: Payer: Self-pay

## 2020-08-07 ENCOUNTER — Inpatient Hospital Stay: Payer: Medicare Other

## 2020-08-07 ENCOUNTER — Encounter: Payer: Self-pay | Admitting: Hematology & Oncology

## 2020-08-07 DIAGNOSIS — C259 Malignant neoplasm of pancreas, unspecified: Secondary | ICD-10-CM | POA: Insufficient documentation

## 2020-08-07 DIAGNOSIS — Z5111 Encounter for antineoplastic chemotherapy: Secondary | ICD-10-CM | POA: Insufficient documentation

## 2020-08-07 LAB — CBC WITH DIFFERENTIAL (CANCER CENTER ONLY)
Abs Immature Granulocytes: 0.08 10*3/uL — ABNORMAL HIGH (ref 0.00–0.07)
Basophils Absolute: 0.1 10*3/uL (ref 0.0–0.1)
Basophils Relative: 1 %
Eosinophils Absolute: 0.3 10*3/uL (ref 0.0–0.5)
Eosinophils Relative: 4 %
HCT: 38 % — ABNORMAL LOW (ref 39.0–52.0)
Hemoglobin: 12.9 g/dL — ABNORMAL LOW (ref 13.0–17.0)
Immature Granulocytes: 1 %
Lymphocytes Relative: 27 %
Lymphs Abs: 2.1 10*3/uL (ref 0.7–4.0)
MCH: 32.3 pg (ref 26.0–34.0)
MCHC: 33.9 g/dL (ref 30.0–36.0)
MCV: 95.2 fL (ref 80.0–100.0)
Monocytes Absolute: 0.9 10*3/uL (ref 0.1–1.0)
Monocytes Relative: 11 %
Neutro Abs: 4.3 10*3/uL (ref 1.7–7.7)
Neutrophils Relative %: 56 %
Platelet Count: 225 10*3/uL (ref 150–400)
RBC: 3.99 MIL/uL — ABNORMAL LOW (ref 4.22–5.81)
RDW: 15.9 % — ABNORMAL HIGH (ref 11.5–15.5)
WBC Count: 7.7 10*3/uL (ref 4.0–10.5)
nRBC: 0 % (ref 0.0–0.2)

## 2020-08-07 LAB — CMP (CANCER CENTER ONLY)
ALT: 47 U/L — ABNORMAL HIGH (ref 0–44)
AST: 39 U/L (ref 15–41)
Albumin: 3.9 g/dL (ref 3.5–5.0)
Alkaline Phosphatase: 520 U/L — ABNORMAL HIGH (ref 38–126)
Anion gap: 7 (ref 5–15)
BUN: 9 mg/dL (ref 6–20)
CO2: 28 mmol/L (ref 22–32)
Calcium: 9.7 mg/dL (ref 8.9–10.3)
Chloride: 99 mmol/L (ref 98–111)
Creatinine: 0.56 mg/dL — ABNORMAL LOW (ref 0.61–1.24)
GFR, Estimated: >60 mL/min (ref >60)
Glucose, Bld: 206 mg/dL — ABNORMAL HIGH (ref 70–99)
Potassium: 4.2 mmol/L (ref 3.5–5.1)
Sodium: 134 mmol/L — ABNORMAL LOW (ref 135–145)
Total Bilirubin: 0.9 mg/dL (ref 0.3–1.2)
Total Protein: 7.2 g/dL (ref 6.5–8.1)

## 2020-08-07 MED ORDER — PROCHLORPERAZINE MALEATE 10 MG PO TABS
ORAL_TABLET | ORAL | Status: AC
  Filled 2020-08-07: qty 1

## 2020-08-07 MED ORDER — PROCHLORPERAZINE MALEATE 10 MG PO TABS
10.0000 mg | ORAL_TABLET | Freq: Once | ORAL | Status: AC
  Administered 2020-08-07: 10 mg via ORAL

## 2020-08-07 MED ORDER — HEPARIN SOD (PORK) LOCK FLUSH 100 UNIT/ML IV SOLN
500.0000 [IU] | Freq: Once | INTRAVENOUS | Status: AC | PRN
  Administered 2020-08-07: 500 [IU]
  Filled 2020-08-07: qty 5

## 2020-08-07 MED ORDER — SODIUM CHLORIDE 0.9% FLUSH
10.0000 mL | INTRAVENOUS | Status: DC | PRN
Start: 2020-08-07 — End: 2020-08-07
  Administered 2020-08-07: 10 mL
  Filled 2020-08-07: qty 10

## 2020-08-07 MED ORDER — SODIUM CHLORIDE 0.9 % IV SOLN
Freq: Once | INTRAVENOUS | Status: AC
  Filled 2020-08-07: qty 250

## 2020-08-07 MED ORDER — PACLITAXEL PROTEIN-BOUND CHEMO INJECTION 100 MG
112.5000 mg/m2 | Freq: Once | INTRAVENOUS | Status: AC
  Administered 2020-08-07: 175 mg via INTRAVENOUS
  Filled 2020-08-07: qty 35

## 2020-08-07 MED ORDER — SODIUM CHLORIDE 0.9 % IV SOLN
900.0000 mg/m2 | Freq: Once | INTRAVENOUS | Status: AC
  Administered 2020-08-07: 1444 mg via INTRAVENOUS
  Filled 2020-08-07: qty 26.3

## 2020-08-07 NOTE — Patient Instructions (Signed)

## 2020-08-07 NOTE — Patient Instructions (Signed)
[facility]  Discharge Instructions: Thank you for choosing [facility] to provide your oncology and hematology care.   If you have a lab appointment with the [facility], please go directly to the [facility] and check in at the registration area.  Wear comfortable clothing and clothing appropriate for easy access to any Portacath or PICC line.   We strive to give you quality time with your provider. You may need to reschedule your appointment if you arrive late (15 or more minutes).  Arriving late affects you and other patients whose appointments are after yours.  Also, if you miss three or more appointments without notifying the office, you may be dismissed from the clinic at the provider's discretion.      For prescription refill requests, have your pharmacy contact our office and allow 72 hours for refills to be completed.    Today you received the following chemotherapy and/or immunotherapy agents Gemzar and Abraxane      To help prevent nausea and vomiting after your treatment, we encourage you to take your nausea medication as directed.  BELOW ARE SYMPTOMS THAT SHOULD BE REPORTED IMMEDIATELY: *FEVER GREATER THAN 100.4 F (38 C) OR HIGHER *CHILLS OR SWEATING *NAUSEA AND VOMITING THAT IS NOT CONTROLLED WITH YOUR NAUSEA MEDICATION *UNUSUAL SHORTNESS OF BREATH *UNUSUAL BRUISING OR BLEEDING *URINARY PROBLEMS (pain or burning when urinating, or frequent urination) *BOWEL PROBLEMS (unusual diarrhea, constipation, pain near the anus) TENDERNESS IN MOUTH AND THROAT WITH OR WITHOUT PRESENCE OF ULCERS (sore throat, sores in mouth, or a toothache) UNUSUAL RASH, SWELLING OR PAIN  UNUSUAL VAGINAL DISCHARGE OR ITCHING   Items with * indicate a potential emergency and should be followed up as soon as possible or go to the Emergency Department if any problems should occur.  Please show the CHEMOTHERAPY ALERT CARD or IMMUNOTHERAPY ALERT CARD at  check-in to the Emergency Department and triage nurse. Should you have questions after your visit or need to cancel or reschedule your appointment, please contact [facility]  [phone] and follow the prompts.  Office hours are 8:00 a.m. to 4:30 p.m. Monday - Friday. Please note that voicemails left after 4:00 p.m. may not be returned until the following business day.  We are closed weekends and major holidays. You have access to a nurse at all times for urgent questions. Please call the main number to the clinic [phone] and follow the prompts.  For any non-urgent questions, you may also contact your provider using MyChart. We now offer e-Visits for anyone 26 and older to request care online for non-urgent symptoms. For details visit [email or web address].   Also download the MyChart app! Go to the app store, search "MyChart", open the app, select [facility], and log in with your MyChart username and password.  Due to Covid, a mask is required upon entering the hospital/clinic. If you do not have a mask, one will be given to you upon arrival. For doctor visits, patients may have 1 support person aged 4 or older with them. For treatment visits, patients cannot have anyone with them due to current Covid guidelines and our immunocompromised population.

## 2020-08-09 ENCOUNTER — Other Ambulatory Visit: Payer: Self-pay | Admitting: *Deleted

## 2020-08-09 ENCOUNTER — Encounter: Payer: Self-pay | Admitting: *Deleted

## 2020-08-09 DIAGNOSIS — C259 Malignant neoplasm of pancreas, unspecified: Secondary | ICD-10-CM

## 2020-08-14 ENCOUNTER — Other Ambulatory Visit: Payer: Self-pay | Admitting: Hematology & Oncology

## 2020-08-14 ENCOUNTER — Other Ambulatory Visit: Payer: Self-pay | Admitting: Family Medicine

## 2020-08-14 DIAGNOSIS — C259 Malignant neoplasm of pancreas, unspecified: Secondary | ICD-10-CM

## 2020-08-15 ENCOUNTER — Inpatient Hospital Stay: Payer: Medicare Other

## 2020-08-15 ENCOUNTER — Other Ambulatory Visit: Payer: Self-pay

## 2020-08-15 ENCOUNTER — Ambulatory Visit: Payer: Medicaid Other | Admitting: Hematology & Oncology

## 2020-08-15 DIAGNOSIS — C259 Malignant neoplasm of pancreas, unspecified: Secondary | ICD-10-CM

## 2020-08-15 DIAGNOSIS — C787 Secondary malignant neoplasm of liver and intrahepatic bile duct: Secondary | ICD-10-CM

## 2020-08-15 DIAGNOSIS — Z5111 Encounter for antineoplastic chemotherapy: Secondary | ICD-10-CM | POA: Diagnosis not present

## 2020-08-15 LAB — CBC WITH DIFFERENTIAL (CANCER CENTER ONLY)
Abs Immature Granulocytes: 0.14 10*3/uL — ABNORMAL HIGH (ref 0.00–0.07)
Basophils Absolute: 0.1 10*3/uL (ref 0.0–0.1)
Basophils Relative: 1 %
Eosinophils Absolute: 0.1 10*3/uL (ref 0.0–0.5)
Eosinophils Relative: 2 %
HCT: 37.6 % — ABNORMAL LOW (ref 39.0–52.0)
Hemoglobin: 12.8 g/dL — ABNORMAL LOW (ref 13.0–17.0)
Immature Granulocytes: 2 %
Lymphocytes Relative: 40 %
Lymphs Abs: 2.3 10*3/uL (ref 0.7–4.0)
MCH: 32.4 pg (ref 26.0–34.0)
MCHC: 34 g/dL (ref 30.0–36.0)
MCV: 95.2 fL (ref 80.0–100.0)
Monocytes Absolute: 0.8 10*3/uL (ref 0.1–1.0)
Monocytes Relative: 14 %
Neutro Abs: 2.4 10*3/uL (ref 1.7–7.7)
Neutrophils Relative %: 41 %
Platelet Count: 128 10*3/uL — ABNORMAL LOW (ref 150–400)
RBC: 3.95 MIL/uL — ABNORMAL LOW (ref 4.22–5.81)
RDW: 15 % (ref 11.5–15.5)
WBC Count: 5.7 10*3/uL (ref 4.0–10.5)
nRBC: 0 % (ref 0.0–0.2)

## 2020-08-15 LAB — CMP (CANCER CENTER ONLY)
ALT: 71 U/L — ABNORMAL HIGH (ref 0–44)
AST: 53 U/L — ABNORMAL HIGH (ref 15–41)
Albumin: 3.9 g/dL (ref 3.5–5.0)
Alkaline Phosphatase: 568 U/L — ABNORMAL HIGH (ref 38–126)
Anion gap: 9 (ref 5–15)
BUN: 13 mg/dL (ref 6–20)
CO2: 25 mmol/L (ref 22–32)
Calcium: 9.9 mg/dL (ref 8.9–10.3)
Chloride: 100 mmol/L (ref 98–111)
Creatinine: 0.62 mg/dL (ref 0.61–1.24)
GFR, Estimated: >60 mL/min (ref >60)
Glucose, Bld: 214 mg/dL — ABNORMAL HIGH (ref 70–99)
Potassium: 4.5 mmol/L (ref 3.5–5.1)
Sodium: 134 mmol/L — ABNORMAL LOW (ref 135–145)
Total Bilirubin: 0.9 mg/dL (ref 0.3–1.2)
Total Protein: 7.1 g/dL (ref 6.5–8.1)

## 2020-08-15 MED ORDER — SODIUM CHLORIDE 0.9% FLUSH
10.0000 mL | INTRAVENOUS | Status: DC | PRN
  Administered 2020-08-15: 10 mL
  Filled 2020-08-15: qty 10

## 2020-08-15 MED ORDER — PACLITAXEL PROTEIN-BOUND CHEMO INJECTION 100 MG
112.5000 mg/m2 | Freq: Once | INTRAVENOUS | Status: AC
  Administered 2020-08-15: 175 mg via INTRAVENOUS
  Filled 2020-08-15: qty 35

## 2020-08-15 MED ORDER — SODIUM CHLORIDE 0.9 % IV SOLN
Freq: Once | INTRAVENOUS | Status: AC
  Filled 2020-08-15: qty 250

## 2020-08-15 MED ORDER — SODIUM CHLORIDE 0.9 % IV SOLN
900.0000 mg/m2 | Freq: Once | INTRAVENOUS | Status: AC
  Administered 2020-08-15: 1444 mg via INTRAVENOUS
  Filled 2020-08-15: qty 26.3

## 2020-08-15 MED ORDER — PROCHLORPERAZINE MALEATE 10 MG PO TABS
10.0000 mg | ORAL_TABLET | Freq: Once | ORAL | Status: AC
  Administered 2020-08-15: 10 mg via ORAL

## 2020-08-15 MED ORDER — HEPARIN SOD (PORK) LOCK FLUSH 100 UNIT/ML IV SOLN
500.0000 [IU] | Freq: Once | INTRAVENOUS | Status: AC | PRN
  Administered 2020-08-15: 500 [IU]
  Filled 2020-08-15: qty 5

## 2020-08-15 NOTE — Patient Instructions (Signed)
[facility]  Discharge Instructions: Thank you for choosing [facility] to provide your oncology and hematology care.   If you have a lab appointment with the [facility], please go directly to the [facility] and check in at the registration area.  Wear comfortable clothing and clothing appropriate for easy access to any Portacath or PICC line.   We strive to give you quality time with your provider. You may need to reschedule your appointment if you arrive late (15 or more minutes).  Arriving late affects you and other patients whose appointments are after yours.  Also, if you miss three or more appointments without notifying the office, you may be dismissed from the clinic at the provider's discretion.      For prescription refill requests, have your pharmacy contact our office and allow 72 hours for refills to be completed.    Today you received the following chemotherapy and/or immunotherapy agents Gemzar and Abraxane      To help prevent nausea and vomiting after your treatment, we encourage you to take your nausea medication as directed.  BELOW ARE SYMPTOMS THAT SHOULD BE REPORTED IMMEDIATELY: *FEVER GREATER THAN 100.4 F (38 C) OR HIGHER *CHILLS OR SWEATING *NAUSEA AND VOMITING THAT IS NOT CONTROLLED WITH YOUR NAUSEA MEDICATION *UNUSUAL SHORTNESS OF BREATH *UNUSUAL BRUISING OR BLEEDING *URINARY PROBLEMS (pain or burning when urinating, or frequent urination) *BOWEL PROBLEMS (unusual diarrhea, constipation, pain near the anus) TENDERNESS IN MOUTH AND THROAT WITH OR WITHOUT PRESENCE OF ULCERS (sore throat, sores in mouth, or a toothache) UNUSUAL RASH, SWELLING OR PAIN  UNUSUAL VAGINAL DISCHARGE OR ITCHING   Items with * indicate a potential emergency and should be followed up as soon as possible or go to the Emergency Department if any problems should occur.  Please show the CHEMOTHERAPY ALERT CARD or IMMUNOTHERAPY ALERT CARD at  check-in to the Emergency Department and triage nurse. Should you have questions after your visit or need to cancel or reschedule your appointment, please contact [facility]  [phone] and follow the prompts.  Office hours are 8:00 a.m. to 4:30 p.m. Monday - Friday. Please note that voicemails left after 4:00 p.m. may not be returned until the following business day.  We are closed weekends and major holidays. You have access to a nurse at all times for urgent questions. Please call the main number to the clinic [phone] and follow the prompts.  For any non-urgent questions, you may also contact your provider using MyChart. We now offer e-Visits for anyone 26 and older to request care online for non-urgent symptoms. For details visit [email or web address].   Also download the MyChart app! Go to the app store, search "MyChart", open the app, select [facility], and log in with your MyChart username and password.  Due to Covid, a mask is required upon entering the hospital/clinic. If you do not have a mask, one will be given to you upon arrival. For doctor visits, patients may have 1 support person aged 4 or older with them. For treatment visits, patients cannot have anyone with them due to current Covid guidelines and our immunocompromised population.

## 2020-08-15 NOTE — Patient Instructions (Signed)
Implanted Port Home Guide An implanted port is a device that is placed under the skin. It is usually placed in the chest. The device can be used to give IV medicine, to take blood, or for dialysis. You may have an implanted port if: You need IV medicine that would be irritating to the small veins in your hands or arms. You need IV medicines, such as antibiotics, for a long period of time. You need IV nutrition for a long period of time. You need dialysis. When you have a port, your health care provider can choose to use the port instead of veins in your arms for these procedures. You may have fewer limitations when using a port than you would if you used other types of long-term IVs, and you will likely be able to return to normal activities afteryour incision heals. An implanted port has two main parts: Reservoir. The reservoir is the part where a needle is inserted to give medicines or draw blood. The reservoir is round. After it is placed, it appears as a small, raised area under your skin. Catheter. The catheter is a thin, flexible tube that connects the reservoir to a vein. Medicine that is inserted into the reservoir goes into the catheter and then into the vein. How is my port accessed? To access your port: A numbing cream may be placed on the skin over the port site. Your health care provider will put on a mask and sterile gloves. The skin over your port will be cleaned carefully with a germ-killing soap and allowed to dry. Your health care provider will gently pinch the port and insert a needle into it. Your health care provider will check for a blood return to make sure the port is in the vein and is not clogged. If your port needs to remain accessed to get medicine continuously (constant infusion), your health care provider will place a clear bandage (dressing) over the needle site. The dressing and needle will need to be changed every week, or as told by your health care provider. What  is flushing? Flushing helps keep the port from getting clogged. Follow instructions from your health care provider about how and when to flush the port. Ports are usually flushed with saline solution or a medicine called heparin. The need for flushing will depend on how the port is used: If the port is only used from time to time to give medicines or draw blood, the port may need to be flushed: Before and after medicines have been given. Before and after blood has been drawn. As part of routine maintenance. Flushing may be recommended every 4-6 weeks. If a constant infusion is running, the port may not need to be flushed. Throw away any syringes in a disposal container that is meant for sharp items (sharps container). You can buy a sharps container from a pharmacy, or you can make one by using an empty hard plastic bottle with a cover. How long will my port stay implanted? The port can stay in for as long as your health care provider thinks it is needed. When it is time for the port to come out, a surgery will be done to remove it. The surgery will be similar to the procedure that was done to putthe port in. Follow these instructions at home:  Flush your port as told by your health care provider. If you need an infusion over several days, follow instructions from your health care provider about how to take   care of your port site. Make sure you: Wash your hands with soap and water before you change your dressing. If soap and water are not available, use alcohol-based hand sanitizer. Change your dressing as told by your health care provider. Place any used dressings or infusion bags into a plastic bag. Throw that bag in the trash. Keep the dressing that covers the needle clean and dry. Do not get it wet. Do not use scissors or sharp objects near the tube. Keep the tube clamped, unless it is being used. Check your port site every day for signs of infection. Check for: Redness, swelling, or  pain. Fluid or blood. Pus or a bad smell. Protect the skin around the port site. Avoid wearing bra straps that rub or irritate the site. Protect the skin around your port from seat belts. Place a soft pad over your chest if needed. Bathe or shower as told by your health care provider. The site may get wet as long as you are not actively receiving an infusion. Return to your normal activities as told by your health care provider. Ask your health care provider what activities are safe for you. Carry a medical alert card or wear a medical alert bracelet at all times. This will let health care providers know that you have an implanted port in case of an emergency. Get help right away if: You have redness, swelling, or pain at the port site. You have fluid or blood coming from your port site. You have pus or a bad smell coming from the port site. You have a fever. Summary Implanted ports are usually placed in the chest for long-term IV access. Follow instructions from your health care provider about flushing the port and changing bandages (dressings). Take care of the area around your port by avoiding clothing that puts pressure on the area, and by watching for signs of infection. Protect the skin around your port from seat belts. Place a soft pad over your chest if needed. Get help right away if you have a fever or you have redness, swelling, pain, drainage, or a bad smell at the port site. This information is not intended to replace advice given to you by your health care provider. Make sure you discuss any questions you have with your healthcare provider. Document Revised: 05/08/2019 Document Reviewed: 05/08/2019 Elsevier Patient Education  2022 [employer] Inc.  

## 2020-08-16 ENCOUNTER — Other Ambulatory Visit: Payer: Self-pay | Admitting: *Deleted

## 2020-08-16 DIAGNOSIS — C787 Secondary malignant neoplasm of liver and intrahepatic bile duct: Secondary | ICD-10-CM

## 2020-08-16 DIAGNOSIS — C259 Malignant neoplasm of pancreas, unspecified: Secondary | ICD-10-CM

## 2020-08-19 ENCOUNTER — Encounter: Payer: Self-pay | Admitting: Hematology & Oncology

## 2020-08-20 ENCOUNTER — Encounter: Payer: Self-pay | Admitting: *Deleted

## 2020-08-20 ENCOUNTER — Other Ambulatory Visit: Payer: Self-pay | Admitting: Hematology & Oncology

## 2020-08-20 ENCOUNTER — Inpatient Hospital Stay: Payer: Medicare Other | Admitting: Hematology & Oncology

## 2020-08-20 ENCOUNTER — Inpatient Hospital Stay: Payer: Medicare Other

## 2020-08-20 ENCOUNTER — Other Ambulatory Visit: Payer: Self-pay | Admitting: Family Medicine

## 2020-08-21 ENCOUNTER — Other Ambulatory Visit: Payer: Self-pay | Admitting: *Deleted

## 2020-08-21 MED ORDER — FENTANYL 50 MCG/HR TD PT72: 1.0000 | MEDICATED_PATCH | TRANSDERMAL | 0 refills | Status: DC

## 2020-08-22 ENCOUNTER — Other Ambulatory Visit: Payer: Self-pay

## 2020-08-22 ENCOUNTER — Ambulatory Visit (INDEPENDENT_AMBULATORY_CARE_PROVIDER_SITE_OTHER): Payer: Medicare Other | Admitting: Family Medicine

## 2020-08-22 DIAGNOSIS — Z789 Other specified health status: Secondary | ICD-10-CM

## 2020-08-22 DIAGNOSIS — E1159 Type 2 diabetes mellitus with other circulatory complications: Secondary | ICD-10-CM

## 2020-08-22 DIAGNOSIS — E1142 Type 2 diabetes mellitus with diabetic polyneuropathy: Secondary | ICD-10-CM

## 2020-08-22 DIAGNOSIS — C259 Malignant neoplasm of pancreas, unspecified: Secondary | ICD-10-CM

## 2020-08-22 DIAGNOSIS — I10 Essential (primary) hypertension: Secondary | ICD-10-CM

## 2020-08-22 DIAGNOSIS — C787 Secondary malignant neoplasm of liver and intrahepatic bile duct: Secondary | ICD-10-CM

## 2020-08-22 MED ORDER — HYOSCYAMINE SULFATE 0.125 MG SL SUBL: 0.1250 mg | SUBLINGUAL_TABLET | SUBLINGUAL | 1 refills | Status: DC | PRN

## 2020-08-22 MED ORDER — MUPIROCIN 2 % EX OINT: 1.0000 "application " | TOPICAL_OINTMENT | Freq: Every day | CUTANEOUS | 1 refills | Status: DC

## 2020-08-22 NOTE — Patient Instructions (Signed)
Encouraged increased hydration and fiber in diet. Daily probiotics. If bowels not moving can use MOM 2 tbls po in 4 oz of warm prune juice by mouth every 2-3 days. If no results then repeat in 4 hours with  Dulcolax suppository pr, may repeat again in 4 more hours as needed. Seek care if symptoms worsen. Consider daily Miralax and/or Dulcolax if symptoms persist.    For now increase Benefiber to twice daily then to 3 x daily as needed and consider adding 1/2 dose of Miralax to Benefiber once daily

## 2020-08-22 NOTE — Progress Notes (Signed)
Patient ID: Dean Singh, male    DOB: 1960/05/20  Age: 60 y.o. MRN: [id number]    Subjective:  Subjective  HPI Dean Singh presents for office visit today for follow up on recent CA dx and epistaxis. He reports that he has been experiencing nosebleeds mainly in the right nostril. As a result, he stopped taking his blood thinners then his nosebleeds have improved. He then restarted his blood thinners then the nosebleeds got worse again. Denies CP/palp/SOB/HA/congestion/fevers or GU c/o. Taking meds as prescribed.  He reports experiencing diarrhea alternating with constipation. He describes his stool as light in color and oily. He states that he experiences diarrhea and constipation everyday. Endorses adding benefiber once a day. He still has RLQ pain that he describes as sharp.   Review of Systems  Constitutional:  Negative for chills, fatigue and fever.  HENT:  Positive for nosebleeds. Negative for congestion, rhinorrhea, sinus pressure, sinus pain, sore throat and trouble swallowing.   Eyes:  Negative for pain.  Respiratory:  Negative for cough and shortness of breath.   Cardiovascular:  Negative for chest pain, palpitations and leg swelling.  Gastrointestinal:  Positive for abdominal pain, constipation and diarrhea. Negative for blood in stool, nausea and vomiting.  Genitourinary:  Negative for flank pain, frequency and penile pain.  Musculoskeletal:  Negative for back pain.  Allergic/Immunologic: Positive for immunocompromised state.  Neurological:  Negative for headaches.  Psychiatric/Behavioral:  Positive for sleep disturbance.    History Past Medical History:  Diagnosis Date   Arthritis    DDD   Bladder cancer ([place])    Bladder cancer ([place]) 08/17/2018   CAD in native artery 08/17/2018   Current nicotine use 08/17/2018   Diabetes mellitus without complication (HCC)    GERD (gastroesophageal reflux disease)    Goals of care, counseling/discussion 04/24/2020   H/O: stroke     Heart attack ([place])    2 stents   Hx of hemorrhoids 08/17/2018   Hyperlipidemia    Hypertension    Pancreatic cancer metastasized to liver ([place]) 04/24/2020   Peripheral neuropathy 08/17/2018   Stroke ([place]) 57   Visual changes 08/17/2018    He has a past surgical history that includes Carotid endarterectomy (Left); Coronary angioplasty with stent; Hernia repair; Bladder tumor excision; LEFT HEART CATH AND CORONARY ANGIOGRAPHY (N/A, 09/02/2018); CORONARY STENT INTERVENTION (N/A, 09/02/2018); ABDOMINAL AORTOGRAM W/LOWER EXTREMITY (Bilateral, 02/08/2020); Upper esophageal endoscopic ultrasound (eus) (N/A, 05/02/2020); Fine needle aspiration (N/A, 05/02/2020); Esophagogastroduodenoscopy (egd) with propofol (N/A, 05/02/2020); and IR IMAGING GUIDED PORT INSERTION (05/15/2020).   His family history includes Addison's disease in his sister; Arthritis in his father and mother; COPD in his mother; Dementia in his maternal grandfather, maternal grandmother, and mother; Diabetes in his brother, father, maternal grandfather, maternal grandmother, mother, and sister; Heart disease in his father, maternal grandfather, maternal grandmother, and mother; Hyperlipidemia in his brother, father, maternal grandfather, maternal grandmother, and sister; Hypertension in his brother and sister; Obesity in his brother, brother, and sister; Osteoporosis in his father and mother; Other in his paternal grandmother; Parkinson's disease in his father; Prostate cancer in his paternal grandfather; Rheumatic fever in his son; Stickler syndrome in his son.He reports that he has been smoking cigarettes. He has been smoking an average of 1 pack per day. He has quit using smokeless tobacco.  His smokeless tobacco use included chew. He reports that he does not currently use alcohol. He reports that he does not currently use drugs.  Current Outpatient Medications on File Prior  to Visit  Medication Sig Dispense Refill   aspirin EC 81 MG tablet Take 1  tablet (81 mg total) by mouth daily.     cilostazol (PLETAL) 100 MG tablet TAKE 1 TABLET BY MOUTH TWICE A DAY 180 tablet 1   clopidogrel (PLAVIX) 75 MG tablet TAKE 1 TABLET BY MOUTH EVERY DAY (Patient taking differently: Take 75 mg by mouth daily.) 90 tablet 3   Continuous Blood Gluc Receiver (FREESTYLE LIBRE 14 DAY READER) DEVI Use Freestyle Libre meter to monitor blood sugars. DX:E11.9 1 each 0   Continuous Blood Gluc Sensor (FREESTYLE LIBRE 14 DAY SENSOR) MISC Apply one sensor once every 14 days to monitor blood sugars. DX:E11.9 2 each 2   dronabinol (MARINOL) 5 MG capsule TAKE 1 CAPSULE (5 MG TOTAL) BY MOUTH 2 (TWO) TIMES DAILY BEFORE LUNCH AND SUPPER. 60 capsule 0   ezetimibe (ZETIA) 10 MG tablet TAKE 1 TABLET BY MOUTH EVERY DAY (Patient taking differently: Take 10 mg by mouth daily.) 90 tablet 3   famotidine (PEPCID) 40 MG tablet TAKE 1 TABLET BY MOUTH EVERY DAY 90 tablet 1   FARXIGA 5 MG TABS tablet TAKE 1 TABLET BY MOUTH DAILY BEFORE BREAKFAST. 30 tablet 2   fentaNYL (DURAGESIC) 50 MCG/HR Place 1 patch onto the skin every 3 (three) days. 10 patch 0   glucose blood (ONETOUCH ULTRA) test strip USE TWICE DAILY AS DIRECTED ( E11.65) 200 each 12   HYDROcodone-acetaminophen (NORCO) 5-325 MG tablet Take 1-2 tablets by mouth every 6 (six) hours as needed for moderate pain or severe pain. 60 tablet 0   Insulin Glargine (BASAGLAR KWIKPEN) 100 UNIT/ML Inject 20 Units into the skin daily. 30 mL 4   Insulin Pen Needle 32G X 4 MM MISC 1 Device by Does not apply route daily. 50 each 6   JANUVIA 100 MG tablet TAKE 1 TABLET BY MOUTH EVERY DAY 90 tablet 1   lidocaine-prilocaine (EMLA) cream APPLY TO AFFECTED AREA ONCE AS DIRECTED 30 g 3   loperamide (IMODIUM) 2 MG capsule TAKE 2 AT ONSET OF DIARRHEA, THEN 1 EVERY 2 HOURS UNTIL 12 HOURS WITHOUT A BOWEL MOEVENT. MAY TAKE 2 TAB EVERY 4 HOURS AT BEDTIME. IF DIARRHEA RECURS REPEAT. 100 capsule 1   metFORMIN (GLUCOPHAGE) 1000 MG tablet Take 1 tablet (1,000 mg  total) by mouth daily with breakfast. 90 tablet 3   nitroGLYCERIN (NITROSTAT) 0.4 MG SL tablet Place 0.4 mg under the tongue every 5 (five) minutes as needed for chest pain.     ondansetron (ZOFRAN) 4 MG tablet Take 1 tablet (4 mg total) by mouth every 8 (eight) hours as needed for nausea or vomiting. 40 tablet 0   ondansetron (ZOFRAN) 8 MG tablet Take 1 tablet (8 mg total) by mouth 2 (two) times daily as needed (Nausea or vomiting). 30 tablet 1   pantoprazole (PROTONIX) 40 MG tablet Take 1 tablet (40 mg total) by mouth daily. 90 tablet 3   pregabalin (LYRICA) 300 MG capsule TAKE 1 CAPSULE BY MOUTH EVERYDAY AT BEDTIME 30 capsule 2   prochlorperazine (COMPAZINE) 10 MG tablet Take 1 tablet (10 mg total) by mouth every 6 (six) hours as needed (Nausea or vomiting). 30 tablet 1   rosuvastatin (CRESTOR) 5 MG tablet TAKE 1 TABLET (5 MG TOTAL) BY MOUTH DAILY. 90 tablet 0   Saw Palmetto, Serenoa repens, (SAW PALMETTO PO) Take 1 capsule by mouth daily as needed (When having prostate problems).      sucralfate (CARAFATE) 1 GM/10ML suspension TAKE  10 MLS (1 G TOTAL) BY MOUTH 4 (FOUR) TIMES DAILY - WITH MEALS AND AT BEDTIME. 420 mL 1   SUMAtriptan (IMITREX) 25 MG tablet TAKE 1 TABLET BY MOUTH EVERY 2 HOURS AS NEEDED FOR MIGRAINE. 12 tablet 2   Wheat Dextrin (BENEFIBER PO) Take 1 Scoop by mouth daily.     LORazepam (ATIVAN) 0.5 MG tablet Take 1 tablet (0.5 mg total) by mouth every 6 (six) hours as needed (Nausea or vomiting). 30 tablet 0   No current facility-administered medications on file prior to visit.     Objective:  Objective  Physical Exam Constitutional:      General: He is not in acute distress.    Appearance: Normal appearance. He is not ill-appearing or toxic-appearing.  HENT:     Head: Normocephalic and atraumatic.     Right Ear: Tympanic membrane, ear canal and external ear normal.     Left Ear: Tympanic membrane, ear canal and external ear normal.     Nose: No congestion or rhinorrhea.   Eyes:     Extraocular Movements: Extraocular movements intact.     Pupils: Pupils are equal, round, and reactive to light.  Cardiovascular:     Rate and Rhythm: Normal rate and regular rhythm.     Pulses: Normal pulses.     Heart sounds: Normal heart sounds. No murmur heard. Pulmonary:     Effort: Pulmonary effort is normal. No respiratory distress.     Breath sounds: Normal breath sounds. No wheezing, rhonchi or rales.  Abdominal:     General: Bowel sounds are normal.     Palpations: Abdomen is soft. There is no mass.     Tenderness: no abdominal tenderness There is no guarding.     Hernia: No hernia is present.  Musculoskeletal:        General: Normal range of motion.     Cervical back: Normal range of motion and neck supple.  Skin:    General: Skin is warm and dry.  Neurological:     Mental Status: He is alert and oriented to person, place, and time.  Psychiatric:        Behavior: Behavior normal.   BP 122/68   Pulse 81   Temp 98 F (36.7 C)   Resp 16   Wt 132 lb (59.9 kg)   SpO2 98%   BMI 23.38 kg/m  Wt Readings from Last 3 Encounters:  08/22/20 132 lb (59.9 kg)  08/15/20 132 lb (59.9 kg)  07/24/20 129 lb (58.5 kg)     Lab Results  Component Value Date   WBC 5.7 08/15/2020   HGB 12.8 (L) 08/15/2020   HCT 37.6 (L) 08/15/2020   PLT 128 (L) 08/15/2020   GLUCOSE 214 (H) 08/15/2020   CHOL 130 04/01/2020   TRIG 217.0 (H) 04/01/2020   HDL 39.20 04/01/2020   LDLDIRECT 62.0 04/01/2020   LDLCALC 93 05/01/2019   ALT 71 (H) 08/15/2020   AST 53 (H) 08/15/2020   NA 134 (L) 08/15/2020   K 4.5 08/15/2020   CL 100 08/15/2020   CREATININE 0.62 08/15/2020   BUN 13 08/15/2020   CO2 25 08/15/2020   TSH 1.05 04/01/2020   PSA 0.19 08/11/2019   INR 1.0 05/15/2020   HGBA1C 10.5 (H) 04/01/2020   MICROALBUR 2.1 08/11/2019    CT CHEST ABDOMEN PELVIS W CONTRAST  Result Date: 07/24/2020 CLINICAL DATA:  Pancreatic cancer.  Restaging. EXAM: CT CHEST, ABDOMEN, AND PELVIS  WITH CONTRAST TECHNIQUE: Multidetector CT imaging  of the chest, abdomen and pelvis was performed following the standard protocol during bolus administration of intravenous contrast. CONTRAST:  57m OMNIPAQUE IOHEXOL 300 MG/ML  SOLN COMPARISON:  Chest CT 04/29/2020.  Abdomen/pelvis CT 04/15/2020. FINDINGS: CT CHEST FINDINGS Cardiovascular: The heart size is normal. No substantial pericardial effusion. Mild atherosclerotic calcification is noted in the wall of the thoracic aorta. Coronary artery calcification is evident. Right Port-A-Cath tip is positioned in the right atrium. Mediastinum/Nodes: No mediastinal lymphadenopathy. There is no hilar lymphadenopathy. The esophagus has normal imaging features. There is no axillary lymphadenopathy. Lungs/Pleura: Centrilobular and paraseptal emphysema evident. Scattered calcified granulomata are noted in the lungs bilaterally. No overtly suspicious nodule or mass. No focal airspace consolidation. No pleural effusion. Musculoskeletal: No worrisome lytic or sclerotic osseous abnormality. CT ABDOMEN PELVIS FINDINGS Hepatobiliary: Multiple ill-defined hypoattenuating liver lesions are compatible with metastatic involvement. Lateral segment left liver lesion measuring 1.2 x 1.0 cm on 54/2 today was 1.8 x 1.7 cm previously. 1.8 x 1.5 cm anterior right liver lesion on 63/2 was 2.4 x 2.1 cm previously. 1.2 x 0.7 cm posterior right hepatic lesion on 52/2 was 1.5 x 1.0 cm (remeasured) previously. There is no evidence for gallstones, gallbladder wall thickening, or pericholecystic fluid. No intrahepatic or extrahepatic biliary dilation. Pancreas: Ill-defined hypoenhancing lesion in the tail of pancreas measures 5.0 x 3.4 cm today compared to 5.1 x 3.3 cm previously, not substantially changed. 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 Spleen: Heterogeneous perfusion of splenic parenchyma evident. Adrenals/Urinary Tract: No adrenal nodule  or mass. Vascular calcification noted in the hilum of both kidneys with possible punctate nonobstructing stone in the posterior lower interpolar left kidney. No evidence for hydroureter. The urinary bladder appears normal for the degree of distention. Stomach/Bowel: Stomach is unremarkable. No gastric wall thickening. No evidence of outlet obstruction. Duodenum is normally positioned as is the ligament of Treitz. No small bowel wall thickening. No small bowel dilatation. The terminal ileum is normal. The appendix is normal. Large stool volume throughout the length of the colon. Vascular/Lymphatic: There is moderate atherosclerotic calcification of the abdominal aorta without aneurysm. There is no gastrohepatic or hepatoduodenal ligament lymphadenopathy. No retroperitoneal or mesenteric lymphadenopathy. No pelvic sidewall lymphadenopathy. Reproductive: The prostate gland and seminal vesicles are unremarkable. Other: No intraperitoneal free fluid. Musculoskeletal: No worrisome lytic or sclerotic osseous abnormality. IMPRESSION: 1. No substantial interval change in size of the ill-defined hypoenhancing lesion in the tail of pancreas. 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 2. Interval mild decrease in size of multiple ill-defined hypoattenuating liver lesions compatible with metastatic involvement. 3. No evidence for metastatic disease in the chest. 4. Large stool volume throughout the length of the colon. Imaging features could be compatible with constipation in the appropriate clinical setting. 5. Aortic Atherosclerosis (ICD10-I70.0) and Emphysema (ICD10-J43.9). Electronically Signed   By: EMisty StanleyM.D.   On: 07/24/2020 05:48     Assessment & Plan:  Plan    Meds ordered this encounter  Medications   mupirocin ointment (BACTROBAN) 2 %    Sig: Place 1 application into the nose at bedtime.    Dispense:  22 g    Refill:  1   hyoscyamine (LEVSIN  SL) 0.125 MG SL tablet    Sig: Place 1 tablet (0.125 mg total) under the tongue every 4 (four) hours as needed.    Dispense:  20 tablet    Refill:  1    Problem List Items Addressed This Visit     Pancreatic cancer metastasized to liver [facility]) (Chronic)    He is struggling with diagnosis and is following closely with oncology. He declines any meds to manage stress or anxiety at this time      Essential hypertension    Well controlled, no changes to meds. Encouraged heart healthy diet such as the DASH diet and exercise as tolerated.       Statin intolerance    Doing       Type 2 diabetes mellitus with diabetic polyneuropathy, without long-term current use of insulin (HCC)    hgba1c acceptable, minimize simple carbs. Increase exercise as tolerated. Continue current meds      RESOLVED: Diabetes mellitus ([place])    Follow-up: Return in about 6 months (around 02/22/2021), or VV.  I, Suezanne Jacquet, acting as a scribe for Penni Homans, MD, have documented all relevent documentation on behalf of Penni Homans, MD, as directed by Penni Homans, MD while in the presence of Penni Homans, MD.  I, Mosie Lukes, MD personally performed the services described in this documentation. All medical record entries made by the scribe were at my direction and in my presence. I have reviewed the chart and agree that the record reflects my personal performance and is accurate and complete

## 2020-08-23 ENCOUNTER — Other Ambulatory Visit: Payer: Self-pay | Admitting: Family

## 2020-08-23 DIAGNOSIS — E119 Type 2 diabetes mellitus without complications: Secondary | ICD-10-CM

## 2020-08-25 NOTE — Assessment & Plan Note (Deleted)
hgba1c acceptable, minimize simple carbs. Increase exercise as tolerated. Continue current meds 

## 2020-08-25 NOTE — Assessment & Plan Note (Signed)
hgba1c acceptable, minimize simple carbs. Increase exercise as tolerated. Continue current meds 

## 2020-08-25 NOTE — Assessment & Plan Note (Signed)
Well controlled, no changes to meds. Encouraged heart healthy diet such as the DASH diet and exercise as tolerated.  °

## 2020-08-25 NOTE — Assessment & Plan Note (Signed)
He is struggling with diagnosis and is following closely with oncology. He declines any meds to manage stress or anxiety at this time

## 2020-08-25 NOTE — Assessment & Plan Note (Signed)
Doing

## 2020-08-27 ENCOUNTER — Inpatient Hospital Stay: Payer: Medicare Other

## 2020-08-28 ENCOUNTER — Encounter: Payer: Self-pay | Admitting: *Deleted

## 2020-08-28 ENCOUNTER — Telehealth (INDEPENDENT_AMBULATORY_CARE_PROVIDER_SITE_OTHER): Payer: Medicare Other | Admitting: Family Medicine

## 2020-08-28 ENCOUNTER — Encounter: Payer: Self-pay | Admitting: Family Medicine

## 2020-08-28 ENCOUNTER — Telehealth: Payer: Self-pay

## 2020-08-28 ENCOUNTER — Other Ambulatory Visit: Payer: Self-pay

## 2020-08-28 DIAGNOSIS — I10 Essential (primary) hypertension: Secondary | ICD-10-CM

## 2020-08-28 DIAGNOSIS — U071 COVID-19: Secondary | ICD-10-CM | POA: Diagnosis not present

## 2020-08-28 MED ORDER — NIRMATRELVIR/RITONAVIR (PAXLOVID)TABLET: ORAL_TABLET | ORAL | 0 refills | Status: DC

## 2020-08-28 NOTE — Telephone Encounter (Signed)
Appt scheduled today with PCP.

## 2020-08-28 NOTE — Assessment & Plan Note (Signed)
Continue to monitor, no changes to meds. Encouraged heart healthy diet such as the DASH diet and exercise as tolerated.  

## 2020-08-28 NOTE — Assessment & Plan Note (Signed)
Patient is COVID positive and struggling with numerous symptoms including fevers and chills. Prescription is sent in for Paxlovid for patient to start. Fever 101, rigors, exhausted and achy, congestion in head but that is ongoing. No HA, CP,SOB, GI upset, sore throat. Need Pulse oximeter, want oxygen in 90s, if drops lower needs to go to ER for evaluation Take Multivitamin with minerals, selenium Vitamin D 1000-2000 IU daily Probiotic with lactobacillus and bifidophilus Fish oil cap daily

## 2020-08-28 NOTE — Progress Notes (Signed)
Patient called stating he has tested positive for Covid. He has already reached out to his PCP for anti-viral treatment. Encouraged patient to follow up with PCP. Also encouraged rest and fluids. Instructed him to go to the ED if his condition deteriorates.   He is scheduled for follow up and treatment tomorrow. This appointment will be cancelled. Spoke to Dr Marin Olp and he would like patient to be rescheduled  2 weeks from now. Message sent to scheduling.  Oncology Nurse Navigator Documentation  Oncology Nurse Navigator Flowsheets 08/28/2020  Abnormal Finding Date -  Confirmed Diagnosis Date -  Diagnosis Status -  Planned Course of Treatment -  Phase of Treatment -  Chemotherapy Actual Start Date: -  Navigator Follow Up Date: 09/17/2020  Navigator Follow Up Reason: Follow-up Appointment;Chemotherapy  Navigator Location [facility]  Referral Date to RadOnc/MedOnc -  Navigator Encounter Type Telephone  Telephone Symptom Mgt;Appt Confirmation/Clarification;Incoming Call  Treatment Initiated Date -  Patient Visit Type MedOnc  Treatment Phase Active Tx  Barriers/Navigation Needs Coordination of Care;Education  Education Other  Interventions Coordination of Care;Education;Psycho-Social Support  Acuity Level 2-Minimal Needs (1-2 Barriers Identified)  Coordination of Care Appts  Education Method Verbal  Support Groups/Services Friends and Family  Time Spent with Patient 30

## 2020-08-28 NOTE — Progress Notes (Signed)
MyChart Video Visit    Virtual Visit via Video Note   This visit type was conducted due to national recommendations for restrictions regarding the COVID-19 Pandemic (e.g. social distancing) in an effort to limit this patient's exposure and mitigate transmission in our community. This patient is at least at moderate risk for complications without adequate follow up. This format is felt to be most appropriate for this patient at this time. Physical exam was limited by quality of the video and audio technology used for the visit. S Chism, CMA was able to get the patient set up on a video visit.  Patient location: home Patient and provider in visit Provider location: Office  I discussed the limitations of evaluation and management by telemedicine and the availability of in person appointments. The patient expressed understanding and agreed to proceed.  Visit Date: 08/28/2020  Today's healthcare provider: Penni Homans, MD     Subjective:    Patient ID: Dean Singh. Aardema, male    DOB: 07/27/1960, 60 y.o.   MRN: [id number]  Chief Complaint  Patient presents with   Covid Positive    Symptoms started last     HPI Patient is in today for evaluation of new onset COVID. He began having Rigors, fevers and fatigue last night and this morning tested positive for COVID. He has some nasal congestion but this is his normal baseline. So far his only other new symptom are increased myalgias. Denies CP/palp/SOB/HA/GI or GU c/o. Taking meds as prescribed   Past Medical History:  Diagnosis Date   Arthritis    DDD   Bladder cancer ([place])    Bladder cancer ([place]) 08/17/2018   CAD in native artery 08/17/2018   Current nicotine use 08/17/2018   Diabetes mellitus without complication (HCC)    GERD (gastroesophageal reflux disease)    Goals of care, counseling/discussion 04/24/2020   H/O: stroke    Heart attack ([place])    2 stents   Hx of hemorrhoids 08/17/2018   Hyperlipidemia    Hypertension     Pancreatic cancer metastasized to liver ([place]) 04/24/2020   Peripheral neuropathy 08/17/2018   Stroke ([place]) 57   Visual changes 08/17/2018    Past Surgical History:  Procedure Laterality Date   ABDOMINAL AORTOGRAM W/LOWER EXTREMITY Bilateral 02/08/2020   Procedure: ABDOMINAL AORTOGRAM W/LOWER EXTREMITY;  Surgeon: Marty Heck, MD;  Location: [facility];  Service: Cardiovascular;  Laterality: Bilateral;   BLADDER TUMOR EXCISION     cancer   CAROTID ENDARTERECTOMY Left    CORONARY ANGIOPLASTY WITH STENT PLACEMENT     2 stent   CORONARY STENT INTERVENTION N/A 09/02/2018   Procedure: CORONARY STENT INTERVENTION;  Surgeon: Sherren Mocha, MD;  Location: [facility];  Service: Cardiovascular;  Laterality: N/A;   ESOPHAGOGASTRODUODENOSCOPY (EGD) WITH PROPOFOL N/A 05/02/2020   Procedure: ESOPHAGOGASTRODUODENOSCOPY (EGD) WITH PROPOFOL;  Surgeon: [name], MD;  Location: [facility];  Service: Endoscopy;  Laterality: N/A;   FINE NEEDLE ASPIRATION N/A 05/02/2020   Procedure: FINE NEEDLE ASPIRATION (FNA) LINEAR;  Surgeon: [name], MD;  Location: [facility];  Service: Endoscopy;  Laterality: N/A;   HERNIA REPAIR     right inguinal 2017   IR IMAGING GUIDED PORT INSERTION  05/15/2020   LEFT HEART CATH AND CORONARY ANGIOGRAPHY N/A 09/02/2018   Procedure: LEFT HEART CATH AND CORONARY ANGIOGRAPHY;  Surgeon: Sherren Mocha, MD;  Location: [facility];  Service: Cardiovascular;  Laterality: N/A;   UPPER ESOPHAGEAL ENDOSCOPIC ULTRASOUND (  EUS) N/A 05/02/2020   Procedure: UPPER ESOPHAGEAL ENDOSCOPIC ULTRASOUND (EUS);  Surgeon: [name], MD;  Location: [facility];  Service: Endoscopy;  Laterality: N/A;    Family History  Problem Relation Age of Onset   COPD Mother    Diabetes Mother    Dementia Mother    Heart disease Mother        chf   Arthritis Mother    Osteoporosis Mother        h/o cigarette smoking   Heart disease Father        stents 3 x 2,  pacer, chf   Parkinson's disease Father    Hyperlipidemia Father    Diabetes Father    Arthritis Father        DDD   Osteoporosis Father        h/o cigarette use   Addison's disease Sister    Diabetes Sister    Hyperlipidemia Sister    Hypertension Sister    Obesity Sister    Hyperlipidemia Brother    Hypertension Brother    Diabetes Brother    Obesity Brother    Rheumatic fever Son    Stickler syndrome Son    Diabetes Maternal Grandmother    Dementia Maternal Grandmother    Heart disease Maternal Grandmother        chf   Hyperlipidemia Maternal Grandmother    Diabetes Maternal Grandfather    Dementia Maternal Grandfather    Heart disease Maternal Grandfather        cad   Hyperlipidemia Maternal Grandfather    Other Paternal Grandmother        scarlet fever   Prostate cancer Paternal Grandfather    Obesity Brother     Social History   Socioeconomic History   Marital status: Married    Spouse name: Not on file   Number of children: Not on file   Years of education: Not on file   Highest education level: Not on file  Occupational History   Not on file  Tobacco Use   Smoking status: Every Day    Packs/day: 1.00    Types: Cigarettes   Smokeless tobacco: Former    Types: Nurse, children's Use: Never used  Substance and Sexual Activity   Alcohol use: Not Currently   Drug use: Not Currently   Sexual activity: Yes  Other Topics Concern   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     cilostazol (PLETAL) 100 MG tablet TAKE 1 TABLET BY MOUTH TWICE A DAY 180 tablet 1   clopidogrel (PLAVIX) 75 MG tablet TAKE 1 TABLET BY MOUTH EVERY DAY (Patient taking differently: Take 75 mg by mouth daily.) 90 tablet 3   Continuous Blood Gluc Receiver (FREESTYLE LIBRE 14 DAY READER) DEVI Use Freestyle Libre meter to monitor blood sugars. DX:E11.9 1 each 0  Continuous Blood Gluc Sensor (FREESTYLE LIBRE 14 DAY SENSOR) MISC Apply one sensor once every 14 days to monitor blood sugars. DX:E11.9 2 each 2   dronabinol (MARINOL) 5 MG capsule TAKE 1 CAPSULE (5 MG TOTAL) BY MOUTH 2 (TWO) TIMES DAILY BEFORE LUNCH AND SUPPER. 60 capsule 0   ezetimibe (ZETIA) 10 MG tablet TAKE 1 TABLET BY MOUTH EVERY DAY (Patient taking differently: Take 10 mg by mouth daily.) 90 tablet 3   famotidine (PEPCID) 40 MG tablet TAKE 1 TABLET BY MOUTH EVERY DAY 90 tablet 1   FARXIGA 5 MG TABS tablet TAKE 1 TABLET BY MOUTH DAILY BEFORE BREAKFAST. 30 tablet 2   fentaNYL (DURAGESIC) 50 MCG/HR Place 1 patch onto the skin every 3 (three) days. 10 patch 0   glucose blood (ONETOUCH ULTRA) test strip USE TWICE DAILY AS DIRECTED ( E11.65) 200 each 12   HYDROcodone-acetaminophen (NORCO) 5-325 MG tablet Take 1-2 tablets by mouth every 6 (six) hours as needed for moderate pain or severe pain. 60 tablet 0   hyoscyamine (LEVSIN SL) 0.125 MG SL tablet Place 1 tablet (0.125 mg total) under the tongue every 4 (four) hours as needed. 20 tablet 1   Insulin Glargine (BASAGLAR KWIKPEN) 100 UNIT/ML Inject 20 Units into the skin daily. 30 mL 4   Insulin Pen Needle 32G X 4 MM MISC 1 Device by Does not apply route daily. 50 each 6   JANUVIA 100 MG tablet TAKE 1 TABLET BY MOUTH EVERY DAY 90 tablet 1   lidocaine-prilocaine (EMLA) cream APPLY TO AFFECTED AREA ONCE AS DIRECTED 30 g 3   loperamide (IMODIUM) 2 MG capsule TAKE 2 AT ONSET OF DIARRHEA, THEN 1 EVERY 2 HOURS  UNTIL 12 HOURS WITHOUT A BOWEL MOEVENT. MAY TAKE 2 TAB EVERY 4 HOURS AT BEDTIME. IF DIARRHEA RECURS REPEAT. 100 capsule 1   LORazepam (ATIVAN) 0.5 MG tablet Take 1 tablet (0.5 mg total) by mouth every 6 (six) hours as needed (Nausea or vomiting). 30 tablet 0   metFORMIN (GLUCOPHAGE) 1000 MG tablet Take 1 tablet (1,000 mg total) by mouth daily with breakfast. 90 tablet 3   mupirocin ointment (BACTROBAN) 2 % Place 1 application into the nose at bedtime. 22 g 1   nitroGLYCERIN (NITROSTAT) 0.4 MG SL tablet Place 0.4 mg under the tongue every 5 (five) minutes as needed for chest pain.     ondansetron (ZOFRAN) 4 MG tablet Take 1 tablet (4 mg total) by mouth every 8 (eight) hours as needed for nausea or vomiting. 40 tablet 0   ondansetron (ZOFRAN) 8 MG tablet Take 1 tablet (8 mg total) by mouth 2 (two) times daily as needed (Nausea or vomiting). 30 tablet 1   pantoprazole (PROTONIX) 40 MG tablet Take 1 tablet (40 mg total) by mouth daily. 90 tablet 3   pregabalin (LYRICA) 300 MG capsule TAKE 1 CAPSULE BY MOUTH EVERYDAY AT BEDTIME 30 capsule 2   prochlorperazine (COMPAZINE) 10 MG tablet Take 1 tablet (10 mg total) by mouth every 6 (six) hours as needed (Nausea or vomiting). 30 tablet 1   rosuvastatin (CRESTOR) 5 MG tablet TAKE 1 TABLET (5 MG TOTAL) BY MOUTH DAILY. 90 tablet 0   Saw Palmetto, Serenoa repens, (SAW PALMETTO PO) Take 1 capsule by mouth daily as needed (When having prostate problems).      sucralfate (CARAFATE) 1 GM/10ML suspension TAKE 10 MLS (1 G TOTAL) BY MOUTH 4 (FOUR) TIMES DAILY - WITH MEALS AND AT BEDTIME. 420 mL 1   SUMAtriptan (IMITREX)  25 MG tablet TAKE 1 TABLET BY MOUTH EVERY 2 HOURS AS NEEDED FOR MIGRAINE. 12 tablet 2   Wheat Dextrin (BENEFIBER PO) Take 1 Scoop by mouth daily.     No facility-administered medications prior to visit.    Allergies  Allergen Reactions   Statins Itching    Atorvastatin cause "my skin to feel horrible and unbearable".   Morphine Nausea And Vomiting     "felt like chest was exploding" pt reports     Review of Systems  Constitutional:  Positive for chills, fever and malaise/fatigue.  HENT:  Negative for congestion.   Eyes:  Negative for blurred vision.  Respiratory:  Negative for shortness of breath.   Cardiovascular:  Negative for chest pain, palpitations and leg swelling.  Gastrointestinal:  Negative for abdominal pain, blood in stool and nausea.  Genitourinary:  Negative for dysuria and frequency.  Musculoskeletal:  Positive for joint pain and myalgias. Negative for falls.  Skin:  Negative for rash.  Neurological:  Negative for dizziness, loss of consciousness and headaches.  Endo/Heme/Allergies:  Negative for environmental allergies.  Psychiatric/Behavioral:  Negative for depression. The patient is not nervous/anxious.       Objective:    Physical Exam Constitutional:      General: He is not in acute distress.    Appearance: Normal appearance. He is not ill-appearing or toxic-appearing.  HENT:     Head: Normocephalic and atraumatic.     Right Ear: External ear normal.     Left Ear: External ear normal.     Nose: Nose normal.  Eyes:     General:        Right eye: No discharge.        Left eye: No discharge.  Pulmonary:     Effort: Pulmonary effort is normal.  Skin:    Findings: No rash.  Neurological:     Mental Status: He is alert and oriented to person, place, and time.  Psychiatric:        Behavior: Behavior normal.    There were no vitals taken for this visit. Wt Readings from Last 3 Encounters:  08/22/20 132 lb (59.9 kg)  08/15/20 132 lb (59.9 kg)  07/24/20 129 lb (58.5 kg)    Diabetic Foot Exam - Simple   No data filed    Lab Results  Component Value Date   WBC 5.7 08/15/2020   HGB 12.8 (L) 08/15/2020   HCT 37.6 (L) 08/15/2020   PLT 128 (L) 08/15/2020   GLUCOSE 214 (H) 08/15/2020   CHOL 130 04/01/2020   TRIG 217.0 (H) 04/01/2020   HDL 39.20 04/01/2020   LDLDIRECT 62.0 04/01/2020   LDLCALC  93 05/01/2019   ALT 71 (H) 08/15/2020   AST 53 (H) 08/15/2020   NA 134 (L) 08/15/2020   K 4.5 08/15/2020   CL 100 08/15/2020   CREATININE 0.62 08/15/2020   BUN 13 08/15/2020   CO2 25 08/15/2020   TSH 1.05 04/01/2020   PSA 0.19 08/11/2019   INR 1.0 05/15/2020   HGBA1C 10.5 (H) 04/01/2020   MICROALBUR 2.1 08/11/2019    Lab Results  Component Value Date   TSH 1.05 04/01/2020   Lab Results  Component Value Date   WBC 5.7 08/15/2020   HGB 12.8 (L) 08/15/2020   HCT 37.6 (L) 08/15/2020   MCV 95.2 08/15/2020   PLT 128 (L) 08/15/2020   Lab Results  Component Value Date   NA 134 (L) 08/15/2020   K 4.5 08/15/2020  CO2 25 08/15/2020   GLUCOSE 214 (H) 08/15/2020   BUN 13 08/15/2020   CREATININE 0.62 08/15/2020   BILITOT 0.9 08/15/2020   ALKPHOS 568 (H) 08/15/2020   AST 53 (H) 08/15/2020   ALT 71 (H) 08/15/2020   PROT 7.1 08/15/2020   ALBUMIN 3.9 08/15/2020   CALCIUM 9.9 08/15/2020   ANIONGAP 9 08/15/2020   GFR 91.28 04/01/2020   Lab Results  Component Value Date   CHOL 130 04/01/2020   Lab Results  Component Value Date   HDL 39.20 04/01/2020   Lab Results  Component Value Date   LDLCALC 93 05/01/2019   Lab Results  Component Value Date   TRIG 217.0 (H) 04/01/2020   Lab Results  Component Value Date   CHOLHDL 3 04/01/2020   Lab Results  Component Value Date   HGBA1C 10.5 (H) 04/01/2020       Assessment & Plan:   Problem List Items Addressed This Visit     Essential hypertension    Continue to monitor, no changes to meds. Encouraged heart healthy diet such as the DASH diet and exercise as tolerated.       COVID-19    Patient is COVID positive and struggling with numerous symptoms including fevers and chills. Prescription is sent in for Paxlovid for patient to start. Fever 101, rigors, exhausted and achy, congestion in head but that is ongoing. No HA, CP,SOB, GI upset, sore throat. Need Pulse oximeter, want oxygen in 90s, if drops lower needs to go  to ER for evaluation Take Multivitamin with minerals, selenium Vitamin D 1000-2000 IU daily Probiotic with lactobacillus and bifidophilus Fish oil cap daily      Relevant Medications   nirmatrelvir/ritonavir EUA (PAXLOVID) 20 x 150 MG & 10 x '100MG'$  TABS    I am having Jadrien L. Aker start on nirmatrelvir/ritonavir EUA. I am also having him maintain his aspirin EC, (Saw Palmetto, Serenoa repens, (SAW PALMETTO PO)), pantoprazole, nitroGLYCERIN, OneTouch Ultra, Insulin Pen Needle, FreeStyle Libre 14 Day Sensor, [employer] 14 Day Reader, clopidogrel, ezetimibe, metFORMIN, famotidine, Basaglar KwikPen, ondansetron, Wheat Dextrin (BENEFIBER PO), cilostazol, Januvia, HYDROcodone-acetaminophen, Farxiga, pregabalin, prochlorperazine, ondansetron, lidocaine-prilocaine, LORazepam, SUMAtriptan, rosuvastatin, loperamide, sucralfate, dronabinol, fentaNYL, mupirocin ointment, and hyoscyamine.  Meds ordered this encounter  Medications   nirmatrelvir/ritonavir EUA (PAXLOVID) 20 x 150 MG & 10 x '100MG'$  TABS    Sig: (Take nirmatrelvir 150 mg two tablets twice daily for 5 days and ritonavir 100 mg one tablet twice daily for 5 days) Patient GFR is 0.6    Dispense:  30 tablet    Refill:  0    I discussed the assessment and treatment plan with the patient. The patient was provided an opportunity to ask questions and all were answered. The patient agreed with the plan and demonstrated an understanding of the instructions.   The patient was advised to call back or seek an in-person evaluation if the symptoms worsen or if the condition fails to improve as anticipated.  I provided 15 minutes of face-to-face time during this encounter.   Penni Homans, MD [facility] at [facility] [phone] (phone) [phone] (fax)  [place]

## 2020-08-29 ENCOUNTER — Encounter: Payer: Self-pay | Admitting: Family Medicine

## 2020-08-29 ENCOUNTER — Inpatient Hospital Stay: Payer: Medicare Other

## 2020-08-29 ENCOUNTER — Inpatient Hospital Stay: Payer: Medicare Other | Admitting: Hematology & Oncology

## 2020-08-30 ENCOUNTER — Telehealth: Payer: Medicaid Other | Admitting: Family Medicine

## 2020-08-31 ENCOUNTER — Encounter: Payer: Self-pay | Admitting: Family Medicine

## 2020-09-02 ENCOUNTER — Other Ambulatory Visit: Payer: Self-pay | Admitting: Family Medicine

## 2020-09-02 MED ORDER — DOXYCYCLINE HYCLATE 100 MG PO TABS: 100.0000 mg | ORAL_TABLET | Freq: Two times a day (BID) | ORAL | 0 refills | Status: DC

## 2020-09-04 ENCOUNTER — Inpatient Hospital Stay: Payer: Medicare Other

## 2020-09-10 ENCOUNTER — Ambulatory Visit: Payer: Medicaid Other | Admitting: Family Medicine

## 2020-09-11 ENCOUNTER — Inpatient Hospital Stay: Payer: Medicare Other

## 2020-09-17 ENCOUNTER — Inpatient Hospital Stay (HOSPITAL_BASED_OUTPATIENT_CLINIC_OR_DEPARTMENT_OTHER): Payer: Medicare Other | Admitting: Hematology & Oncology

## 2020-09-17 ENCOUNTER — Encounter: Payer: Self-pay | Admitting: *Deleted

## 2020-09-17 ENCOUNTER — Inpatient Hospital Stay: Payer: Medicare Other | Attending: Hematology & Oncology

## 2020-09-17 ENCOUNTER — Inpatient Hospital Stay: Payer: Medicare Other

## 2020-09-17 ENCOUNTER — Encounter: Payer: Self-pay | Admitting: Hematology & Oncology

## 2020-09-17 ENCOUNTER — Other Ambulatory Visit: Payer: Self-pay

## 2020-09-17 VITALS — BP 105/61 | HR 83 | Temp 98.4°F | Resp 18 | Wt 125.8 lb

## 2020-09-17 DIAGNOSIS — E1142 Type 2 diabetes mellitus with diabetic polyneuropathy: Secondary | ICD-10-CM | POA: Diagnosis not present

## 2020-09-17 DIAGNOSIS — C787 Secondary malignant neoplasm of liver and intrahepatic bile duct: Secondary | ICD-10-CM | POA: Insufficient documentation

## 2020-09-17 DIAGNOSIS — R197 Diarrhea, unspecified: Secondary | ICD-10-CM | POA: Insufficient documentation

## 2020-09-17 DIAGNOSIS — E119 Type 2 diabetes mellitus without complications: Secondary | ICD-10-CM | POA: Diagnosis not present

## 2020-09-17 DIAGNOSIS — R739 Hyperglycemia, unspecified: Secondary | ICD-10-CM

## 2020-09-17 DIAGNOSIS — E1165 Type 2 diabetes mellitus with hyperglycemia: Secondary | ICD-10-CM | POA: Diagnosis not present

## 2020-09-17 DIAGNOSIS — Z5111 Encounter for antineoplastic chemotherapy: Secondary | ICD-10-CM | POA: Insufficient documentation

## 2020-09-17 DIAGNOSIS — Z452 Encounter for adjustment and management of vascular access device: Secondary | ICD-10-CM | POA: Diagnosis not present

## 2020-09-17 DIAGNOSIS — C259 Malignant neoplasm of pancreas, unspecified: Secondary | ICD-10-CM | POA: Insufficient documentation

## 2020-09-17 DIAGNOSIS — C251 Malignant neoplasm of body of pancreas: Secondary | ICD-10-CM

## 2020-09-17 LAB — HEMOGLOBIN A1C
Hgb A1c MFr Bld: 9.3 % — ABNORMAL HIGH (ref 4.8–5.6)
Mean Plasma Glucose: 220.21 mg/dL

## 2020-09-17 LAB — CBC WITH DIFFERENTIAL (CANCER CENTER ONLY)
Abs Immature Granulocytes: 0.05 10*3/uL (ref 0.00–0.07)
Basophils Absolute: 0.1 10*3/uL (ref 0.0–0.1)
Basophils Relative: 1 %
Eosinophils Absolute: 0.4 10*3/uL (ref 0.0–0.5)
Eosinophils Relative: 4 %
HCT: 40.7 % (ref 39.0–52.0)
Hemoglobin: 13.5 g/dL (ref 13.0–17.0)
Immature Granulocytes: 1 %
Lymphocytes Relative: 27 %
Lymphs Abs: 2.4 10*3/uL (ref 0.7–4.0)
MCH: 32.3 pg (ref 26.0–34.0)
MCHC: 33.2 g/dL (ref 30.0–36.0)
MCV: 97.4 fL (ref 80.0–100.0)
Monocytes Absolute: 0.8 10*3/uL (ref 0.1–1.0)
Monocytes Relative: 9 %
Neutro Abs: 5.1 10*3/uL (ref 1.7–7.7)
Neutrophils Relative %: 58 %
Platelet Count: 145 10*3/uL — ABNORMAL LOW (ref 150–400)
RBC: 4.18 MIL/uL — ABNORMAL LOW (ref 4.22–5.81)
RDW: 15.6 % — ABNORMAL HIGH (ref 11.5–15.5)
WBC Count: 8.8 10*3/uL (ref 4.0–10.5)
nRBC: 0 % (ref 0.0–0.2)

## 2020-09-17 LAB — SAMPLE TO BLOOD BANK

## 2020-09-17 LAB — COMPREHENSIVE METABOLIC PANEL
ALT: 51 U/L — ABNORMAL HIGH (ref 0–44)
AST: 53 U/L — ABNORMAL HIGH (ref 15–41)
Albumin: 4.2 g/dL (ref 3.5–5.0)
Alkaline Phosphatase: 370 U/L — ABNORMAL HIGH (ref 38–126)
Anion gap: 9 (ref 5–15)
BUN: 15 mg/dL (ref 6–20)
CO2: 30 mmol/L (ref 22–32)
Calcium: 9.9 mg/dL (ref 8.9–10.3)
Chloride: 96 mmol/L — ABNORMAL LOW (ref 98–111)
Creatinine, Ser: 0.8 mg/dL (ref 0.61–1.24)
GFR, Estimated: >60 mL/min (ref >60)
Glucose, Bld: 467 mg/dL — ABNORMAL HIGH (ref 70–99)
Potassium: 4.7 mmol/L (ref 3.5–5.1)
Sodium: 135 mmol/L (ref 135–145)
Total Bilirubin: 0.9 mg/dL (ref 0.3–1.2)
Total Protein: 7.7 g/dL (ref 6.5–8.1)

## 2020-09-17 LAB — LACTATE DEHYDROGENASE: LDH: 204 U/L — ABNORMAL HIGH (ref 98–192)

## 2020-09-17 MED ORDER — SODIUM CHLORIDE 0.9% FLUSH
10.0000 mL | INTRAVENOUS | Status: DC | PRN
  Administered 2020-09-17: 10 mL

## 2020-09-17 MED ORDER — OXYCODONE HCL 20 MG/ML PO CONC: 10.0000 mg | ORAL | 0 refills | Status: DC | PRN

## 2020-09-17 MED ORDER — PROCHLORPERAZINE MALEATE 10 MG PO TABS
10.0000 mg | ORAL_TABLET | Freq: Once | ORAL | Status: AC
  Administered 2020-09-17: 10 mg via ORAL

## 2020-09-17 MED ORDER — DIPHENOXYLATE-ATROPINE 2.5-0.025 MG PO TABS: 2.0000 | ORAL_TABLET | Freq: Four times a day (QID) | ORAL | 0 refills | Status: DC | PRN

## 2020-09-17 MED ORDER — SODIUM CHLORIDE 0.9 % IV SOLN: Freq: Once | INTRAVENOUS | Status: AC

## 2020-09-17 MED ORDER — SODIUM CHLORIDE 0.9 % IV SOLN
800.0000 mg/m2 | Freq: Once | INTRAVENOUS | Status: AC
  Administered 2020-09-17: 1292 mg via INTRAVENOUS
  Filled 2020-09-17: qty 26.3

## 2020-09-17 MED ORDER — INSULIN ASPART 100 UNIT/ML IJ SOLN
20.0000 [IU] | Freq: Once | INTRAMUSCULAR | Status: AC
  Administered 2020-09-17: 20 [IU] via SUBCUTANEOUS
  Filled 2020-09-17: qty 0.2

## 2020-09-17 MED ORDER — HEPARIN SOD (PORK) LOCK FLUSH 100 UNIT/ML IV SOLN
500.0000 [IU] | Freq: Once | INTRAVENOUS | Status: AC | PRN
  Administered 2020-09-17: 500 [IU]

## 2020-09-17 MED ORDER — PACLITAXEL PROTEIN-BOUND CHEMO INJECTION 100 MG
101.2500 mg/m2 | Freq: Once | INTRAVENOUS | Status: AC
  Administered 2020-09-17: 175 mg via INTRAVENOUS
  Filled 2020-09-17: qty 35

## 2020-09-17 NOTE — Progress Notes (Signed)
Hematology and Oncology Follow Up Visit  Dean Singh [id number] 1960-10-21 60 y.o. 09/17/2020   Principle Diagnosis:  Metastatic adenocarcinoma the pancreas-hepatic metastasis  Current Therapy:   FOLFIRINOX-s/p cycle #2 - start on 05/20/2020 --d/c on 07/16/2020 due to toxicity Abraxane/Gemzar -- s/p cycle #1 -- start on 07/25/2020     Interim History:  Dean Singh is back for follow-up.  He is finally back after almost 2 months.  He had COVID.  He was not all that sick.  However, we have not been able to treat him for a while.  Despite not having treatment for such a long time, he still have a lot of diarrhea.  I suspect this is all from his diabetes.  I does have a hard time believing that his diarrhea is from treatment since he has not had treatment.  Blood sugars are horrible today.  His blood sugar is 467.  He really needs to have his blood sugars under better control.  I will give him some insulin in the office.  His last CA 19-9 has been coming down nicely.  I think it was down to 32,000.  I am sure that is quite higher now.   He continues to lose weight.  Again I think this is from his diabetes.  I will put him on some Lomotil to see if this might help with the diarrhea.  I am not sure he is still taking Marinol.  He has had no bleeding.  He has had no cough.  There is been no mouth sores.  Overall, his performance status is ECOG 2.     Medications:  Current Outpatient Medications:    aspirin EC 81 MG tablet, Take 1 tablet (81 mg total) by mouth daily., Disp:  , Rfl:    cilostazol (PLETAL) 100 MG tablet, TAKE 1 TABLET BY MOUTH TWICE A DAY, Disp: 180 tablet, Rfl: 1   clopidogrel (PLAVIX) 75 MG tablet, TAKE 1 TABLET BY MOUTH EVERY DAY (Patient taking differently: Take 75 mg by mouth daily.), Disp: 90 tablet, Rfl: 3   Continuous Blood Gluc Receiver (FREESTYLE LIBRE 14 DAY READER) DEVI, Use Freestyle Libre meter to monitor blood sugars. DX:E11.9, Disp: 1 each, Rfl: 0    Continuous Blood Gluc Sensor (FREESTYLE LIBRE 14 DAY SENSOR) MISC, Apply one sensor once every 14 days to monitor blood sugars. DX:E11.9, Disp: 2 each, Rfl: 2   dronabinol (MARINOL) 5 MG capsule, TAKE 1 CAPSULE (5 MG TOTAL) BY MOUTH 2 (TWO) TIMES DAILY BEFORE LUNCH AND SUPPER., Disp: 60 capsule, Rfl: 0   ezetimibe (ZETIA) 10 MG tablet, TAKE 1 TABLET BY MOUTH EVERY DAY (Patient taking differently: Take 10 mg by mouth daily.), Disp: 90 tablet, Rfl: 3   famotidine (PEPCID) 40 MG tablet, TAKE 1 TABLET BY MOUTH EVERY DAY, Disp: 90 tablet, Rfl: 1   FARXIGA 5 MG TABS tablet, TAKE 1 TABLET BY MOUTH DAILY BEFORE BREAKFAST., Disp: 30 tablet, Rfl: 2   fentaNYL (DURAGESIC) 50 MCG/HR, Place 1 patch onto the skin every 3 (three) days., Disp: 10 patch, Rfl: 0   glucose blood (ONETOUCH ULTRA) test strip, USE TWICE DAILY AS DIRECTED ( E11.65), Disp: 200 each, Rfl: 12   Insulin Glargine (BASAGLAR KWIKPEN) 100 UNIT/ML, Inject 20 Units into the skin daily., Disp: 30 mL, Rfl: 4   Insulin Pen Needle 32G X 4 MM MISC, 1 Device by Does not apply route daily., Disp: 50 each, Rfl: 6   JANUVIA 100 MG tablet, TAKE 1 TABLET BY MOUTH  EVERY DAY, Disp: 90 tablet, Rfl: 1   lidocaine-prilocaine (EMLA) cream, APPLY TO AFFECTED AREA ONCE AS DIRECTED, Disp: 30 g, Rfl: 3   metFORMIN (GLUCOPHAGE) 1000 MG tablet, Take 1 tablet (1,000 mg total) by mouth daily with breakfast., Disp: 90 tablet, Rfl: 3   pantoprazole (PROTONIX) 40 MG tablet, Take 1 tablet (40 mg total) by mouth daily., Disp: 90 tablet, Rfl: 3   pregabalin (LYRICA) 300 MG capsule, TAKE 1 CAPSULE BY MOUTH EVERYDAY AT BEDTIME, Disp: 30 capsule, Rfl: 2   rosuvastatin (CRESTOR) 5 MG tablet, TAKE 1 TABLET (5 MG TOTAL) BY MOUTH DAILY., Disp: 90 tablet, Rfl: 0   SUMAtriptan (IMITREX) 25 MG tablet, TAKE 1 TABLET BY MOUTH EVERY 2 HOURS AS NEEDED FOR MIGRAINE., Disp: 12 tablet, Rfl: 2   Wheat Dextrin (BENEFIBER PO), Take 1 Scoop by mouth daily., Disp: , Rfl:    doxycycline (VIBRA-TABS)  100 MG tablet, Take 1 tablet (100 mg total) by mouth 2 (two) times daily., Disp: 14 tablet, Rfl: 0   HYDROcodone-acetaminophen (NORCO) 5-325 MG tablet, Take 1-2 tablets by mouth every 6 (six) hours as needed for moderate pain or severe pain. (Patient not taking: Reported on 09/17/2020), Disp: 60 tablet, Rfl: 0   hyoscyamine (LEVSIN SL) 0.125 MG SL tablet, Place 1 tablet (0.125 mg total) under the tongue every 4 (four) hours as needed. (Patient not taking: Reported on 09/17/2020), Disp: 20 tablet, Rfl: 1   loperamide (IMODIUM) 2 MG capsule, TAKE 2 AT ONSET OF DIARRHEA, THEN 1 EVERY 2 HOURS UNTIL 12 HOURS WITHOUT A BOWEL MOEVENT. MAY TAKE 2 TAB EVERY 4 HOURS AT BEDTIME. IF DIARRHEA RECURS REPEAT. (Patient not taking: Reported on 09/17/2020), Disp: 100 capsule, Rfl: 1   LORazepam (ATIVAN) 0.5 MG tablet, Take 1 tablet (0.5 mg total) by mouth every 6 (six) hours as needed (Nausea or vomiting). (Patient not taking: Reported on 09/17/2020), Disp: 30 tablet, Rfl: 0   mupirocin ointment (BACTROBAN) 2 %, Place 1 application into the nose at bedtime., Disp: 22 g, Rfl: 1   nirmatrelvir/ritonavir EUA (PAXLOVID) 20 x 150 MG & 10 x '100MG'$  TABS, (Take nirmatrelvir 150 mg two tablets twice daily for 5 days and ritonavir 100 mg one tablet twice daily for 5 days) Patient GFR is 0.6, Disp: 30 tablet, Rfl: 0   nitroGLYCERIN (NITROSTAT) 0.4 MG SL tablet, Place 0.4 mg under the tongue every 5 (five) minutes as needed for chest pain. (Patient not taking: Reported on 09/17/2020), Disp: , Rfl:    ondansetron (ZOFRAN) 4 MG tablet, Take 1 tablet (4 mg total) by mouth every 8 (eight) hours as needed for nausea or vomiting. (Patient not taking: Reported on 09/17/2020), Disp: 40 tablet, Rfl: 0   ondansetron (ZOFRAN) 8 MG tablet, Take 1 tablet (8 mg total) by mouth 2 (two) times daily as needed (Nausea or vomiting). (Patient not taking: Reported on 09/17/2020), Disp: 30 tablet, Rfl: 1   prochlorperazine (COMPAZINE) 10 MG tablet, Take 1 tablet  (10 mg total) by mouth every 6 (six) hours as needed (Nausea or vomiting). (Patient not taking: Reported on 09/17/2020), Disp: 30 tablet, Rfl: 1   Saw Palmetto, Serenoa repens, (SAW PALMETTO PO), Take 1 capsule by mouth daily as needed (When having prostate problems). , Disp: , Rfl:    sucralfate (CARAFATE) 1 GM/10ML suspension, TAKE 10 MLS (1 G TOTAL) BY MOUTH 4 (FOUR) TIMES DAILY - WITH MEALS AND AT BEDTIME., Disp: 420 mL, Rfl: 1  Allergies:  Allergies  Allergen Reactions   Statins Itching  Atorvastatin cause "my skin to feel horrible and unbearable".   Morphine Nausea And Vomiting    "felt like chest was exploding" pt reports     Past Medical History, Surgical history, Social history, and Family History were reviewed and updated.  Review of Systems: Review of Systems  Constitutional:  Positive for fatigue.  HENT:  Negative.    Eyes: Negative.   Respiratory: Negative.    Cardiovascular: Negative.   Gastrointestinal:  Positive for abdominal pain.  Endocrine: Negative.   Musculoskeletal:  Positive for arthralgias, flank pain and myalgias.  Skin: Negative.   Neurological: Negative.   Hematological: Negative.   Psychiatric/Behavioral: Negative.     Physical Exam:  weight is 125 lb 12 oz (57 kg). His oral temperature is 98.4 F (36.9 C). His blood pressure is 105/61 and his pulse is 83. His respiration is 18 and oxygen saturation is 98%.   Wt Readings from Last 3 Encounters:  09/17/20 125 lb 12 oz (57 kg)  08/22/20 132 lb (59.9 kg)  08/15/20 132 lb (59.9 kg)    Physical Exam Vitals reviewed.  HENT:     Head: Normocephalic and atraumatic.  Eyes:     Pupils: Pupils are equal, round, and reactive to light.  Cardiovascular:     Rate and Rhythm: Normal rate and regular rhythm.     Heart sounds: Normal heart sounds.  Pulmonary:     Effort: Pulmonary effort is normal.     Breath sounds: Normal breath sounds.  Abdominal:     General: Bowel sounds are normal.      Palpations: Abdomen is soft.  Musculoskeletal:        General: No tenderness or deformity. Normal range of motion.     Cervical back: Normal range of motion.  Lymphadenopathy:     Cervical: No cervical adenopathy.  Skin:    General: Skin is warm and dry.     Findings: No erythema or rash.  Neurological:     Mental Status: He is alert and oriented to person, place, and time.  Psychiatric:        Behavior: Behavior normal.        Thought Content: Thought content normal.        Judgment: Judgment normal.     Lab Results  Component Value Date   WBC 8.8 09/17/2020   HGB 13.5 09/17/2020   HCT 40.7 09/17/2020   MCV 97.4 09/17/2020   PLT 145 (L) 09/17/2020     Chemistry      Component Value Date/Time   NA 135 09/17/2020 1203   NA 132 (L) 05/01/2019 1108   K 4.7 09/17/2020 1203   CL 96 (L) 09/17/2020 1203   CO2 30 09/17/2020 1203   BUN 15 09/17/2020 1203   BUN 18 05/01/2019 1108   CREATININE 0.80 09/17/2020 1203   CREATININE 0.62 08/15/2020 1059      Component Value Date/Time   CALCIUM 9.9 09/17/2020 1203   ALKPHOS 370 (H) 09/17/2020 1203   AST 53 (H) 09/17/2020 1203   AST 53 (H) 08/15/2020 1059   ALT 51 (H) 09/17/2020 1203   ALT 71 (H) 08/15/2020 1059   BILITOT 0.9 09/17/2020 1203   BILITOT 0.9 08/15/2020 1059      Impression and Plan: Dean Singh is a very nice 60 year old white male.  He has metastatic adenocarcinoma the pancreas.  He has extensive liver metastasis.  I really hate the fact that we have not been able to treat him for a while.  Again, the COVID really has not helped the situation.  His diarrhea is not helping the situation.  I discontinued some of his medications I really did not think that he needed.  Hopefully the Lomotil will help with his diarrhea.  We will see what the CA 19-9 is.  For right now, we will plan to get him back to see [place] in a few weeks.  We will try to get him through this cycle of treatment.    Volanda Napoleon,  MD 9/13/202212:55 PM

## 2020-09-17 NOTE — Progress Notes (Signed)
Oncology Nurse Navigator Documentation  Oncology Nurse Navigator Flowsheets 09/17/2020  Abnormal Finding Date -  Confirmed Diagnosis Date -  Diagnosis Status -  Planned Course of Treatment -  Phase of Treatment -  Chemotherapy Actual Start Date: -  Navigator Follow Up Date: 10/17/2020  Navigator Follow Up Reason: Follow-up Appointment;Chemotherapy  Navigator Location [facility]  Referral Date to RadOnc/MedOnc -  Navigator Encounter Type Appt/Treatment Plan Review  Telephone -  Treatment Initiated Date -  Patient Visit Type MedOnc  Treatment Phase Active Tx  Barriers/Navigation Needs Coordination of Care;Education  Education -  Interventions None Required  Acuity Level 2-Minimal Needs (1-2 Barriers Identified)  Coordination of Care -  Education Method -  Support Groups/Services Friends and Family  Time Spent with Patient 15

## 2020-09-17 NOTE — Patient Instructions (Signed)
Hyperglycemia ?Hyperglycemia is when the sugar (glucose) level in your blood is too high. High blood sugar can happen to people who have or do not have diabetes. High blood sugar can happen quickly. It can be an emergency. ?What are the causes? ?If you have diabetes, high blood sugar may be caused by: ?Medicines that increase blood sugar or affect your control of diabetes. ?Getting less physical activity. ?Overeating. ?Being sick or injured or having an infection. ?Having surgery. ?Stress. ?Not giving yourself enough insulin (if you are taking it). ?You may have high blood sugar because you have diabetes that has not been diagnosed yet. ?If you do not have diabetes, high blood sugar may be caused by: ?Certain medicines. ?Stress. ?A bad illness. ?An infection. ?Having surgery. ?Diseases of the pancreas. ?What increases the risk? ?This condition is more likely to develop in people who have risk factors for diabetes, such as: ?Having a family member with diabetes. ?Certain conditions in which the body's defense system (immune system) attacks itself. These are called autoimmune disorders. ?Being overweight. ?Not being active. ?Having a condition called insulin resistance. ?Having a history of: ?Prediabetes. ?Diabetes when pregnant. ?Polycystic ovarian syndrome (PCOS). ?What are the signs or symptoms? ?This condition may not cause symptoms. If you do have symptoms, they may include: ?Feeling more thirsty than normal. ?Needing to pee (urinate) more often than normal. ?Hunger. ?Feeling very tired. ?Blurry eyesight (vision). ?You may get other symptoms as the condition gets worse, such as: ?Dry mouth. ?Pain in your belly (abdomen). ?Not being hungry (loss of appetite). ?Breath that smells fruity. ?Weakness. ?Weight loss that is not planned. ?A tingling or numb feeling in your hands or feet. ?A headache. ?Cuts or bruises that heal slowly. ?How is this treated? ?Treatment depends on the cause of your condition. Treatment may  include: ?Taking medicine to control your blood sugar levels. ?Changing your medicine or dosage if you take insulin or other diabetes medicines. ?Lifestyle changes. These may include: ?Exercising more. ?Eating healthier foods. ?Losing weight. ?Treating an illness or infection. ?Checking your blood sugar more often. ?Stopping or reducing steroid medicines. ?If your condition gets very bad, you will need to be treated in the hospital. ?Follow these instructions at home: ?General instructions ?Take over-the-counter and prescription medicines only as told by your doctor. ?Do not smoke or use any products that contain nicotine or tobacco. If you need help quitting, ask your doctor. ?If you drink alcohol: ?Limit how much you have to: ?0-1 drink a day for women who are not pregnant. ?0-2 drinks a day for men. ?Know how much alcohol is in a drink. In the [place] one drink equals one 12 oz bottle of beer (355 mL), one 5 oz glass of wine (148 mL), or one 1? oz glass of hard liquor (44 mL). ?Manage stress. If you need help with this, ask your doctor. ?Do exercises as told by your doctor. ?Keep all follow-up visits. ?Eating and drinking ? ?Stay at a healthy weight. ?Make sure you drink enough fluid when you: ?Exercise. ?Get sick. ?Are in hot temperatures. ?Drink enough fluid to keep your pee (urine) pale yellow. ?If you have diabetes: ? ?Know the symptoms of high blood sugar. ?Follow your diabetes management plan as told by your doctor. Make sure you: ?Take insulin and medicines as told. ?Follow your exercise plan. ?Follow your meal plan. Eat on time. Do not skip meals. ?Check your blood sugar as often as told. Make sure you check before and after exercise. If you   exercise longer or in a different way, check your blood sugar more often. ?Follow your sick day plan whenever you cannot eat or drink normally. Make this plan ahead of time with your doctor. ?Share your diabetes management plan with people in your workplace, school,  and household. ?Check your pee for ketones when you are ill and as told by your doctor. ?Carry a card or wear jewelry that says that you have diabetes. ?Where to find more information ?American Diabetes Association: www.diabetes.org ?Contact a doctor if: ?Your blood sugar level is at or above 240 mg/dL (13.3 mmol/L) for 2 days in a row. ?You have problems keeping your blood sugar in your target range. ?You have high blood pressure often. ?You have signs of illness, such as: ?Feeling like you may vomit (feeling nauseous). ?Vomiting. ?A fever. ?Get help right away if: ?Your blood sugar monitor reads "high" even when you are taking insulin. ?You have trouble breathing. ?You have a change in how you think, feel, or act (mental status). ?You feel like you may vomit, and the feeling does not go away. ?You cannot stop vomiting. ?These symptoms may be an emergency. Get medical help right away. Call your local emergency services (911 in the [place] ?Do not wait to see if the symptoms will go away. ?Do not drive yourself to the hospital. ?Summary ?Hyperglycemia is when the sugar (glucose) level in your blood is too high. ?High blood sugar can happen to people who have or do not have diabetes. ?Make sure you drink enough fluids and follow your meal plan. Exercise as often as told by your doctor. ?Contact your doctor if you have problems keeping your blood sugar in your target range. ?This information is not intended to replace advice given to you by your health care provider. Make sure you discuss any questions you have with your health care provider. ?Document Revised: 10/06/2019 Document Reviewed: 10/06/2019 ?Elsevier Patient Education ? 2022 Elsevier [employer] ? ?

## 2020-09-17 NOTE — Progress Notes (Signed)
Ok to treat with todays labs per  Dr Ennever °

## 2020-09-18 ENCOUNTER — Other Ambulatory Visit: Payer: Self-pay | Admitting: Family Medicine

## 2020-09-18 ENCOUNTER — Encounter: Payer: Self-pay | Admitting: Family

## 2020-09-18 ENCOUNTER — Other Ambulatory Visit: Payer: Self-pay | Admitting: Internal Medicine

## 2020-09-18 ENCOUNTER — Telehealth: Payer: Self-pay

## 2020-09-18 ENCOUNTER — Other Ambulatory Visit: Payer: Self-pay | Admitting: Hematology & Oncology

## 2020-09-18 DIAGNOSIS — C787 Secondary malignant neoplasm of liver and intrahepatic bile duct: Secondary | ICD-10-CM

## 2020-09-18 DIAGNOSIS — C259 Malignant neoplasm of pancreas, unspecified: Secondary | ICD-10-CM

## 2020-09-18 LAB — CANCER ANTIGEN 19-9: CA 19-9: 9170 U/mL — ABNORMAL HIGH (ref 0–35)

## 2020-09-18 NOTE — Telephone Encounter (Signed)
Spoke pt's wife with [facility] (ok per DPR) and confirmed that Pepcid, Crestor and Zetia were the only meds discontinued as of yesterdays apt.

## 2020-09-18 NOTE — Telephone Encounter (Signed)
Called and informed patient of lab results, he was very appreciative of call and denies any questions or concerns at this time.

## 2020-09-18 NOTE — Telephone Encounter (Signed)
-----   Message from Volanda Napoleon, MD sent at 09/18/2020 12:37 PM EDT ----- Please call and tell him that the tumor marker is now down to 9100.  This is absolutely incredible.  We know that God is at work.  Dean Singh

## 2020-09-19 ENCOUNTER — Other Ambulatory Visit: Payer: Self-pay | Admitting: Internal Medicine

## 2020-09-19 ENCOUNTER — Encounter: Payer: Self-pay | Admitting: *Deleted

## 2020-09-19 MED ORDER — MORPHINE SULFATE (CONCENTRATE) 20 MG/ML PO SOLN: 10.0000 mg | ORAL | 0 refills | Status: DC | PRN

## 2020-09-19 NOTE — Addendum Note (Signed)
Addended by: Burney Gauze R on: 09/19/2020 03:47 PM   Modules accepted: Orders

## 2020-09-20 ENCOUNTER — Encounter: Payer: Self-pay | Admitting: Hematology & Oncology

## 2020-09-20 ENCOUNTER — Other Ambulatory Visit: Payer: Self-pay | Admitting: *Deleted

## 2020-09-20 ENCOUNTER — Other Ambulatory Visit (HOSPITAL_BASED_OUTPATIENT_CLINIC_OR_DEPARTMENT_OTHER): Payer: Self-pay

## 2020-09-20 ENCOUNTER — Other Ambulatory Visit: Payer: Self-pay | Admitting: Hematology & Oncology

## 2020-09-20 ENCOUNTER — Encounter: Payer: Self-pay | Admitting: *Deleted

## 2020-09-20 MED ORDER — OXYCODONE HCL 20 MG/ML PO CONC
10.0000 mg | ORAL | 0 refills | Status: DC | PRN
  Filled 2020-09-20 (×2): qty 60, 20d supply, fill #0

## 2020-09-20 NOTE — Progress Notes (Signed)
See MyChart messages dated 09/19/2020  Oncology Nurse Navigator Documentation  Oncology Nurse Navigator Flowsheets 09/20/2020  Abnormal Finding Date -  Confirmed Diagnosis Date -  Diagnosis Status -  Planned Course of Treatment -  Phase of Treatment -  Chemotherapy Actual Start Date: -  Navigator Follow Up Date: 10/17/2020  Navigator Follow Up Reason: Follow-up Appointment;Chemotherapy  Navigator Location [facility]  Referral Date to RadOnc/MedOnc -  Navigator Encounter Type MyChart  Telephone -  Treatment Initiated Date -  Patient Visit Type MedOnc  Treatment Phase Active Tx  Barriers/Navigation Needs Coordination of Care;Education  Education Other  Interventions Medication Assistance;Education  Acuity Level 2-Minimal Needs (1-2 Barriers Identified)  Coordination of Care -  Education Method Written  Support Groups/Services Friends and Family  Time Spent with Patient 30

## 2020-09-21 ENCOUNTER — Other Ambulatory Visit: Payer: Self-pay | Admitting: Family Medicine

## 2020-09-23 ENCOUNTER — Encounter: Payer: Self-pay | Admitting: Family Medicine

## 2020-09-23 ENCOUNTER — Encounter: Payer: Self-pay | Admitting: Internal Medicine

## 2020-09-23 ENCOUNTER — Other Ambulatory Visit: Payer: Self-pay | Admitting: Family Medicine

## 2020-09-23 MED ORDER — PREGABALIN 300 MG PO CAPS: ORAL_CAPSULE | ORAL | 5 refills | Status: DC

## 2020-09-24 ENCOUNTER — Other Ambulatory Visit: Payer: Self-pay

## 2020-09-24 ENCOUNTER — Other Ambulatory Visit: Payer: Self-pay | Admitting: Hematology & Oncology

## 2020-09-24 ENCOUNTER — Inpatient Hospital Stay: Payer: Medicare Other

## 2020-09-24 DIAGNOSIS — C259 Malignant neoplasm of pancreas, unspecified: Secondary | ICD-10-CM

## 2020-09-24 DIAGNOSIS — Z5111 Encounter for antineoplastic chemotherapy: Secondary | ICD-10-CM | POA: Diagnosis not present

## 2020-09-24 DIAGNOSIS — C787 Secondary malignant neoplasm of liver and intrahepatic bile duct: Secondary | ICD-10-CM

## 2020-09-24 LAB — CBC WITH DIFFERENTIAL (CANCER CENTER ONLY)
Abs Immature Granulocytes: 0.02 10*3/uL (ref 0.00–0.07)
Basophils Absolute: 0 10*3/uL (ref 0.0–0.1)
Basophils Relative: 1 %
Eosinophils Absolute: 0.1 10*3/uL (ref 0.0–0.5)
Eosinophils Relative: 3 %
HCT: 36.8 % — ABNORMAL LOW (ref 39.0–52.0)
Hemoglobin: 12.3 g/dL — ABNORMAL LOW (ref 13.0–17.0)
Immature Granulocytes: 1 %
Lymphocytes Relative: 39 %
Lymphs Abs: 1.2 10*3/uL (ref 0.7–4.0)
MCH: 32 pg (ref 26.0–34.0)
MCHC: 33.4 g/dL (ref 30.0–36.0)
MCV: 95.8 fL (ref 80.0–100.0)
Monocytes Absolute: 0.3 10*3/uL (ref 0.1–1.0)
Monocytes Relative: 9 %
Neutro Abs: 1.5 10*3/uL — ABNORMAL LOW (ref 1.7–7.7)
Neutrophils Relative %: 47 %
Platelet Count: 50 10*3/uL — ABNORMAL LOW (ref 150–400)
RBC: 3.84 MIL/uL — ABNORMAL LOW (ref 4.22–5.81)
RDW: 15 % (ref 11.5–15.5)
WBC Count: 3.2 10*3/uL — ABNORMAL LOW (ref 4.0–10.5)
nRBC: 0 % (ref 0.0–0.2)

## 2020-09-24 LAB — CMP (CANCER CENTER ONLY)
ALT: 87 U/L — ABNORMAL HIGH (ref 0–44)
AST: 67 U/L — ABNORMAL HIGH (ref 15–41)
Albumin: 3.9 g/dL (ref 3.5–5.0)
Alkaline Phosphatase: 377 U/L — ABNORMAL HIGH (ref 38–126)
Anion gap: 8 (ref 5–15)
BUN: 13 mg/dL (ref 6–20)
CO2: 28 mmol/L (ref 22–32)
Calcium: 9.6 mg/dL (ref 8.9–10.3)
Chloride: 99 mmol/L (ref 98–111)
Creatinine: 0.58 mg/dL — ABNORMAL LOW (ref 0.61–1.24)
GFR, Estimated: >60 mL/min (ref >60)
Glucose, Bld: 222 mg/dL — ABNORMAL HIGH (ref 70–99)
Potassium: 4.2 mmol/L (ref 3.5–5.1)
Sodium: 135 mmol/L (ref 135–145)
Total Bilirubin: 1.2 mg/dL (ref 0.3–1.2)
Total Protein: 7.4 g/dL (ref 6.5–8.1)

## 2020-09-24 MED ORDER — HEPARIN SOD (PORK) LOCK FLUSH 100 UNIT/ML IV SOLN
500.0000 [IU] | Freq: Once | INTRAVENOUS | Status: AC
  Administered 2020-09-24: 500 [IU] via INTRAVENOUS

## 2020-09-24 MED ORDER — SODIUM CHLORIDE 0.9% FLUSH
10.0000 mL | INTRAVENOUS | Status: DC | PRN
  Administered 2020-09-24: 10 mL via INTRAVENOUS

## 2020-09-24 NOTE — Progress Notes (Signed)
Patient requests this RN palpate his midline abd approximately an inch below umbilicus. Hardened area able to be palpated with pt repositioning. Pt reports this is new and is slightly tender but not necessarily painful. Pt reports diarrhea has resolved and is experiencing "more towards constipation" but is having a BM daily. Pt's recent CA 19.9 has decreased considerably.   This information relayed to MD. MD will order CT scan. Pt aware and agrees with plan. Aware insurance company must approve  before scan will be scheduled.   Platelets of 50 and ANC of 1.5 reviewed with Dr Marin Olp. No treatment today. Pt aware and verbalizes understanding. dph

## 2020-09-24 NOTE — Patient Instructions (Signed)
Implanted Port Home Guide An implanted port is a device that is placed under the skin. It is usually placed in the chest. The device can be used to give IV medicine, to take blood, or for dialysis. You may have an implanted port if: You need IV medicine that would be irritating to the small veins in your hands or arms. You need IV medicines, such as antibiotics, for a long period of time. You need IV nutrition for a long period of time. You need dialysis. When you have a port, your health care provider can choose to use the port instead of veins in your arms for these procedures. You may have fewer limitations when using a port than you would if you used other types of long-term IVs, and you will likely be able to return to normal activities after your incision heals. An implanted port has two main parts: Reservoir. The reservoir is the part where a needle is inserted to give medicines or draw blood. The reservoir is round. After it is placed, it appears as a small, raised area under your skin. Catheter. The catheter is a thin, flexible tube that connects the reservoir to a vein. Medicine that is inserted into the reservoir goes into the catheter and then into the vein. How is my port accessed? To access your port: A numbing cream may be placed on the skin over the port site. Your health care provider will put on a mask and sterile gloves. The skin over your port will be cleaned carefully with a germ-killing soap and allowed to dry. Your health care provider will gently pinch the port and insert a needle into it. Your health care provider will check for a blood return to make sure the port is in the vein and is not clogged. If your port needs to remain accessed to get medicine continuously (constant infusion), your health care provider will place a clear bandage (dressing) over the needle site. The dressing and needle will need to be changed every week, or as told by your health care provider. What  is flushing? Flushing helps keep the port from getting clogged. Follow instructions from your health care provider about how and when to flush the port. Ports are usually flushed with saline solution or a medicine called heparin. The need for flushing will depend on how the port is used: If the port is only used from time to time to give medicines or draw blood, the port may need to be flushed: Before and after medicines have been given. Before and after blood has been drawn. As part of routine maintenance. Flushing may be recommended every 4-6 weeks. If a constant infusion is running, the port may not need to be flushed. Throw away any syringes in a disposal container that is meant for sharp items (sharps container). You can buy a sharps container from a pharmacy, or you can make one by using an empty hard plastic bottle with a cover. How long will my port stay implanted? The port can stay in for as long as your health care provider thinks it is needed. When it is time for the port to come out, a surgery will be done to remove it. The surgery will be similar to the procedure that was done to put the port in. Follow these instructions at home:  Flush your port as told by your health care provider. If you need an infusion over several days, follow instructions from your health care provider about how   to take care of your port site. Make sure you: Wash your hands with soap and water before you change your dressing. If soap and water are not available, use alcohol-based hand sanitizer. Change your dressing as told by your health care provider. Place any used dressings or infusion bags into a plastic bag. Throw that bag in the trash. Keep the dressing that covers the needle clean and dry. Do not get it wet. Do not use scissors or sharp objects near the tube. Keep the tube clamped, unless it is being used. Check your port site every day for signs of infection. Check for: Redness, swelling, or  pain. Fluid or blood. Pus or a bad smell. Protect the skin around the port site. Avoid wearing bra straps that rub or irritate the site. Protect the skin around your port from seat belts. Place a soft pad over your chest if needed. Bathe or shower as told by your health care provider. The site may get wet as long as you are not actively receiving an infusion. Return to your normal activities as told by your health care provider. Ask your health care provider what activities are safe for you. Carry a medical alert card or wear a medical alert bracelet at all times. This will let health care providers know that you have an implanted port in case of an emergency. Get help right away if: You have redness, swelling, or pain at the port site. You have fluid or blood coming from your port site. You have pus or a bad smell coming from the port site. You have a fever. Summary Implanted ports are usually placed in the chest for long-term IV access. Follow instructions from your health care provider about flushing the port and changing bandages (dressings). Take care of the area around your port by avoiding clothing that puts pressure on the area, and by watching for signs of infection. Protect the skin around your port from seat belts. Place a soft pad over your chest if needed. Get help right away if you have a fever or you have redness, swelling, pain, drainage, or a bad smell at the port site. This information is not intended to replace advice given to you by your health care provider. Make sure you discuss any questions you have with your health care provider. Document Revised: 03/13/2020 Document Reviewed: 05/08/2019 Elsevier Patient Education  2022 [employer] Inc.  

## 2020-09-24 NOTE — Patient Instructions (Signed)
Thrombocytopenia Thrombocytopenia means that you have a low number of platelets in your blood. Platelets are tiny cells in the blood. When you bleed, they clump together at the cut or injury to stop the bleeding. This is called blood clotting. If you do not have enough platelets, it can cause bleeding problems. Some cases of this condition are mild while others are more severe. What are the causes? This condition may be caused by: Your body not making enough platelets. This may be caused by: Your bone marrow not making blood cells (aplastic anemia). Cancer in the bone marrow. Certain medicines. Infection in the bone marrow. Drinking a lot of alcohol. Your body destroying platelets too quickly. This may be caused by: Certain immune diseases. Certain medicines. Certain blood clotting disorders. Certain disorders that are passed from parent to child (inherited). Certain bleeding disorders. Pregnancy. Having a spleen that is larger than normal. What are the signs or symptoms? Bleeding that is not normal. Nosebleeds. Heavy menstrual periods. Blood in the pee (urine) or poop (stool). A purple-like color to the skin (purpura). Bruising. A rash that looks like pinpoint, purple-red spots (petechiae). How is this treated? Treatment of another condition that is causing the low platelet count. Medicines to help protect your platelets from being destroyed. A replacement (transfusion) of platelets to stop or prevent bleeding. Surgery to remove the spleen. Follow these instructions at home: Activity Avoid activities that could cause you to get hurt or bruised. Follow instructions about how to prevent falls. Take care not to cut yourself: When you shave. When you use scissors, needles, knives, or other tools. Take care not to burn yourself: When you use an iron. When you cook. General instructions  Check your skin and the inside of your mouth for bruises or blood as told by your  doctor. Check to see if there is blood in your spit (sputum), pee, and poop. Do this as told by your doctor. Do not drink alcohol. Take over-the-counter and prescription medicines only as told by your doctor. Do not take any medicines that have aspirin or NSAIDs in them. These medicines can thin your blood and cause you to bleed. Tell all of your doctors that you have this condition. Be sure to tell your dentist and eye doctor too. Contact a doctor if: You have bruises and you do not know why. Get help right away if: You are bleeding anywhere on your body. You have blood in your spit, pee, or poop. Summary Thrombocytopenia means that you have a low number of platelets in your blood. Platelets are needed for blood clotting. Symptoms of this condition include bleeding that is not normal, and bruising. Take care not to cut or burn yourself. This information is not intended to replace advice given to you by your health care provider. Make sure you discuss any questions you have with your health care provider. Document Revised: 04/02/2020 Document Reviewed: 09/23/2017 Elsevier Patient Education  [employer].

## 2020-09-26 ENCOUNTER — Other Ambulatory Visit: Payer: Self-pay

## 2020-09-26 ENCOUNTER — Ambulatory Visit (HOSPITAL_BASED_OUTPATIENT_CLINIC_OR_DEPARTMENT_OTHER)
Admission: RE | Admit: 2020-09-26 | Discharge: 2020-09-26 | Disposition: A | Discharge status: Home / Self Care | Payer: Medicare Other | Source: Ambulatory Visit | Attending: Hematology & Oncology | Admitting: Hematology & Oncology

## 2020-09-26 DIAGNOSIS — C259 Malignant neoplasm of pancreas, unspecified: Secondary | ICD-10-CM | POA: Insufficient documentation

## 2020-09-26 DIAGNOSIS — C787 Secondary malignant neoplasm of liver and intrahepatic bile duct: Secondary | ICD-10-CM | POA: Diagnosis present

## 2020-09-26 MED ORDER — IOHEXOL 350 MG/ML SOLN
85.0000 mL | Freq: Once | INTRAVENOUS | Status: AC | PRN
  Administered 2020-09-26: 85 mL via INTRAVENOUS

## 2020-09-30 ENCOUNTER — Telehealth: Payer: Self-pay

## 2020-09-30 NOTE — Telephone Encounter (Signed)
-----   Message from Volanda Napoleon, MD sent at 09/30/2020  9:15 AM EDT ----- Please call and tell him that everything is still improving.  There is small liver metastasis.  I am just very happy for him.  His tumor marker continues to improve.[name]

## 2020-09-30 NOTE — Telephone Encounter (Signed)
Called and informed patient of scan results, patient inquired about the hard place on his abdomen that was noted at his last OV and reason for the scan. Pt also inquired about the 3 mm nodule that was noted on the scan in his right lobe.  Spoke with Dr Marin Olp and he stated nothing was mentioned on the scan regarding the abdominal lump. Advised it is probably ok and to monitor it. The 20mm nodule is probably due to pt being a smoker and we will monitor that as well. Pt advised and states understanding.

## 2020-10-01 ENCOUNTER — Other Ambulatory Visit: Payer: Self-pay

## 2020-10-01 ENCOUNTER — Inpatient Hospital Stay: Payer: Medicare Other

## 2020-10-01 VITALS — BP 102/54 | HR 76 | Temp 98.3°F | Resp 17

## 2020-10-01 DIAGNOSIS — C787 Secondary malignant neoplasm of liver and intrahepatic bile duct: Secondary | ICD-10-CM

## 2020-10-01 DIAGNOSIS — C259 Malignant neoplasm of pancreas, unspecified: Secondary | ICD-10-CM

## 2020-10-01 DIAGNOSIS — Z5111 Encounter for antineoplastic chemotherapy: Secondary | ICD-10-CM | POA: Diagnosis not present

## 2020-10-01 LAB — CMP (CANCER CENTER ONLY)
ALT: 55 U/L — ABNORMAL HIGH (ref 0–44)
AST: 57 U/L — ABNORMAL HIGH (ref 15–41)
Albumin: 3.8 g/dL (ref 3.5–5.0)
Alkaline Phosphatase: 396 U/L — ABNORMAL HIGH (ref 38–126)
Anion gap: 8 (ref 5–15)
BUN: 13 mg/dL (ref 6–20)
CO2: 26 mmol/L (ref 22–32)
Calcium: 9.6 mg/dL (ref 8.9–10.3)
Chloride: 101 mmol/L (ref 98–111)
Creatinine: 0.62 mg/dL (ref 0.61–1.24)
GFR, Estimated: >60 mL/min (ref >60)
Glucose, Bld: 181 mg/dL — ABNORMAL HIGH (ref 70–99)
Potassium: 4.2 mmol/L (ref 3.5–5.1)
Sodium: 135 mmol/L (ref 135–145)
Total Bilirubin: 1.4 mg/dL — ABNORMAL HIGH (ref 0.3–1.2)
Total Protein: 7.4 g/dL (ref 6.5–8.1)

## 2020-10-01 LAB — CBC WITH DIFFERENTIAL (CANCER CENTER ONLY)
Abs Immature Granulocytes: 0.06 10*3/uL (ref 0.00–0.07)
Basophils Absolute: 0.1 10*3/uL (ref 0.0–0.1)
Basophils Relative: 1 %
Eosinophils Absolute: 0.6 10*3/uL — ABNORMAL HIGH (ref 0.0–0.5)
Eosinophils Relative: 8 %
HCT: 36.6 % — ABNORMAL LOW (ref 39.0–52.0)
Hemoglobin: 12.4 g/dL — ABNORMAL LOW (ref 13.0–17.0)
Immature Granulocytes: 1 %
Lymphocytes Relative: 25 %
Lymphs Abs: 1.7 10*3/uL (ref 0.7–4.0)
MCH: 32.5 pg (ref 26.0–34.0)
MCHC: 33.9 g/dL (ref 30.0–36.0)
MCV: 96.1 fL (ref 80.0–100.0)
Monocytes Absolute: 0.8 10*3/uL (ref 0.1–1.0)
Monocytes Relative: 12 %
Neutro Abs: 3.8 10*3/uL (ref 1.7–7.7)
Neutrophils Relative %: 53 %
Platelet Count: 174 10*3/uL (ref 150–400)
RBC: 3.81 MIL/uL — ABNORMAL LOW (ref 4.22–5.81)
RDW: 16 % — ABNORMAL HIGH (ref 11.5–15.5)
WBC Count: 7.1 10*3/uL (ref 4.0–10.5)
nRBC: 0 % (ref 0.0–0.2)

## 2020-10-01 MED ORDER — HEPARIN SOD (PORK) LOCK FLUSH 100 UNIT/ML IV SOLN
500.0000 [IU] | Freq: Once | INTRAVENOUS | Status: AC
  Administered 2020-10-01: 500 [IU] via INTRAVENOUS

## 2020-10-01 MED ORDER — SODIUM CHLORIDE 0.9% FLUSH
10.0000 mL | INTRAVENOUS | Status: DC | PRN
  Administered 2020-10-01 (×2): 10 mL

## 2020-10-01 MED ORDER — SODIUM CHLORIDE 0.9 % IV SOLN
800.0000 mg/m2 | Freq: Once | INTRAVENOUS | Status: AC
  Administered 2020-10-01: 1292 mg via INTRAVENOUS
  Filled 2020-10-01: qty 26.3

## 2020-10-01 MED ORDER — PACLITAXEL PROTEIN-BOUND CHEMO INJECTION 100 MG
101.2500 mg/m2 | Freq: Once | INTRAVENOUS | Status: AC
  Administered 2020-10-01: 175 mg via INTRAVENOUS
  Filled 2020-10-01: qty 35

## 2020-10-01 MED ORDER — PROCHLORPERAZINE MALEATE 10 MG PO TABS
10.0000 mg | ORAL_TABLET | Freq: Once | ORAL | Status: AC
  Administered 2020-10-01: 10 mg via ORAL
  Filled 2020-10-01: qty 1

## 2020-10-01 MED ORDER — SODIUM CHLORIDE 0.9 % IV SOLN: Freq: Once | INTRAVENOUS | Status: AC

## 2020-10-01 MED ORDER — HEPARIN SOD (PORK) LOCK FLUSH 100 UNIT/ML IV SOLN
500.0000 [IU] | Freq: Once | INTRAVENOUS | Status: AC | PRN
  Administered 2020-10-01: 500 [IU]

## 2020-10-01 NOTE — Patient Instructions (Signed)
Implanted Port Home Guide An implanted port is a device that is placed under the skin. It is usually placed in the chest. The device can be used to give IV medicine, to take blood, or for dialysis. You may have an implanted port if: You need IV medicine that would be irritating to the small veins in your hands or arms. You need IV medicines, such as antibiotics, for a long period of time. You need IV nutrition for a long period of time. You need dialysis. When you have a port, your health care provider can choose to use the port instead of veins in your arms for these procedures. You may have fewer limitations when using a port than you would if you used other types of long-term IVs, and you will likely be able to return to normal activities after your incision heals. An implanted port has two main parts: Reservoir. The reservoir is the part where a needle is inserted to give medicines or draw blood. The reservoir is round. After it is placed, it appears as a small, raised area under your skin. Catheter. The catheter is a thin, flexible tube that connects the reservoir to a vein. Medicine that is inserted into the reservoir goes into the catheter and then into the vein. How is my port accessed? To access your port: A numbing cream may be placed on the skin over the port site. Your health care provider will put on a mask and sterile gloves. The skin over your port will be cleaned carefully with a germ-killing soap and allowed to dry. Your health care provider will gently pinch the port and insert a needle into it. Your health care provider will check for a blood return to make sure the port is in the vein and is not clogged. If your port needs to remain accessed to get medicine continuously (constant infusion), your health care provider will place a clear bandage (dressing) over the needle site. The dressing and needle will need to be changed every week, or as told by your health care provider. What  is flushing? Flushing helps keep the port from getting clogged. Follow instructions from your health care provider about how and when to flush the port. Ports are usually flushed with saline solution or a medicine called heparin. The need for flushing will depend on how the port is used: If the port is only used from time to time to give medicines or draw blood, the port may need to be flushed: Before and after medicines have been given. Before and after blood has been drawn. As part of routine maintenance. Flushing may be recommended every 4-6 weeks. If a constant infusion is running, the port may not need to be flushed. Throw away any syringes in a disposal container that is meant for sharp items (sharps container). You can buy a sharps container from a pharmacy, or you can make one by using an empty hard plastic bottle with a cover. How long will my port stay implanted? The port can stay in for as long as your health care provider thinks it is needed. When it is time for the port to come out, a surgery will be done to remove it. The surgery will be similar to the procedure that was done to put the port in. Follow these instructions at home:  Flush your port as told by your health care provider. If you need an infusion over several days, follow instructions from your health care provider about how   to take care of your port site. Make sure you: Wash your hands with soap and water before you change your dressing. If soap and water are not available, use alcohol-based hand sanitizer. Change your dressing as told by your health care provider. Place any used dressings or infusion bags into a plastic bag. Throw that bag in the trash. Keep the dressing that covers the needle clean and dry. Do not get it wet. Do not use scissors or sharp objects near the tube. Keep the tube clamped, unless it is being used. Check your port site every day for signs of infection. Check for: Redness, swelling, or  pain. Fluid or blood. Pus or a bad smell. Protect the skin around the port site. Avoid wearing bra straps that rub or irritate the site. Protect the skin around your port from seat belts. Place a soft pad over your chest if needed. Bathe or shower as told by your health care provider. The site may get wet as long as you are not actively receiving an infusion. Return to your normal activities as told by your health care provider. Ask your health care provider what activities are safe for you. Carry a medical alert card or wear a medical alert bracelet at all times. This will let health care providers know that you have an implanted port in case of an emergency. Get help right away if: You have redness, swelling, or pain at the port site. You have fluid or blood coming from your port site. You have pus or a bad smell coming from the port site. You have a fever. Summary Implanted ports are usually placed in the chest for long-term IV access. Follow instructions from your health care provider about flushing the port and changing bandages (dressings). Take care of the area around your port by avoiding clothing that puts pressure on the area, and by watching for signs of infection. Protect the skin around your port from seat belts. Place a soft pad over your chest if needed. Get help right away if you have a fever or you have redness, swelling, pain, drainage, or a bad smell at the port site. This information is not intended to replace advice given to you by your health care provider. Make sure you discuss any questions you have with your health care provider. Document Revised: 03/13/2020 Document Reviewed: 05/08/2019 Elsevier Patient Education  2022 [employer] Inc.  

## 2020-10-01 NOTE — Patient Instructions (Signed)
[facility]  Discharge Instructions: Thank you for choosing [facility] to provide your oncology and hematology care.   If you have a lab appointment with the [facility], please go directly to the [facility] and check in at the registration area.  Wear comfortable clothing and clothing appropriate for easy access to any Portacath or PICC line.   We strive to give you quality time with your provider. You may need to reschedule your appointment if you arrive late (15 or more minutes).  Arriving late affects you and other patients whose appointments are after yours.  Also, if you miss three or more appointments without notifying the office, you may be dismissed from the clinic at the provider's discretion.      For prescription refill requests, have your pharmacy contact our office and allow 72 hours for refills to be completed.    Today you received the following chemotherapy and/or immunotherapy agents Gemzar and Abraxane      To help prevent nausea and vomiting after your treatment, we encourage you to take your nausea medication as directed.  BELOW ARE SYMPTOMS THAT SHOULD BE REPORTED IMMEDIATELY: *FEVER GREATER THAN 100.4 F (38 C) OR HIGHER *CHILLS OR SWEATING *NAUSEA AND VOMITING THAT IS NOT CONTROLLED WITH YOUR NAUSEA MEDICATION *UNUSUAL SHORTNESS OF BREATH *UNUSUAL BRUISING OR BLEEDING *URINARY PROBLEMS (pain or burning when urinating, or frequent urination) *BOWEL PROBLEMS (unusual diarrhea, constipation, pain near the anus) TENDERNESS IN MOUTH AND THROAT WITH OR WITHOUT PRESENCE OF ULCERS (sore throat, sores in mouth, or a toothache) UNUSUAL RASH, SWELLING OR PAIN  UNUSUAL VAGINAL DISCHARGE OR ITCHING   Items with * indicate a potential emergency and should be followed up as soon as possible or go to the Emergency Department if any problems should occur.  Please show the CHEMOTHERAPY ALERT CARD or IMMUNOTHERAPY ALERT CARD at  check-in to the Emergency Department and triage nurse. Should you have questions after your visit or need to cancel or reschedule your appointment, please contact [facility]  [phone] and follow the prompts.  Office hours are 8:00 a.m. to 4:30 p.m. Monday - Friday. Please note that voicemails left after 4:00 p.m. may not be returned until the following business day.  We are closed weekends and major holidays. You have access to a nurse at all times for urgent questions. Please call the main number to the clinic [phone] and follow the prompts.  For any non-urgent questions, you may also contact your provider using MyChart. We now offer e-Visits for anyone 26 and older to request care online for non-urgent symptoms. For details visit [email or web address].   Also download the MyChart app! Go to the app store, search "MyChart", open the app, select [facility], and log in with your MyChart username and password.  Due to Covid, a mask is required upon entering the hospital/clinic. If you do not have a mask, one will be given to you upon arrival. For doctor visits, patients may have 1 support person aged 4 or older with them. For treatment visits, patients cannot have anyone with them due to current Covid guidelines and our immunocompromised population.

## 2020-10-02 ENCOUNTER — Encounter: Payer: Self-pay | Admitting: *Deleted

## 2020-10-02 ENCOUNTER — Other Ambulatory Visit: Payer: Self-pay | Admitting: Hematology & Oncology

## 2020-10-03 ENCOUNTER — Other Ambulatory Visit: Payer: Self-pay | Admitting: Family

## 2020-10-03 MED ORDER — FENTANYL 50 MCG/HR TD PT72: 1.0000 | MEDICATED_PATCH | TRANSDERMAL | 0 refills | Status: DC

## 2020-10-14 ENCOUNTER — Other Ambulatory Visit: Payer: Self-pay | Admitting: Vascular Surgery

## 2020-10-14 ENCOUNTER — Other Ambulatory Visit (HOSPITAL_BASED_OUTPATIENT_CLINIC_OR_DEPARTMENT_OTHER): Payer: Self-pay | Admitting: *Deleted

## 2020-10-14 ENCOUNTER — Other Ambulatory Visit: Payer: Self-pay | Admitting: Family Medicine

## 2020-10-14 ENCOUNTER — Other Ambulatory Visit: Payer: Self-pay | Admitting: Internal Medicine

## 2020-10-14 MED ORDER — CLOPIDOGREL BISULFATE 75 MG PO TABS: 75.0000 mg | ORAL_TABLET | Freq: Every day | ORAL | 0 refills | Status: DC

## 2020-10-14 NOTE — Telephone Encounter (Signed)
Rx(s) sent to pharmacy electronically.  

## 2020-10-17 ENCOUNTER — Inpatient Hospital Stay: Payer: Medicare Other

## 2020-10-17 ENCOUNTER — Encounter: Payer: Self-pay | Admitting: Hematology & Oncology

## 2020-10-17 ENCOUNTER — Encounter: Payer: Self-pay | Admitting: *Deleted

## 2020-10-17 ENCOUNTER — Other Ambulatory Visit: Payer: Self-pay

## 2020-10-17 ENCOUNTER — Inpatient Hospital Stay (HOSPITAL_BASED_OUTPATIENT_CLINIC_OR_DEPARTMENT_OTHER): Payer: Medicare Other | Admitting: Hematology & Oncology

## 2020-10-17 ENCOUNTER — Inpatient Hospital Stay: Payer: Medicare Other | Attending: Hematology & Oncology

## 2020-10-17 VITALS — BP 121/55 | HR 72 | Temp 98.4°F | Resp 18 | Wt 130.0 lb

## 2020-10-17 DIAGNOSIS — R197 Diarrhea, unspecified: Secondary | ICD-10-CM | POA: Diagnosis not present

## 2020-10-17 DIAGNOSIS — C787 Secondary malignant neoplasm of liver and intrahepatic bile duct: Secondary | ICD-10-CM | POA: Diagnosis not present

## 2020-10-17 DIAGNOSIS — C259 Malignant neoplasm of pancreas, unspecified: Secondary | ICD-10-CM

## 2020-10-17 DIAGNOSIS — Z5111 Encounter for antineoplastic chemotherapy: Secondary | ICD-10-CM | POA: Diagnosis present

## 2020-10-17 DIAGNOSIS — E1165 Type 2 diabetes mellitus with hyperglycemia: Secondary | ICD-10-CM

## 2020-10-17 DIAGNOSIS — Z452 Encounter for adjustment and management of vascular access device: Secondary | ICD-10-CM | POA: Diagnosis not present

## 2020-10-17 DIAGNOSIS — E1142 Type 2 diabetes mellitus with diabetic polyneuropathy: Secondary | ICD-10-CM

## 2020-10-17 LAB — CBC WITH DIFFERENTIAL (CANCER CENTER ONLY)
Abs Immature Granulocytes: 0.03 10*3/uL (ref 0.00–0.07)
Basophils Absolute: 0.1 10*3/uL (ref 0.0–0.1)
Basophils Relative: 1 %
Eosinophils Absolute: 0.3 10*3/uL (ref 0.0–0.5)
Eosinophils Relative: 4 %
HCT: 37.1 % — ABNORMAL LOW (ref 39.0–52.0)
Hemoglobin: 12.4 g/dL — ABNORMAL LOW (ref 13.0–17.0)
Immature Granulocytes: 0 %
Lymphocytes Relative: 23 %
Lymphs Abs: 1.5 10*3/uL (ref 0.7–4.0)
MCH: 32.6 pg (ref 26.0–34.0)
MCHC: 33.4 g/dL (ref 30.0–36.0)
MCV: 97.6 fL (ref 80.0–100.0)
Monocytes Absolute: 0.9 10*3/uL (ref 0.1–1.0)
Monocytes Relative: 13 %
Neutro Abs: 3.9 10*3/uL (ref 1.7–7.7)
Neutrophils Relative %: 59 %
Platelet Count: 150 10*3/uL (ref 150–400)
RBC: 3.8 MIL/uL — ABNORMAL LOW (ref 4.22–5.81)
RDW: 15.7 % — ABNORMAL HIGH (ref 11.5–15.5)
WBC Count: 6.8 10*3/uL (ref 4.0–10.5)
nRBC: 0 % (ref 0.0–0.2)

## 2020-10-17 LAB — CMP (CANCER CENTER ONLY)
ALT: 47 U/L — ABNORMAL HIGH (ref 0–44)
AST: 37 U/L (ref 15–41)
Albumin: 3.8 g/dL (ref 3.5–5.0)
Alkaline Phosphatase: 425 U/L — ABNORMAL HIGH (ref 38–126)
Anion gap: 7 (ref 5–15)
BUN: 16 mg/dL (ref 6–20)
CO2: 27 mmol/L (ref 22–32)
Calcium: 10.1 mg/dL (ref 8.9–10.3)
Chloride: 100 mmol/L (ref 98–111)
Creatinine: 0.61 mg/dL (ref 0.61–1.24)
GFR, Estimated: >60 mL/min (ref >60)
Glucose, Bld: 280 mg/dL — ABNORMAL HIGH (ref 70–99)
Potassium: 4.3 mmol/L (ref 3.5–5.1)
Sodium: 134 mmol/L — ABNORMAL LOW (ref 135–145)
Total Bilirubin: 1.5 mg/dL — ABNORMAL HIGH (ref 0.3–1.2)
Total Protein: 7.3 g/dL (ref 6.5–8.1)

## 2020-10-17 LAB — PREALBUMIN: Prealbumin: 15 mg/dL — ABNORMAL LOW (ref 18–38)

## 2020-10-17 MED ORDER — HEPARIN SOD (PORK) LOCK FLUSH 100 UNIT/ML IV SOLN
500.0000 [IU] | Freq: Once | INTRAVENOUS | Status: AC
  Administered 2020-10-17: 500 [IU] via INTRAVENOUS

## 2020-10-17 MED ORDER — SODIUM CHLORIDE 0.9% FLUSH
10.0000 mL | Freq: Once | INTRAVENOUS | Status: AC
  Administered 2020-10-17: 10 mL via INTRAVENOUS

## 2020-10-17 MED ORDER — PANCRELIPASE (LIP-PROT-AMYL) 36000-114000 UNITS PO CPEP: ORAL_CAPSULE | ORAL | 11 refills | Status: DC

## 2020-10-17 NOTE — Addendum Note (Signed)
Addended by: Shelda Altes on: 10/17/2020 10:56 AM   Modules accepted: Orders

## 2020-10-17 NOTE — Progress Notes (Signed)
Patient continues to have poor tolerance to his treatment. Dr Marin Olp will hold today's treatment and make adjustments to his regimen.   Oncology Nurse Navigator Documentation  Oncology Nurse Navigator Flowsheets 10/17/2020  Abnormal Finding Date -  Confirmed Diagnosis Date -  Diagnosis Status -  Planned Course of Treatment -  Phase of Treatment -  Chemotherapy Actual Start Date: -  Navigator Follow Up Date: 11/21/2020  Navigator Follow Up Reason: Follow-up Appointment;Chemotherapy  Navigator Location [facility]  Referral Date to RadOnc/MedOnc -  Navigator Encounter Type Appt/Treatment Plan Review  Telephone -  Treatment Initiated Date -  Patient Visit Type MedOnc  Treatment Phase Active Tx  Barriers/Navigation Needs Coordination of Care;Education  Education -  Interventions None Required  Acuity Level 2-Minimal Needs (1-2 Barriers Identified)  Coordination of Care -  Education Method -  Support Groups/Services Friends and Family  Time Spent with Patient 30

## 2020-10-17 NOTE — Progress Notes (Signed)
Hematology and Oncology Follow Up Visit  Dean Singh [id number] 1960/11/30 60 y.o. 10/17/2020   Principle Diagnosis:  Metastatic adenocarcinoma the pancreas-hepatic metastasis  Current Therapy:   FOLFIRINOX-s/p cycle #2 - start on 05/20/2020 --d/c on 07/16/2020 due to toxicity Abraxane/Gemzar -- s/p cycle #2 -- start on 07/25/2020 --we will drop day #15 of treatment starting with cycle 3.     Interim History:  Dean Singh is back for follow-up.  He still is having problems.  He still having some diarrhea.  He is weak.  He has gained a little bit of weight which is somewhat encouraging.  His blood sugars are still horrible.  We are going to have to make an adjustment with his protocol.  I think that the easiest thing to do is to try to drop day #15 of each cycle.  Maybe this will help with some of the side effects.  Think he has responded to treatment.  His CA 19-9 has come down quite nicely.  When we last saw him, the CA 19-9 was down to 9200.  He has had no bleeding.  He has had some occasional cough and shortness of breath.  He has had some pain issues.  I think he is on the fentanyl.  I will try him on Creon to see if this will help.  He has had no leg swelling.  He has had no rashes.    We did do a CT scan on 09/26/2020.  This showed stability of the distal pancreatic lesion.  There was stable to slight decrease in size of liver metastasis.  Medications:  Current Outpatient Medications:    aspirin EC 81 MG tablet, Take 1 tablet (81 mg total) by mouth daily., Disp:  , Rfl:    clopidogrel (PLAVIX) 75 MG tablet, Take 1 tablet (75 mg total) by mouth daily. NEED APPOINTMENT, Disp: 30 tablet, Rfl: 0   diphenoxylate-atropine (LOMOTIL) 2.5-0.025 MG tablet, Take 2 tablets by mouth 4 (four) times daily as needed for diarrhea or loose stools., Disp: 100 tablet, Rfl: 0   dronabinol (MARINOL) 5 MG capsule, TAKE 1 CAPSULE (5 MG TOTAL) BY MOUTH 2 (TWO) TIMES DAILY BEFORE LUNCH AND SUPPER.,  Disp: 60 capsule, Rfl: 0   FARXIGA 5 MG TABS tablet, TAKE 1 TABLET BY MOUTH EVERY DAY BEFORE BREAKFAST, Disp: 30 tablet, Rfl: 2   fentaNYL (DURAGESIC) 50 MCG/HR, Place 1 patch onto the skin every 3 (three) days., Disp: 10 patch, Rfl: 0   glucose blood (ONETOUCH ULTRA) test strip, USE TWICE DAILY AS DIRECTED ( E11.65), Disp: 200 each, Rfl: 12   Insulin Glargine (BASAGLAR KWIKPEN) 100 UNIT/ML, Inject 20 Units into the skin daily. (Patient taking differently: Inject 22 Units into the skin daily.), Disp: 30 mL, Rfl: 4   Insulin Pen Needle 32G X 4 MM MISC, 1 Device by Does not apply route daily., Disp: 50 each, Rfl: 6   JANUVIA 100 MG tablet, TAKE 1 TABLET BY MOUTH EVERY DAY, Disp: 90 tablet, Rfl: 1   lidocaine-prilocaine (EMLA) cream, APPLY TO AFFECTED AREA ONCE AS DIRECTED, Disp: 30 g, Rfl: 3   lipase/protease/amylase (CREON) 36000 UNITS CPEP capsule, Take 2 capsules (72,000 Units total) by mouth 3 (three) times daily with meals. May also take 1 capsule (36,000 Units total) as needed (with snacks)., Disp: 240 capsule, Rfl: 11   loperamide (IMODIUM) 2 MG capsule, TAKE 2 AT ONSET OF DIARRHEA, THEN 1 EVERY 2 HOURS UNTIL 12 HOURS WITHOUT A BOWEL MOEVENT. MAY TAKE 2 TAB  EVERY 4 HOURS AT BEDTIME. IF DIARRHEA RECURS REPEAT., Disp: 100 capsule, Rfl: 1   LORazepam (ATIVAN) 0.5 MG tablet, Take 1 tablet (0.5 mg total) by mouth every 6 (six) hours as needed (Nausea or vomiting)., Disp: 30 tablet, Rfl: 0   metFORMIN (GLUCOPHAGE) 1000 MG tablet, Take 1 tablet (1,000 mg total) by mouth daily with breakfast., Disp: 90 tablet, Rfl: 3   ondansetron (ZOFRAN) 8 MG tablet, TAKE 1 TABLET (8 MG TOTAL) BY MOUTH 2 (TWO) TIMES DAILY AS NEEDED (NAUSEA OR VOMITING)., Disp: 30 tablet, Rfl: 1   pantoprazole (PROTONIX) 40 MG tablet, Take 1 tablet (40 mg total) by mouth daily., Disp: 90 tablet, Rfl: 3   Wheat Dextrin (BENEFIBER PO), Take 1 Scoop by mouth daily., Disp: , Rfl:    cilostazol (PLETAL) 100 MG tablet, TAKE 1 TABLET BY MOUTH  TWICE A DAY, Disp: 180 tablet, Rfl: 1   hyoscyamine (LEVSIN SL) 0.125 MG SL tablet, Place 1 tablet (0.125 mg total) under the tongue every 4 (four) hours as needed. (Patient not taking: No sig reported), Disp: 20 tablet, Rfl: 1   mupirocin ointment (BACTROBAN) 2 %, Place 1 application into the nose at bedtime. (Patient not taking: Reported on 10/17/2020), Disp: 22 g, Rfl: 1   nirmatrelvir/ritonavir EUA (PAXLOVID) 20 x 150 MG & 10 x 100MG  TABS, (Take nirmatrelvir 150 mg two tablets twice daily for 5 days and ritonavir 100 mg one tablet twice daily for 5 days) Patient GFR is 0.6, Disp: 30 tablet, Rfl: 0   nitroGLYCERIN (NITROSTAT) 0.4 MG SL tablet, Place 0.4 mg under the tongue every 5 (five) minutes as needed for chest pain. (Patient not taking: No sig reported), Disp: , Rfl:    ondansetron (ZOFRAN) 4 MG tablet, Take 1 tablet (4 mg total) by mouth every 8 (eight) hours as needed for nausea or vomiting. (Patient not taking: Reported on 10/17/2020), Disp: 40 tablet, Rfl: 0   pregabalin (LYRICA) 300 MG capsule, TAKE 1 CAPSULE BY MOUTH EVERYDAY AT BEDTIME, Disp: 30 capsule, Rfl: 5   prochlorperazine (COMPAZINE) 10 MG tablet, Take 1 tablet (10 mg total) by mouth every 6 (six) hours as needed (Nausea or vomiting). (Patient not taking: No sig reported), Disp: 30 tablet, Rfl: 1   Saw Palmetto, Serenoa repens, (SAW PALMETTO PO), Take 1 capsule by mouth daily as needed (When having prostate problems).  (Patient not taking: Reported on 10/17/2020), Disp: , Rfl:    sucralfate (CARAFATE) 1 GM/10ML suspension, TAKE 10 MLS (1 G TOTAL) BY MOUTH 4 (FOUR) TIMES DAILY - WITH MEALS AND AT BEDTIME. (Patient not taking: Reported on 10/17/2020), Disp: 420 mL, Rfl: 1   SUMAtriptan (IMITREX) 25 MG tablet, TAKE 1 TABLET BY MOUTH EVERY 2 HOURS AS NEEDED FOR MIGRAINE. (Patient not taking: Reported on 10/17/2020), Disp: 12 tablet, Rfl: 2  Allergies:  Allergies  Allergen Reactions   Statins Itching    Atorvastatin cause "my skin  to feel horrible and unbearable".   Morphine Nausea And Vomiting    "felt like chest was exploding" pt reports     Past Medical History, Surgical history, Social history, and Family History were reviewed and updated.  Review of Systems: Review of Systems  Constitutional:  Positive for fatigue.  HENT:  Negative.    Eyes: Negative.   Respiratory: Negative.    Cardiovascular: Negative.   Gastrointestinal:  Positive for abdominal pain.  Endocrine: Negative.   Musculoskeletal:  Positive for arthralgias, flank pain and myalgias.  Skin: Negative.   Neurological: Negative.  Hematological: Negative.   Psychiatric/Behavioral: Negative.     Physical Exam:  vitals were not taken for this visit.   Wt Readings from Last 3 Encounters:  10/17/20 130 lb (59 kg)  09/17/20 125 lb 12 oz (57 kg)  08/22/20 132 lb (59.9 kg)    Physical Exam Vitals reviewed.  HENT:     Head: Normocephalic and atraumatic.  Eyes:     Pupils: Pupils are equal, round, and reactive to light.  Cardiovascular:     Rate and Rhythm: Normal rate and regular rhythm.     Heart sounds: Normal heart sounds.  Pulmonary:     Effort: Pulmonary effort is normal.     Breath sounds: Normal breath sounds.  Abdominal:     General: Bowel sounds are normal.     Palpations: Abdomen is soft.  Musculoskeletal:        General: No tenderness or deformity. Normal range of motion.     Cervical back: Normal range of motion.  Lymphadenopathy:     Cervical: No cervical adenopathy.  Skin:    General: Skin is warm and dry.     Findings: No erythema or rash.  Neurological:     Mental Status: He is alert and oriented to person, place, and time.  Psychiatric:        Behavior: Behavior normal.        Thought Content: Thought content normal.        Judgment: Judgment normal.     Lab Results  Component Value Date   WBC 6.8 10/17/2020   HGB 12.4 (L) 10/17/2020   HCT 37.1 (L) 10/17/2020   MCV 97.6 10/17/2020   PLT 150 10/17/2020      Chemistry      Component Value Date/Time   NA 134 (L) 10/17/2020 0940   NA 132 (L) 05/01/2019 1108   K 4.3 10/17/2020 0940   CL 100 10/17/2020 0940   CO2 27 10/17/2020 0940   BUN 16 10/17/2020 0940   BUN 18 05/01/2019 1108   CREATININE 0.61 10/17/2020 0940      Component Value Date/Time   CALCIUM 10.1 10/17/2020 0940   ALKPHOS 425 (H) 10/17/2020 0940   AST 37 10/17/2020 0940   ALT 47 (H) 10/17/2020 0940   BILITOT 1.5 (H) 10/17/2020 0940      Impression and Plan: Mr. Diffee is a very nice 60 year old white male.  He has metastatic adenocarcinoma the pancreas.  He has extensive liver metastasis.  I really hate the fact that he is still having problems.  This is all quality of life.  I just wish that his quality of life would be a little bit better.  Again, may be if we dropped day 15 of treatment, this may not be a bad idea.  We will not treat him today.  I will give him 1 week off.  I would like to see him back when he starts his fourth cycle of treatment in November.  Hopefully, we can arrange his schedule so that he can enjoy the Thanksgiving holiday.     Volanda Napoleon, MD 10/13/20221:55 PM

## 2020-10-18 ENCOUNTER — Encounter: Payer: Self-pay | Admitting: Internal Medicine

## 2020-10-18 ENCOUNTER — Ambulatory Visit (INDEPENDENT_AMBULATORY_CARE_PROVIDER_SITE_OTHER): Payer: Medicare Other | Admitting: Internal Medicine

## 2020-10-18 ENCOUNTER — Encounter: Payer: Self-pay | Admitting: Hematology & Oncology

## 2020-10-18 ENCOUNTER — Other Ambulatory Visit: Payer: Self-pay | Admitting: Internal Medicine

## 2020-10-18 ENCOUNTER — Telehealth: Payer: Self-pay | Admitting: *Deleted

## 2020-10-18 VITALS — BP 102/70 | HR 65 | Ht 63.0 in | Wt 129.0 lb

## 2020-10-18 DIAGNOSIS — Z794 Long term (current) use of insulin: Secondary | ICD-10-CM | POA: Diagnosis not present

## 2020-10-18 DIAGNOSIS — E1142 Type 2 diabetes mellitus with diabetic polyneuropathy: Secondary | ICD-10-CM

## 2020-10-18 DIAGNOSIS — G63 Polyneuropathy in diseases classified elsewhere: Secondary | ICD-10-CM | POA: Diagnosis not present

## 2020-10-18 DIAGNOSIS — E1159 Type 2 diabetes mellitus with other circulatory complications: Secondary | ICD-10-CM

## 2020-10-18 DIAGNOSIS — E119 Type 2 diabetes mellitus without complications: Secondary | ICD-10-CM | POA: Insufficient documentation

## 2020-10-18 DIAGNOSIS — E1165 Type 2 diabetes mellitus with hyperglycemia: Secondary | ICD-10-CM | POA: Diagnosis not present

## 2020-10-18 LAB — GLUCOSE, POCT (MANUAL RESULT ENTRY): POC Glucose: 287 mg/dl — AB (ref 70–99)

## 2020-10-18 LAB — CANCER ANTIGEN 19-9: CA 19-9: 6533 U/mL — ABNORMAL HIGH (ref 0–35)

## 2020-10-18 MED ORDER — INSULIN LISPRO (1 UNIT DIAL) 100 UNIT/ML (KWIKPEN): 6.0000 [IU] | PEN_INJECTOR | Freq: Three times a day (TID) | SUBCUTANEOUS | 11 refills | Status: DC

## 2020-10-18 MED ORDER — DAPAGLIFLOZIN PROPANEDIOL 5 MG PO TABS: 5.0000 mg | ORAL_TABLET | Freq: Every day | ORAL | 1 refills | Status: AC

## 2020-10-18 MED ORDER — INSULIN PEN NEEDLE 32G X 4 MM MISC: 1.0000 | Freq: Four times a day (QID) | 3 refills | Status: AC

## 2020-10-18 MED ORDER — PREGABALIN 300 MG PO CAPS: ORAL_CAPSULE | ORAL | 5 refills | Status: AC

## 2020-10-18 MED ORDER — NOVOLOG FLEXPEN 100 UNIT/ML ~~LOC~~ SOPN: 6.0000 [IU] | PEN_INJECTOR | Freq: Three times a day (TID) | SUBCUTANEOUS | 6 refills | Status: AC

## 2020-10-18 NOTE — Addendum Note (Signed)
Addended by: Dorita Sciara on: 10/18/2020 03:40 PM   Modules accepted: Orders

## 2020-10-18 NOTE — Progress Notes (Signed)
Name: Dean Singh. Dean Singh Sex: 60 y.o., male   MRN/ DOB: [id number], 1960-07-21     PCP: Mosie Lukes, MD   Reason for Endocrinology Evaluation: Type 2 Diabetes Mellitus  Initial Endocrine Consultative Visit: 08/30/2019    PATIENT IDENTIFIER: Dean Singh. Hartog is a 60 y.o. male with a past medical history of HTN, T2DM, CAD, and pancreatic cancer. The patient has followed with Endocrinology clinic since 08/30/2019 for consultative assistance with management of his diabetes.  DIABETIC HISTORY:  Dean Singh was diagnosed with DM in 2018. He has been on oral glycemic agents for a while, Januvia started in 2021. His hemoglobin A1c has ranged from 9.3% in 2020, peaking at 14.1% in 2021  On his initial visit to our clinic he had an A1c of 14.1 % . He was on Metformin , glipizide, and Januvia . We stopped Glipizide, started basal insulin and continued Metformin and Januvia   SUBJECTIVE:   During the last visit (12/04/2019) : A1c 11.0 % .  We adjusted basal insulin, continued Metformin and Januvia and started [place]  Today (10/18/2020): Dean Singh is here for a follow up on diabetes management.  The patient has not been to our clinic in 11 months He checks his blood sugars occasionally . The patient has not had hypoglycemic episodes since the last clinic visit  Since his last visit here he has been diagnosed with pancreatic cancer with hepatic metastases, currently on chemotherapy   Takes Lyrica daily for neuropathy  He has lost weight since then his cancer diagnosis  He has chronic diarrhea , but has good appetite   He had orange juice this morning    HOME DIABETES REGIMEN:  Basaglar 22 units daily  Metformin 1000 mg , 1 tablet daily Januvia 100 mg daily  Farxiga 5 mg daily    Statin: yes ACE-I/ARB: no   METER DOWNLOAD SUMMARY: Did not bring    DIABETIC COMPLICATIONS: Microvascular complications:  Neuropathy Denies: CKD Last Eye Exam: Completed yrs ago  Macrovascular  complications:    CVA, CAD ( S/P stent replacement ) Denies: CAD, CVA, PVD   HISTORY:  Past Medical History:  Past Medical History:  Diagnosis Date   Arthritis    DDD   Bladder cancer ([facility])    Bladder cancer ([facility]) 08/17/2018   CAD in native artery 08/17/2018   Current nicotine use 08/17/2018   Diabetes mellitus without complication (HCC)    GERD (gastroesophageal reflux disease)    Goals of care, counseling/discussion 04/24/2020   H/O: stroke    Heart attack ([place])    2 stents   Hx of hemorrhoids 08/17/2018   Hyperlipidemia    Hypertension    Pancreatic cancer metastasized to liver ([facility]) 04/24/2020   Peripheral neuropathy 08/17/2018   Stroke ([facility]) 57   Visual changes 08/17/2018   Past Surgical History:  Past Surgical History:  Procedure Laterality Date   ABDOMINAL AORTOGRAM W/LOWER EXTREMITY Bilateral 02/08/2020   Procedure: ABDOMINAL AORTOGRAM W/LOWER EXTREMITY;  Surgeon: Marty Heck, MD;  Location: [facility];  Service: Cardiovascular;  Laterality: Bilateral;   BLADDER TUMOR EXCISION     cancer   CAROTID ENDARTERECTOMY Left    CORONARY ANGIOPLASTY WITH STENT PLACEMENT     2 stent   CORONARY STENT INTERVENTION N/A 09/02/2018   Procedure: CORONARY STENT INTERVENTION;  Surgeon: Sherren Mocha, MD;  Location: [facility];  Service: Cardiovascular;  Laterality: N/A;   ESOPHAGOGASTRODUODENOSCOPY (EGD) WITH PROPOFOL N/A 05/02/2020  Procedure: ESOPHAGOGASTRODUODENOSCOPY (EGD) WITH PROPOFOL;  Surgeon: [name], MD;  Location: [facility];  Service: Endoscopy;  Laterality: N/A;   FINE NEEDLE ASPIRATION N/A 05/02/2020   Procedure: FINE NEEDLE ASPIRATION (FNA) LINEAR;  Surgeon: [name], MD;  Location: [facility];  Service: Endoscopy;  Laterality: N/A;   HERNIA REPAIR     right inguinal 2017   IR IMAGING GUIDED PORT INSERTION  05/15/2020   LEFT HEART CATH AND CORONARY ANGIOGRAPHY N/A 09/02/2018   Procedure: LEFT HEART CATH AND CORONARY  ANGIOGRAPHY;  Surgeon: Sherren Mocha, MD;  Location: [facility];  Service: Cardiovascular;  Laterality: N/A;   UPPER ESOPHAGEAL ENDOSCOPIC ULTRASOUND (EUS) N/A 05/02/2020   Procedure: UPPER ESOPHAGEAL ENDOSCOPIC ULTRASOUND (EUS);  Surgeon: [name], MD;  Location: [facility];  Service: Endoscopy;  Laterality: N/A;   Social History:  reports that he has been smoking cigarettes. He has been smoking an average of 1 pack per day. He has quit using smokeless tobacco.  His smokeless tobacco use included chew. He reports that he does not currently use alcohol. He reports that he does not currently use drugs. Family History:  Family History  Problem Relation Age of Onset   COPD Mother    Diabetes Mother    Dementia Mother    Heart disease Mother        chf   Arthritis Mother    Osteoporosis Mother        h/o cigarette smoking   Heart disease Father        stents 3 x 2, pacer, chf   Parkinson's disease Father    Hyperlipidemia Father    Diabetes Father    Arthritis Father        DDD   Osteoporosis Father        h/o cigarette use   Addison's disease Sister    Diabetes Sister    Hyperlipidemia Sister    Hypertension Sister    Obesity Sister    Hyperlipidemia Brother    Hypertension Brother    Diabetes Brother    Obesity Brother    Rheumatic fever Son    Stickler syndrome Son    Diabetes Maternal Grandmother    Dementia Maternal Grandmother    Heart disease Maternal Grandmother        chf   Hyperlipidemia Maternal Grandmother    Diabetes Maternal Grandfather    Dementia Maternal Grandfather    Heart disease Maternal Grandfather        cad   Hyperlipidemia Maternal Grandfather    Other Paternal Grandmother        scarlet fever   Prostate cancer Paternal Grandfather    Obesity Brother      HOME MEDICATIONS: Allergies as of 10/18/2020       Reactions   Statins Itching   Atorvastatin cause "my skin to feel horrible and unbearable".   Morphine Nausea And  Vomiting   "felt like chest was exploding" pt reports        Medication List        Accurate as of October 18, 2020  1:35 PM. If you have any questions, ask your nurse or doctor.          STOP taking these medications    Januvia 100 MG tablet Generic drug: sitaGLIPtin Stopped by: Dorita Sciara, MD   metFORMIN 1000 MG tablet Commonly known as: GLUCOPHAGE Stopped by: Dorita Sciara, MD   nirmatrelvir/ritonavir EUA 20 x 150 MG & 10  x 100MG  Tabs Commonly known as: PAXLOVID Stopped by: Dorita Sciara, MD       TAKE these medications    aspirin EC 81 MG tablet Take 1 tablet (81 mg total) by mouth daily.   Basaglar KwikPen 100 UNIT/ML Inject 20 Units into the skin daily. What changed: how much to take   BENEFIBER PO Take 1 Scoop by mouth daily.   cilostazol 100 MG tablet Commonly known as: PLETAL TAKE 1 TABLET BY MOUTH TWICE A DAY   clopidogrel 75 MG tablet Commonly known as: PLAVIX Take 1 tablet (75 mg total) by mouth daily. NEED APPOINTMENT   dapagliflozin propanediol 5 MG Tabs tablet Commonly known as: Farxiga Take 1 tablet (5 mg total) by mouth daily. What changed: See the new instructions. Changed by: Dorita Sciara, MD   diphenoxylate-atropine 2.5-0.025 MG tablet Commonly known as: LOMOTIL Take 2 tablets by mouth 4 (four) times daily as needed for diarrhea or loose stools.   dronabinol 5 MG capsule Commonly known as: MARINOL TAKE 1 CAPSULE (5 MG TOTAL) BY MOUTH 2 (TWO) TIMES DAILY BEFORE LUNCH AND SUPPER.   fentaNYL 50 MCG/HR Commonly known as: [place] 1 patch onto the skin every 3 (three) days.   hyoscyamine 0.125 MG SL tablet Commonly known as: LEVSIN SL Place 1 tablet (0.125 mg total) under the tongue every 4 (four) hours as needed.   insulin lispro 100 UNIT/ML KwikPen Commonly known as: HumaLOG KwikPen Inject 6 Units into the skin 3 (three) times daily. Started by: Dorita Sciara, MD   Insulin  Pen Needle 32G X 4 MM Misc 1 Device by Does not apply route daily.   lidocaine-prilocaine cream Commonly known as: EMLA APPLY TO AFFECTED AREA ONCE AS DIRECTED   lipase/protease/amylase 36000 UNITS Cpep capsule Commonly known as: Creon Take 2 capsules (72,000 Units total) by mouth 3 (three) times daily with meals. May also take 1 capsule (36,000 Units total) as needed (with snacks).   loperamide 2 MG capsule Commonly known as: IMODIUM TAKE 2 AT ONSET OF DIARRHEA, THEN 1 EVERY 2 HOURS UNTIL 12 HOURS WITHOUT A BOWEL MOEVENT. MAY TAKE 2 TAB EVERY 4 HOURS AT BEDTIME. IF DIARRHEA RECURS REPEAT.   LORazepam 0.5 MG tablet Commonly known as: Ativan Take 1 tablet (0.5 mg total) by mouth every 6 (six) hours as needed (Nausea or vomiting).   mupirocin ointment 2 % Commonly known as: BACTROBAN Place 1 application into the nose at bedtime.   nitroGLYCERIN 0.4 MG SL tablet Commonly known as: NITROSTAT Place 0.4 mg under the tongue every 5 (five) minutes as needed for chest pain.   ondansetron 4 MG tablet Commonly known as: Zofran Take 1 tablet (4 mg total) by mouth every 8 (eight) hours as needed for nausea or vomiting.   ondansetron 8 MG tablet Commonly known as: ZOFRAN TAKE 1 TABLET (8 MG TOTAL) BY MOUTH 2 (TWO) TIMES DAILY AS NEEDED (NAUSEA OR VOMITING).   OneTouch Ultra test strip Generic drug: glucose blood USE TWICE DAILY AS DIRECTED ( E11.65)   pantoprazole 40 MG tablet Commonly known as: PROTONIX Take 1 tablet (40 mg total) by mouth daily.   pregabalin 300 MG capsule Commonly known as: LYRICA TAKE 1 CAPSULE BY MOUTH EVERYDAY AT BEDTIME   prochlorperazine 10 MG tablet Commonly known as: COMPAZINE Take 1 tablet (10 mg total) by mouth every 6 (six) hours as needed (Nausea or vomiting).   SAW PALMETTO PO Take 1 capsule by mouth daily as needed (When having  prostate problems).   sucralfate 1 GM/10ML suspension Commonly known as: CARAFATE TAKE 10 MLS (1 G TOTAL) BY MOUTH 4  (FOUR) TIMES DAILY - WITH MEALS AND AT BEDTIME.   SUMAtriptan 25 MG tablet Commonly known as: IMITREX TAKE 1 TABLET BY MOUTH EVERY 2 HOURS AS NEEDED FOR MIGRAINE.         OBJECTIVE:   Vital Signs: BP 102/70 (BP Location: Left Arm, Patient Position: Sitting, Cuff Size: Small)   Pulse 65   Ht 5\' 3"  (1.6 m)   Wt 129 lb (58.5 kg)   SpO2 95%   BMI 22.85 kg/m   Wt Readings from Last 3 Encounters:  10/18/20 129 lb (58.5 kg)  10/17/20 130 lb (59 kg)  09/17/20 125 lb 12 oz (57 kg)     Exam: General: Pt appears well and is in NAD  Lungs: Clear with good BS bilat with no rales, rhonchi, or wheezes  Heart: RRR with normal S1 and S2 and no gallops; no murmurs; no rub  Abdomen: Normoactive bowel sounds, soft, nontender, without masses or organomegaly palpable  Extremities: No pretibial edema.   Neuro: MS is good with appropriate affect, pt is alert and Ox3    DM foot exam:  10/18/2020  The skin of the feet is intact without sores or ulcerations. The pedal pulses are undetectable The sensation is decreased to a screening 5.07, 10 gram monofilament bilaterally        DATA REVIEWED:  Lab Results  Component Value Date   HGBA1C 9.3 (H) 09/17/2020   HGBA1C 10.5 (H) 04/01/2020   HGBA1C 11.0 (A) 12/04/2019   Lab Results  Component Value Date   MICROALBUR 2.1 08/11/2019   LDLCALC 93 05/01/2019   CREATININE 0.61 10/17/2020     Lab Results  Component Value Date   CHOL 130 04/01/2020   HDL 39.20 04/01/2020   LDLCALC 93 05/01/2019   LDLDIRECT 62.0 04/01/2020   TRIG 217.0 (H) 04/01/2020   CHOLHDL 3 04/01/2020         ASSESSMENT / PLAN / RECOMMENDATIONS:   1) Type 2 Diabetes Mellitus,Poorly controlled, With neuropathic and macrovascular  complications - Most recent A1c of 9.3 %. Goal A1c < 7.0 %.    - A1c down from 10.5 % - Given active treatment for pancreatic cancer , will stop Januvia  - Will also stop Metformin due to chronic diarrhea, could be a confounding  factor  - CGM cost prohibitive  - Pt agreed to starting prandial insulin  - [employer], will not increase as it will increase weight loss    MEDICATIONS: -  STOP Januvia  - STOP Metformin  - Start Humalog 6 units with each meal  - Continue  Basaglar 22 units daily  - Continue Farxiga 5 mg , 1 tablet  - Continue Lyrica 300 mg at bedtime     EDUCATION / INSTRUCTIONS: BG monitoring instructions: Patient is instructed to check his blood sugars 1 times a day, fasting . Call [facility] if: BG persistently < 70  I reviewed the Rule of 15 for the treatment of hypoglycemia in detail with the patient. Literature supplied.    2) Diabetic complications:  Eye: Does not have known diabetic retinopathy.  Neuro/ Feet: Does have known diabetic peripheral neuropathy .  Renal: Patient does not have known baseline CKD. He   is not on an ACEI/ARB at present.    3) Peripheral Neuropathy :  - Needs a refill on Lyrica    Medication  Continue  Lyrica 300 mg QHS    4) Intermittent Claudication:  - Pt on rosuvastatin  - Will proceed with ABI    F/U in 3 months    Signed electronically by: Mack Guise, MD  [facility]  [facility] [address] [place],  [address] Phone: [phone] FAX: [phone]   CC: Mosie Lukes, [address] [place] [address] Phone: [phone]  Fax: [phone]  Return to Endocrinology clinic as below: Future Appointments  Date Time Provider [place]  10/24/2020  9:00 AM [facility] LAB [facility] None  10/24/2020  9:15 AM [facility] INJ NURSE [facility] None  10/24/2020  9:30 AM [facility] B4 [facility] None  11/21/2020  9:45 AM [facility] LAB [facility] None  11/21/2020 10:00 AM [facility] INJ NURSE [facility] None  11/21/2020 10:15 AM [place], Rudell Cobb, MD [facility] None  11/21/2020 10:30 AM [facility] A3 [facility] None

## 2020-10-18 NOTE — Telephone Encounter (Signed)
-----   Message from Volanda Napoleon, MD sent at 10/18/2020 10:12 AM EDT ----- Please call and let him know that the tumor marker is now down to 6500.  This is amazing.  This is God at work.  [name]

## 2020-10-18 NOTE — Telephone Encounter (Signed)
As noted below by Dr. Marin Olp, I informed the patient that the tumor marker is down to 6500. He verbalized understanding.

## 2020-10-18 NOTE — Patient Instructions (Addendum)
-   STOP Januvia  - STOP Metformin  - Start Humalog 6 units with each meal  - Continue  Basaglar 22 units daily  - Continue Farxiga 5 mg , 1 tablet  - Continue Lyrica 300 mg at bedtime      HOW TO TREAT LOW BLOOD SUGARS (Blood sugar LESS THAN 70 MG/DL) Please follow the RULE OF 15 for the treatment of hypoglycemia treatment (when your (blood sugars are less than 70 mg/dL)   STEP 1: Take 15 grams of carbohydrates when your blood sugar is low, which includes:  3-4 GLUCOSE TABS  OR 3-4 OZ OF JUICE OR REGULAR SODA OR ONE TUBE OF GLUCOSE GEL    STEP 2: RECHECK blood sugar in 15 MINUTES STEP 3: If your blood sugar is still low at the 15 minute recheck --> then, go back to STEP 1 and treat AGAIN with another 15 grams of carbohydrates.

## 2020-10-18 NOTE — Telephone Encounter (Signed)
Per 10/18/20 los - called and gave upcoming appointments - confirmed - view mychart

## 2020-10-21 ENCOUNTER — Encounter: Payer: Self-pay | Admitting: Internal Medicine

## 2020-10-22 ENCOUNTER — Other Ambulatory Visit: Payer: Self-pay | Admitting: Internal Medicine

## 2020-10-22 ENCOUNTER — Other Ambulatory Visit (HOSPITAL_COMMUNITY): Payer: Self-pay

## 2020-10-22 ENCOUNTER — Other Ambulatory Visit: Payer: Self-pay

## 2020-10-22 MED ORDER — DEXCOM G6 RECEIVER DEVI: 0 refills | Status: DC

## 2020-10-22 MED ORDER — DEXCOM G6 SENSOR MISC: 1.0000 | 3 refills | Status: DC

## 2020-10-22 MED ORDER — DEXCOM G6 SENSOR MISC: 1.0000 | 3 refills | Status: AC

## 2020-10-22 MED ORDER — DEXCOM G6 TRANSMITTER MISC: 1.0000 | 3 refills | Status: DC

## 2020-10-22 MED ORDER — DEXCOM G6 RECEIVER DEVI: 0 refills | Status: AC

## 2020-10-22 MED ORDER — DEXCOM G6 TRANSMITTER MISC: 1.0000 | 3 refills | Status: AC

## 2020-10-22 NOTE — Telephone Encounter (Signed)
Script sent to DME supply company

## 2020-10-24 ENCOUNTER — Inpatient Hospital Stay: Payer: Medicare Other

## 2020-10-24 ENCOUNTER — Other Ambulatory Visit: Payer: Self-pay

## 2020-10-24 VITALS — BP 101/56 | HR 73 | Temp 98.4°F | Resp 16

## 2020-10-24 DIAGNOSIS — C787 Secondary malignant neoplasm of liver and intrahepatic bile duct: Secondary | ICD-10-CM

## 2020-10-24 DIAGNOSIS — Z5111 Encounter for antineoplastic chemotherapy: Secondary | ICD-10-CM | POA: Diagnosis not present

## 2020-10-24 LAB — CBC WITH DIFFERENTIAL (CANCER CENTER ONLY)
Abs Immature Granulocytes: 0.08 10*3/uL — ABNORMAL HIGH (ref 0.00–0.07)
Basophils Absolute: 0.1 10*3/uL (ref 0.0–0.1)
Basophils Relative: 1 %
Eosinophils Absolute: 0.3 10*3/uL (ref 0.0–0.5)
Eosinophils Relative: 4 %
HCT: 39.6 % (ref 39.0–52.0)
Hemoglobin: 13.3 g/dL (ref 13.0–17.0)
Immature Granulocytes: 1 %
Lymphocytes Relative: 29 %
Lymphs Abs: 2.1 10*3/uL (ref 0.7–4.0)
MCH: 32.9 pg (ref 26.0–34.0)
MCHC: 33.6 g/dL (ref 30.0–36.0)
MCV: 98 fL (ref 80.0–100.0)
Monocytes Absolute: 0.9 10*3/uL (ref 0.1–1.0)
Monocytes Relative: 12 %
Neutro Abs: 4 10*3/uL (ref 1.7–7.7)
Neutrophils Relative %: 53 %
Platelet Count: 160 10*3/uL (ref 150–400)
RBC: 4.04 MIL/uL — ABNORMAL LOW (ref 4.22–5.81)
RDW: 15.2 % (ref 11.5–15.5)
WBC Count: 7.4 10*3/uL (ref 4.0–10.5)
nRBC: 0 % (ref 0.0–0.2)

## 2020-10-24 LAB — CMP (CANCER CENTER ONLY)
ALT: 54 U/L — ABNORMAL HIGH (ref 0–44)
AST: 54 U/L — ABNORMAL HIGH (ref 15–41)
Albumin: 3.7 g/dL (ref 3.5–5.0)
Alkaline Phosphatase: 503 U/L — ABNORMAL HIGH (ref 38–126)
Anion gap: 7 (ref 5–15)
BUN: 14 mg/dL (ref 6–20)
CO2: 28 mmol/L (ref 22–32)
Calcium: 9.7 mg/dL (ref 8.9–10.3)
Chloride: 100 mmol/L (ref 98–111)
Creatinine: 0.58 mg/dL — ABNORMAL LOW (ref 0.61–1.24)
GFR, Estimated: >60 mL/min (ref >60)
Glucose, Bld: 228 mg/dL — ABNORMAL HIGH (ref 70–99)
Potassium: 4.1 mmol/L (ref 3.5–5.1)
Sodium: 135 mmol/L (ref 135–145)
Total Bilirubin: 1.3 mg/dL — ABNORMAL HIGH (ref 0.3–1.2)
Total Protein: 7.4 g/dL (ref 6.5–8.1)

## 2020-10-24 LAB — PREALBUMIN: Prealbumin: 12.7 mg/dL — ABNORMAL LOW (ref 18–38)

## 2020-10-24 LAB — LACTATE DEHYDROGENASE: LDH: 230 U/L — ABNORMAL HIGH (ref 98–192)

## 2020-10-24 MED ORDER — HEPARIN SOD (PORK) LOCK FLUSH 100 UNIT/ML IV SOLN
500.0000 [IU] | Freq: Once | INTRAVENOUS | Status: AC | PRN
  Administered 2020-10-24: 500 [IU]

## 2020-10-24 MED ORDER — GEMCITABINE HCL CHEMO INJECTION 1 GM/26.3ML
720.0000 mg/m2 | Freq: Once | INTRAVENOUS | Status: AC
  Administered 2020-10-24: 1178 mg via INTRAVENOUS
  Filled 2020-10-24: qty 26.3

## 2020-10-24 MED ORDER — SODIUM CHLORIDE 0.9% FLUSH
10.0000 mL | INTRAVENOUS | Status: DC | PRN
Start: 2020-10-24 — End: 2020-10-24
  Administered 2020-10-24: 10 mL

## 2020-10-24 MED ORDER — SODIUM CHLORIDE 0.9 % IV SOLN: Freq: Once | INTRAVENOUS | Status: AC

## 2020-10-24 MED ORDER — PACLITAXEL PROTEIN-BOUND CHEMO INJECTION 100 MG
101.2500 mg/m2 | Freq: Once | INTRAVENOUS | Status: AC
  Administered 2020-10-24: 175 mg via INTRAVENOUS
  Filled 2020-10-24: qty 35

## 2020-10-24 MED ORDER — PROCHLORPERAZINE MALEATE 10 MG PO TABS
10.0000 mg | ORAL_TABLET | Freq: Once | ORAL | Status: AC
  Administered 2020-10-24: 10 mg via ORAL
  Filled 2020-10-24: qty 1

## 2020-10-24 NOTE — Patient Instructions (Signed)
[facility]  Discharge Instructions: Thank you for choosing [facility] to provide your oncology and hematology care.   If you have a lab appointment with the [facility], please go directly to the [facility] and check in at the registration area.  Wear comfortable clothing and clothing appropriate for easy access to any Portacath or PICC line.   We strive to give you quality time with your provider. You may need to reschedule your appointment if you arrive late (15 or more minutes).  Arriving late affects you and other patients whose appointments are after yours.  Also, if you miss three or more appointments without notifying the office, you may be dismissed from the clinic at the provider's discretion.      For prescription refill requests, have your pharmacy contact our office and allow 72 hours for refills to be completed.    Today you received the following chemotherapy and/or immunotherapy agents Gemzar, Abraxane      To help prevent nausea and vomiting after your treatment, we encourage you to take your nausea medication as directed.  BELOW ARE SYMPTOMS THAT SHOULD BE REPORTED IMMEDIATELY: *FEVER GREATER THAN 100.4 F (38 C) OR HIGHER *CHILLS OR SWEATING *NAUSEA AND VOMITING THAT IS NOT CONTROLLED WITH YOUR NAUSEA MEDICATION *UNUSUAL SHORTNESS OF BREATH *UNUSUAL BRUISING OR BLEEDING *URINARY PROBLEMS (pain or burning when urinating, or frequent urination) *BOWEL PROBLEMS (unusual diarrhea, constipation, pain near the anus) TENDERNESS IN MOUTH AND THROAT WITH OR WITHOUT PRESENCE OF ULCERS (sore throat, sores in mouth, or a toothache) UNUSUAL RASH, SWELLING OR PAIN  UNUSUAL VAGINAL DISCHARGE OR ITCHING   Items with * indicate a potential emergency and should be followed up as soon as possible or go to the Emergency Department if any problems should occur.  Please show the CHEMOTHERAPY ALERT CARD or IMMUNOTHERAPY ALERT CARD at check-in  to the Emergency Department and triage nurse. Should you have questions after your visit or need to cancel or reschedule your appointment, please contact [facility]  [phone] and follow the prompts.  Office hours are 8:00 a.m. to 4:30 p.m. Monday - Friday. Please note that voicemails left after 4:00 p.m. may not be returned until the following business day.  We are closed weekends and major holidays. You have access to a nurse at all times for urgent questions. Please call the main number to the clinic [phone] and follow the prompts.  For any non-urgent questions, you may also contact your provider using MyChart. We now offer e-Visits for anyone 59 and older to request care online for non-urgent symptoms. For details visit [email or web address].   Also download the MyChart app! Go to the app store, search "MyChart", open the app, select [facility], and log in with your MyChart username and password.  Due to Covid, a mask is required upon entering the hospital/clinic. If you do not have a mask, one will be given to you upon arrival. For doctor visits, patients may have 1 support person aged 59 or older with them. For treatment visits, patients cannot have anyone with them due to current Covid guidelines and our immunocompromised population.

## 2020-10-24 NOTE — Patient Instructions (Signed)
Implanted Port Home Guide An implanted port is a device that is placed under the skin. It is usually placed in the chest. The device can be used to give IV medicine, to take blood, or for dialysis. You may have an implanted port if: You need IV medicine that would be irritating to the small veins in your hands or arms. You need IV medicines, such as antibiotics, for a long period of time. You need IV nutrition for a long period of time. You need dialysis. When you have a port, your health care provider can choose to use the port instead of veins in your arms for these procedures. You may have fewer limitations when using a port than you would if you used other types of long-term IVs, and you will likely be able to return to normal activities after your incision heals. An implanted port has two main parts: Reservoir. The reservoir is the part where a needle is inserted to give medicines or draw blood. The reservoir is round. After it is placed, it appears as a small, raised area under your skin. Catheter. The catheter is a thin, flexible tube that connects the reservoir to a vein. Medicine that is inserted into the reservoir goes into the catheter and then into the vein. How is my port accessed? To access your port: A numbing cream may be placed on the skin over the port site. Your health care provider will put on a mask and sterile gloves. The skin over your port will be cleaned carefully with a germ-killing soap and allowed to dry. Your health care provider will gently pinch the port and insert a needle into it. Your health care provider will check for a blood return to make sure the port is in the vein and is not clogged. If your port needs to remain accessed to get medicine continuously (constant infusion), your health care provider will place a clear bandage (dressing) over the needle site. The dressing and needle will need to be changed every week, or as told by your health care provider. What  is flushing? Flushing helps keep the port from getting clogged. Follow instructions from your health care provider about how and when to flush the port. Ports are usually flushed with saline solution or a medicine called heparin. The need for flushing will depend on how the port is used: If the port is only used from time to time to give medicines or draw blood, the port may need to be flushed: Before and after medicines have been given. Before and after blood has been drawn. As part of routine maintenance. Flushing may be recommended every 4-6 weeks. If a constant infusion is running, the port may not need to be flushed. Throw away any syringes in a disposal container that is meant for sharp items (sharps container). You can buy a sharps container from a pharmacy, or you can make one by using an empty hard plastic bottle with a cover. How long will my port stay implanted? The port can stay in for as long as your health care provider thinks it is needed. When it is time for the port to come out, a surgery will be done to remove it. The surgery will be similar to the procedure that was done to put the port in. Follow these instructions at home:  Flush your port as told by your health care provider. If you need an infusion over several days, follow instructions from your health care provider about how   to take care of your port site. Make sure you: Wash your hands with soap and water before you change your dressing. If soap and water are not available, use alcohol-based hand sanitizer. Change your dressing as told by your health care provider. Place any used dressings or infusion bags into a plastic bag. Throw that bag in the trash. Keep the dressing that covers the needle clean and dry. Do not get it wet. Do not use scissors or sharp objects near the tube. Keep the tube clamped, unless it is being used. Check your port site every day for signs of infection. Check for: Redness, swelling, or  pain. Fluid or blood. Pus or a bad smell. Protect the skin around the port site. Avoid wearing bra straps that rub or irritate the site. Protect the skin around your port from seat belts. Place a soft pad over your chest if needed. Bathe or shower as told by your health care provider. The site may get wet as long as you are not actively receiving an infusion. Return to your normal activities as told by your health care provider. Ask your health care provider what activities are safe for you. Carry a medical alert card or wear a medical alert bracelet at all times. This will let health care providers know that you have an implanted port in case of an emergency. Get help right away if: You have redness, swelling, or pain at the port site. You have fluid or blood coming from your port site. You have pus or a bad smell coming from the port site. You have a fever. Summary Implanted ports are usually placed in the chest for long-term IV access. Follow instructions from your health care provider about flushing the port and changing bandages (dressings). Take care of the area around your port by avoiding clothing that puts pressure on the area, and by watching for signs of infection. Protect the skin around your port from seat belts. Place a soft pad over your chest if needed. Get help right away if you have a fever or you have redness, swelling, pain, drainage, or a bad smell at the port site. This information is not intended to replace advice given to you by your health care provider. Make sure you discuss any questions you have with your health care provider. Document Revised: 03/13/2020 Document Reviewed: 05/08/2019 Elsevier Patient Education  2022 [employer] Inc.  

## 2020-10-24 NOTE — Progress Notes (Signed)
Ok to treat with ALT and AST greater than 50 per Dr Marin Olp. dph

## 2020-10-25 ENCOUNTER — Telehealth: Payer: Self-pay | Admitting: Hematology & Oncology

## 2020-10-25 LAB — IRON AND TIBC
Iron: 88 ug/dL (ref 45–182)
Saturation Ratios: 28 % (ref 17.9–39.5)
TIBC: 310 ug/dL (ref 250–450)
UIBC: 222 ug/dL

## 2020-10-25 LAB — CANCER ANTIGEN 19-9: CA 19-9: 5634 U/mL — ABNORMAL HIGH (ref 0–35)

## 2020-10-25 LAB — FERRITIN: Ferritin: 255 ng/mL (ref 24–336)

## 2020-10-25 NOTE — Telephone Encounter (Signed)
Scheduled appt per 10/20 sch msg - patient is aware of appt date and time

## 2020-10-31 ENCOUNTER — Inpatient Hospital Stay: Payer: Medicare Other

## 2020-10-31 ENCOUNTER — Other Ambulatory Visit: Payer: Self-pay

## 2020-10-31 ENCOUNTER — Other Ambulatory Visit: Payer: Self-pay | Admitting: Family

## 2020-10-31 DIAGNOSIS — C259 Malignant neoplasm of pancreas, unspecified: Secondary | ICD-10-CM

## 2020-10-31 DIAGNOSIS — Z5111 Encounter for antineoplastic chemotherapy: Secondary | ICD-10-CM | POA: Diagnosis not present

## 2020-10-31 DIAGNOSIS — C787 Secondary malignant neoplasm of liver and intrahepatic bile duct: Secondary | ICD-10-CM

## 2020-10-31 LAB — CBC WITH DIFFERENTIAL (CANCER CENTER ONLY)
Abs Immature Granulocytes: 0.03 10*3/uL (ref 0.00–0.07)
Basophils Absolute: 0.1 10*3/uL (ref 0.0–0.1)
Basophils Relative: 2 %
Eosinophils Absolute: 0.1 10*3/uL (ref 0.0–0.5)
Eosinophils Relative: 2 %
HCT: 36 % — ABNORMAL LOW (ref 39.0–52.0)
Hemoglobin: 12.1 g/dL — ABNORMAL LOW (ref 13.0–17.0)
Immature Granulocytes: 1 %
Lymphocytes Relative: 41 %
Lymphs Abs: 1.4 10*3/uL (ref 0.7–4.0)
MCH: 32.8 pg (ref 26.0–34.0)
MCHC: 33.6 g/dL (ref 30.0–36.0)
MCV: 97.6 fL (ref 80.0–100.0)
Monocytes Absolute: 0.3 10*3/uL (ref 0.1–1.0)
Monocytes Relative: 8 %
Neutro Abs: 1.6 10*3/uL — ABNORMAL LOW (ref 1.7–7.7)
Neutrophils Relative %: 46 %
Platelet Count: 54 10*3/uL — ABNORMAL LOW (ref 150–400)
RBC: 3.69 MIL/uL — ABNORMAL LOW (ref 4.22–5.81)
RDW: 15 % (ref 11.5–15.5)
WBC Count: 3.4 10*3/uL — ABNORMAL LOW (ref 4.0–10.5)
nRBC: 0 % (ref 0.0–0.2)

## 2020-10-31 LAB — CMP (CANCER CENTER ONLY)
ALT: 78 U/L — ABNORMAL HIGH (ref 0–44)
AST: 77 U/L — ABNORMAL HIGH (ref 15–41)
Albumin: 3.7 g/dL (ref 3.5–5.0)
Alkaline Phosphatase: 569 U/L — ABNORMAL HIGH (ref 38–126)
Anion gap: 7 (ref 5–15)
BUN: 14 mg/dL (ref 6–20)
CO2: 27 mmol/L (ref 22–32)
Calcium: 9.9 mg/dL (ref 8.9–10.3)
Chloride: 100 mmol/L (ref 98–111)
Creatinine: 0.71 mg/dL (ref 0.61–1.24)
GFR, Estimated: >60 mL/min (ref >60)
Glucose, Bld: 336 mg/dL — ABNORMAL HIGH (ref 70–99)
Potassium: 4.2 mmol/L (ref 3.5–5.1)
Sodium: 134 mmol/L — ABNORMAL LOW (ref 135–145)
Total Bilirubin: 1.7 mg/dL — ABNORMAL HIGH (ref 0.3–1.2)
Total Protein: 7.6 g/dL (ref 6.5–8.1)

## 2020-10-31 MED ORDER — SODIUM CHLORIDE 0.9 % IV SOLN: 720.0000 mg/m2 | Freq: Once | INTRAVENOUS | Status: DC

## 2020-10-31 MED ORDER — SODIUM CHLORIDE 0.9% FLUSH
10.0000 mL | INTRAVENOUS | Status: DC | PRN
  Administered 2020-10-31: 10 mL

## 2020-10-31 MED ORDER — HEPARIN SOD (PORK) LOCK FLUSH 100 UNIT/ML IV SOLN
500.0000 [IU] | Freq: Once | INTRAVENOUS | Status: AC | PRN
  Administered 2020-10-31: 500 [IU]

## 2020-10-31 MED ORDER — PROCHLORPERAZINE MALEATE 10 MG PO TABS: 10.0000 mg | ORAL_TABLET | Freq: Once | ORAL | Status: DC

## 2020-10-31 MED ORDER — PACLITAXEL PROTEIN-BOUND CHEMO INJECTION 100 MG: 101.2500 mg/m2 | Freq: Once | INTRAVENOUS | Status: DC

## 2020-10-31 MED ORDER — SODIUM CHLORIDE 0.9 % IV SOLN: Freq: Once | INTRAVENOUS | Status: DC

## 2020-10-31 NOTE — Patient Instructions (Signed)

## 2020-10-31 NOTE — Progress Notes (Signed)
Per Lottie Dawson NP, treatment held today, d/t labs, patient to return on 11/21/2020 for day 15 of treatment, refused IVF's, states he has drinks at home with electrolytes, has pain medication at home as well as anti nausea medications if he needs them. Given updated calendar with appt date and time.

## 2020-11-07 ENCOUNTER — Other Ambulatory Visit: Payer: Self-pay | Admitting: Hematology & Oncology

## 2020-11-07 MED ORDER — FENTANYL 50 MCG/HR TD PT72: 1.0000 | MEDICATED_PATCH | TRANSDERMAL | 0 refills | Status: DC

## 2020-11-08 ENCOUNTER — Other Ambulatory Visit (HOSPITAL_BASED_OUTPATIENT_CLINIC_OR_DEPARTMENT_OTHER): Payer: Self-pay | Admitting: Cardiology

## 2020-11-08 NOTE — Telephone Encounter (Signed)
Called the patient to schedule an appointment to see Dr. Harrell Gave. Will fill enough prescription to get him until appointment.

## 2020-11-09 ENCOUNTER — Other Ambulatory Visit: Payer: Self-pay | Admitting: Family Medicine

## 2020-11-10 ENCOUNTER — Encounter: Payer: Self-pay | Admitting: Family Medicine

## 2020-11-11 ENCOUNTER — Other Ambulatory Visit: Payer: Self-pay | Admitting: Family Medicine

## 2020-11-11 MED ORDER — OSELTAMIVIR PHOSPHATE 75 MG PO CAPS: 75.0000 mg | ORAL_CAPSULE | Freq: Two times a day (BID) | ORAL | 0 refills | Status: DC

## 2020-11-12 ENCOUNTER — Other Ambulatory Visit: Payer: Self-pay | Admitting: Hematology & Oncology

## 2020-11-20 ENCOUNTER — Other Ambulatory Visit: Payer: Self-pay | Admitting: Hematology & Oncology

## 2020-11-20 MED ORDER — DRONABINOL 5 MG PO CAPS: 5.0000 mg | ORAL_CAPSULE | Freq: Two times a day (BID) | ORAL | 0 refills | Status: DC

## 2020-11-21 ENCOUNTER — Inpatient Hospital Stay: Payer: Medicare Other

## 2020-11-21 ENCOUNTER — Inpatient Hospital Stay (HOSPITAL_BASED_OUTPATIENT_CLINIC_OR_DEPARTMENT_OTHER): Payer: Medicare Other | Admitting: Hematology & Oncology

## 2020-11-21 ENCOUNTER — Encounter: Payer: Self-pay | Admitting: Hematology & Oncology

## 2020-11-21 ENCOUNTER — Encounter: Payer: Self-pay | Admitting: *Deleted

## 2020-11-21 ENCOUNTER — Other Ambulatory Visit: Payer: Self-pay

## 2020-11-21 ENCOUNTER — Inpatient Hospital Stay: Payer: Medicare Other | Attending: Hematology & Oncology

## 2020-11-21 VITALS — BP 99/54 | HR 66 | Temp 98.4°F | Resp 18 | Wt 128.1 lb

## 2020-11-21 DIAGNOSIS — C787 Secondary malignant neoplasm of liver and intrahepatic bile duct: Secondary | ICD-10-CM

## 2020-11-21 DIAGNOSIS — R1110 Vomiting, unspecified: Secondary | ICD-10-CM | POA: Diagnosis not present

## 2020-11-21 DIAGNOSIS — E119 Type 2 diabetes mellitus without complications: Secondary | ICD-10-CM | POA: Diagnosis not present

## 2020-11-21 DIAGNOSIS — C259 Malignant neoplasm of pancreas, unspecified: Secondary | ICD-10-CM

## 2020-11-21 LAB — CMP (CANCER CENTER ONLY)
ALT: 42 U/L (ref 0–44)
AST: 58 U/L — ABNORMAL HIGH (ref 15–41)
Albumin: 3.6 g/dL (ref 3.5–5.0)
Alkaline Phosphatase: 596 U/L — ABNORMAL HIGH (ref 38–126)
Anion gap: 5 (ref 5–15)
BUN: 22 mg/dL — ABNORMAL HIGH (ref 6–20)
CO2: 28 mmol/L (ref 22–32)
Calcium: 9.6 mg/dL (ref 8.9–10.3)
Chloride: 98 mmol/L (ref 98–111)
Creatinine: 0.63 mg/dL (ref 0.61–1.24)
GFR, Estimated: >60 mL/min (ref >60)
Glucose, Bld: 259 mg/dL — ABNORMAL HIGH (ref 70–99)
Potassium: 4.7 mmol/L (ref 3.5–5.1)
Sodium: 131 mmol/L — ABNORMAL LOW (ref 135–145)
Total Bilirubin: 1 mg/dL (ref 0.3–1.2)
Total Protein: 8 g/dL (ref 6.5–8.1)

## 2020-11-21 LAB — CBC WITH DIFFERENTIAL (CANCER CENTER ONLY)
Abs Immature Granulocytes: 0.05 10*3/uL (ref 0.00–0.07)
Basophils Absolute: 0.1 10*3/uL (ref 0.0–0.1)
Basophils Relative: 1 %
Eosinophils Absolute: 0.3 10*3/uL (ref 0.0–0.5)
Eosinophils Relative: 4 %
HCT: 38.7 % — ABNORMAL LOW (ref 39.0–52.0)
Hemoglobin: 12.9 g/dL — ABNORMAL LOW (ref 13.0–17.0)
Immature Granulocytes: 1 %
Lymphocytes Relative: 24 %
Lymphs Abs: 2.1 10*3/uL (ref 0.7–4.0)
MCH: 32.2 pg (ref 26.0–34.0)
MCHC: 33.3 g/dL (ref 30.0–36.0)
MCV: 96.5 fL (ref 80.0–100.0)
Monocytes Absolute: 0.7 10*3/uL (ref 0.1–1.0)
Monocytes Relative: 9 %
Neutro Abs: 5.3 10*3/uL (ref 1.7–7.7)
Neutrophils Relative %: 61 %
Platelet Count: 151 10*3/uL (ref 150–400)
RBC: 4.01 MIL/uL — ABNORMAL LOW (ref 4.22–5.81)
RDW: 14.5 % (ref 11.5–15.5)
Smear Review: NORMAL
WBC Count: 8.5 10*3/uL (ref 4.0–10.5)
nRBC: 0 % (ref 0.0–0.2)

## 2020-11-21 MED ORDER — METOCLOPRAMIDE HCL 10 MG PO TABS: 10.0000 mg | ORAL_TABLET | Freq: Three times a day (TID) | ORAL | 3 refills | Status: DC

## 2020-11-21 MED ORDER — SODIUM CHLORIDE 0.9% FLUSH
10.0000 mL | Freq: Once | INTRAVENOUS | Status: AC
  Administered 2020-11-21: 11:00:00 10 mL via INTRAVENOUS

## 2020-11-21 MED ORDER — METOCLOPRAMIDE HCL 5 MG/ML IJ SOLN
20.0000 mg | Freq: Once | INTRAVENOUS | Status: AC
  Administered 2020-11-21: 12:00:00 20 mg via INTRAVENOUS
  Filled 2020-11-21: qty 4

## 2020-11-21 MED ORDER — OLANZAPINE 10 MG PO TABS: 10.0000 mg | ORAL_TABLET | Freq: Every day | ORAL | 3 refills | Status: DC

## 2020-11-21 MED ORDER — SODIUM CHLORIDE 0.9 % IV SOLN
40.0000 mg | Freq: Once | INTRAVENOUS | Status: AC
  Administered 2020-11-21: 12:00:00 40 mg via INTRAVENOUS
  Filled 2020-11-21: qty 4

## 2020-11-21 MED ORDER — SODIUM CHLORIDE 0.9 % IV SOLN: INTRAVENOUS | Status: AC

## 2020-11-21 NOTE — Patient Instructions (Signed)

## 2020-11-21 NOTE — Progress Notes (Signed)
Hematology and Oncology Follow Up Visit  Dean Singh [id number] 1960-12-08 60 y.o. 11/21/2020   Principle Diagnosis:  Metastatic adenocarcinoma the pancreas-hepatic metastasis  Current Therapy:   FOLFIRINOX-s/p cycle #2 - start on 05/20/2020 --d/c on 07/16/2020 due to toxicity Abraxane/Gemzar -- s/p cycle #3-- start on 07/25/2020 --we will drop day #15 of treatment starting with cycle 3.     Interim History:  Dean Singh is back for follow-up.  Unfortunately, he is still having problems with vomiting.  His quality of life is just not that great.  We does not come be able to treat him right now.  I really do not want to see him get sick before Thanksgiving.  I really want him to be able to enjoy Thanksgiving with his family.  He is not having diarrhea.  As such, I will try him on some Reglan.  We will see if Reglan will help with the vomiting.  I will also try him on some Zyprexa at bedtime.  Maybe Zyprexa at bedtime will help.  He is on a ton of medications.  It is possible I thought all the medicines that he is on could be contributing to the nausea and vomiting.  His blood sugars are still on the high side.  He has diabetes.  I really do not care all that much if his blood sugars are this high.  His CA 19-9 keeps coming down.  We last checked it back in October, it was 5634.  Again, I just wish his quality of life was better.  I know he is still smoking but I really do not think this is that much of an issue for the most part.  He is still having some pain problems.  He is on a pretty good pain regimen.  He is not bleeding.  He has had no fever.  He has had no mouth sores.  Overall, his performance status is ECOG 2-3.    Medications:  Current Outpatient Medications:    aspirin EC 81 MG tablet, Take 1 tablet (81 mg total) by mouth daily., Disp:  , Rfl:    cilostazol (PLETAL) 100 MG tablet, TAKE 1 TABLET BY MOUTH TWICE A DAY, Disp: 180 tablet, Rfl: 1   clopidogrel (PLAVIX) 75  MG tablet, Take 1 tablet (75 mg total) by mouth daily. Pt. Appointment scheduled for 11/25/2020 @ 0840. Will only fill enough to get patient to this appointment., Disp: 17 tablet, Rfl: 0   Continuous Blood Gluc Receiver ([place]) DEVI, Use as directed E11.65, Disp: 1 each, Rfl: 0   Continuous Blood Gluc Sensor (DEXCOM G6 SENSOR) MISC, 1 Device by Does not apply route as directed., Disp: 9 each, Rfl: 3   Continuous Blood Gluc Transmit (DEXCOM G6 TRANSMITTER) MISC, 1 Device by Does not apply route as directed., Disp: 1 each, Rfl: 3   dapagliflozin propanediol (FARXIGA) 5 MG TABS tablet, Take 1 tablet (5 mg total) by mouth daily., Disp: 90 tablet, Rfl: 1   diphenoxylate-atropine (LOMOTIL) 2.5-0.025 MG tablet, TAKE 2 TABLETS BY MOUTH 4 (FOUR) TIMES DAILY AS NEEDED FOR DIARRHEA OR LOOSE STOOLS., Disp: 100 tablet, Rfl: 0   dronabinol (MARINOL) 5 MG capsule, Take 1 capsule (5 mg total) by mouth 2 (two) times daily before lunch and supper., Disp: 60 capsule, Rfl: 0   fentaNYL (DURAGESIC) 50 MCG/HR, Place 1 patch onto the skin every 3 (three) days., Disp: 10 patch, Rfl: 0   glucose blood (ONETOUCH ULTRA) test strip, USE TWICE DAILY  AS DIRECTED ( E11.65), Disp: 200 each, Rfl: 12   hyoscyamine (LEVSIN SL) 0.125 MG SL tablet, Place 1 tablet (0.125 mg total) under the tongue every 4 (four) hours as needed., Disp: 20 tablet, Rfl: 1   insulin aspart (NOVOLOG FLEXPEN) 100 UNIT/ML FlexPen, Inject 6 Units into the skin 3 (three) times daily with meals., Disp: 15 mL, Rfl: 6   Insulin Glargine (BASAGLAR KWIKPEN) 100 UNIT/ML, Inject 20 Units into the skin daily. (Patient taking differently: Inject 22 Units into the skin daily.), Disp: 30 mL, Rfl: 4   Insulin Pen Needle 32G X 4 MM MISC, 1 Device by Does not apply route in the morning, at noon, in the evening, and at bedtime., Disp: 400 each, Rfl: 3   lidocaine-prilocaine (EMLA) cream, APPLY TO AFFECTED AREA ONCE AS DIRECTED, Disp: 30 g, Rfl: 3    lipase/protease/amylase (CREON) 36000 UNITS CPEP capsule, Take 2 capsules (72,000 Units total) by mouth 3 (three) times daily with meals. May also take 1 capsule (36,000 Units total) as needed (with snacks)., Disp: 240 capsule, Rfl: 11   loperamide (IMODIUM) 2 MG capsule, TAKE 2 AT ONSET OF DIARRHEA, THEN 1 EVERY 2 HOURS UNTIL 12 HOURS WITHOUT A BOWEL MOEVENT. MAY TAKE 2 TAB EVERY 4 HOURS AT BEDTIME. IF DIARRHEA RECURS REPEAT., Disp: 100 capsule, Rfl: 1   LORazepam (ATIVAN) 0.5 MG tablet, Take 1 tablet (0.5 mg total) by mouth every 6 (six) hours as needed (Nausea or vomiting)., Disp: 30 tablet, Rfl: 0   metoCLOPramide (REGLAN) 10 MG tablet, Take 1 tablet (10 mg total) by mouth 4 (four) times daily -  before meals and at bedtime., Disp: 120 tablet, Rfl: 3   mupirocin ointment (BACTROBAN) 2 %, Place 1 application into the nose at bedtime., Disp: 22 g, Rfl: 1   nitroGLYCERIN (NITROSTAT) 0.4 MG SL tablet, Place 0.4 mg under the tongue every 5 (five) minutes as needed for chest pain., Disp: , Rfl:    OLANZapine (ZYPREXA) 10 MG tablet, Take 1 tablet (10 mg total) by mouth at bedtime., Disp: 30 tablet, Rfl: 3   ondansetron (ZOFRAN) 4 MG tablet, Take 1 tablet (4 mg total) by mouth every 8 (eight) hours as needed for nausea or vomiting., Disp: 40 tablet, Rfl: 0   ondansetron (ZOFRAN) 8 MG tablet, TAKE 1 TABLET (8 MG TOTAL) BY MOUTH 2 (TWO) TIMES DAILY AS NEEDED (NAUSEA OR VOMITING)., Disp: 30 tablet, Rfl: 1   oseltamivir (TAMIFLU) 75 MG capsule, Take 1 capsule (75 mg total) by mouth 2 (two) times daily., Disp: 10 capsule, Rfl: 0   pantoprazole (PROTONIX) 40 MG tablet, Take 1 tablet (40 mg total) by mouth daily., Disp: 90 tablet, Rfl: 3   pregabalin (LYRICA) 300 MG capsule, TAKE 1 CAPSULE BY MOUTH EVERYDAY AT BEDTIME, Disp: 30 capsule, Rfl: 5   prochlorperazine (COMPAZINE) 10 MG tablet, Take 1 tablet (10 mg total) by mouth every 6 (six) hours as needed (Nausea or vomiting)., Disp: 30 tablet, Rfl: 1   Saw  Palmetto, Serenoa repens, (SAW PALMETTO PO), Take 1 capsule by mouth daily as needed (When having prostate problems)., Disp: , Rfl:    sucralfate (CARAFATE) 1 GM/10ML suspension, TAKE 10 MLS (1 G TOTAL) BY MOUTH 4 (FOUR) TIMES DAILY - WITH MEALS AND AT BEDTIME., Disp: 420 mL, Rfl: 1   SUMAtriptan (IMITREX) 25 MG tablet, TAKE 1 TABLET BY MOUTH EVERY 2 HOURS AS NEEDED FOR MIGRAINE., Disp: 12 tablet, Rfl: 2   Wheat Dextrin (BENEFIBER PO), Take 1 Scoop by mouth  daily., Disp: , Rfl:   Current Facility-Administered Medications:    0.9 %  sodium chloride infusion, , Intravenous, Continuous, Eino Whitner, Rudell Cobb, MD   famotidine (PEPCID) 40 mg in sodium chloride 0.9 % 100 mL IVPB, 40 mg, Intravenous, Once, Mikayla Chiusano, Rudell Cobb, MD   metoCLOPramide (REGLAN) 20 mg in dextrose 5 % 50 mL IVPB, 20 mg, Intravenous, Once, Anastasia Tompson, Rudell Cobb, MD  Allergies:  Allergies  Allergen Reactions   Statins Itching    Atorvastatin cause "my skin to feel horrible and unbearable".   Morphine Nausea And Vomiting    "felt like chest was exploding" pt reports     Past Medical History, Surgical history, Social history, and Family History were reviewed and updated.  Review of Systems: Review of Systems  Constitutional:  Positive for fatigue.  HENT:  Negative.    Eyes: Negative.   Respiratory: Negative.    Cardiovascular: Negative.   Gastrointestinal:  Positive for abdominal pain.  Endocrine: Negative.   Musculoskeletal:  Positive for arthralgias, flank pain and myalgias.  Skin: Negative.   Neurological: Negative.   Hematological: Negative.   Psychiatric/Behavioral: Negative.     Physical Exam:  weight is 128 lb 1.9 oz (58.1 kg). His oral temperature is 98.4 F (36.9 C). His blood pressure is 99/54 (abnormal) and his pulse is 66. His respiration is 18 and oxygen saturation is 97%.   Wt Readings from Last 3 Encounters:  11/21/20 128 lb 1.9 oz (58.1 kg)  10/18/20 129 lb (58.5 kg)  10/17/20 130 lb (59 kg)     Physical Exam Vitals reviewed.  HENT:     Head: Normocephalic and atraumatic.  Eyes:     Pupils: Pupils are equal, round, and reactive to light.  Cardiovascular:     Rate and Rhythm: Normal rate and regular rhythm.     Heart sounds: Normal heart sounds.  Pulmonary:     Effort: Pulmonary effort is normal.     Breath sounds: Normal breath sounds.  Abdominal:     General: Bowel sounds are normal.     Palpations: Abdomen is soft.  Musculoskeletal:        General: No tenderness or deformity. Normal range of motion.     Cervical back: Normal range of motion.  Lymphadenopathy:     Cervical: No cervical adenopathy.  Skin:    General: Skin is warm and dry.     Findings: No erythema or rash.  Neurological:     Mental Status: He is alert and oriented to person, place, and time.  Psychiatric:        Behavior: Behavior normal.        Thought Content: Thought content normal.        Judgment: Judgment normal.     Lab Results  Component Value Date   WBC 8.5 11/21/2020   HGB 12.9 (L) 11/21/2020   HCT 38.7 (L) 11/21/2020   MCV 96.5 11/21/2020   PLT 151 11/21/2020     Chemistry      Component Value Date/Time   NA 131 (L) 11/21/2020 1001   NA 132 (L) 05/01/2019 1108   K 4.7 11/21/2020 1001   CL 98 11/21/2020 1001   CO2 28 11/21/2020 1001   BUN 22 (H) 11/21/2020 1001   BUN 18 05/01/2019 1108   CREATININE 0.63 11/21/2020 1001      Component Value Date/Time   CALCIUM 9.6 11/21/2020 1001   ALKPHOS 596 (H) 11/21/2020 1001   AST 58 (H) 11/21/2020 1001  ALT 42 11/21/2020 1001   BILITOT 1.0 11/21/2020 1001      Impression and Plan: Mr. Ruhe is a very nice 60 year old white male.  He has metastatic adenocarcinoma the pancreas.  He has extensive liver metastasis.  I really hate the fact that he is still having problems.  This is all about quality of life.  I just wish that his quality of life would be a little bit better.  With Thanksgiving next week, I want him to feel  better for Thanksgiving.  We just are not, we will treat him now.  Hopefully, IV fluids and IV Reglan will help him feel a little bit better.  We will plan to see him back the week after Thanksgiving.  We will see if he is feeling better and maybe then we can initiate his fourth cycle of therapy     Volanda Napoleon, MD 11/17/202211:37 AM

## 2020-11-21 NOTE — Progress Notes (Signed)
Received notification from Genetics that patient was a genetic candidate based on diagnosis. Spoke to patient and his wife regarding this and gave them the genetic department brochure. Patient has opted to proceed with testing. Blood drawn while here and will be mailed this afternoon.  Roma Kayser in genetics notified and she will place orders. Referral order placed.   Oncology Nurse Navigator Documentation  Oncology Nurse Navigator Flowsheets 11/21/2020  Abnormal Finding Date -  Confirmed Diagnosis Date -  Diagnosis Status -  Planned Course of Treatment -  Phase of Treatment -  Chemotherapy Actual Start Date: -  Navigator Follow Up Date: 12/04/2020  Navigator Follow Up Reason: Follow-up Appointment  Navigator Location [facility]  Referral Date to RadOnc/MedOnc -  Navigator Encounter Type Follow-up Appt  Telephone -  Treatment Initiated Date -  Patient Visit Type MedOnc  Treatment Phase Active Tx  Barriers/Navigation Needs Coordination of Care;Education  Education Other  Interventions Coordination of Care;Education;Psycho-Social Support  Acuity Level 2-Minimal Needs (1-2 Barriers Identified)  Coordination of Care Other  Education Method Verbal  Support Groups/Services Friends and Family  Time Spent with Patient 3

## 2020-11-21 NOTE — Patient Instructions (Signed)

## 2020-11-22 ENCOUNTER — Encounter: Payer: Self-pay | Admitting: Hematology & Oncology

## 2020-11-22 ENCOUNTER — Telehealth: Payer: Self-pay | Admitting: Genetic Counselor

## 2020-11-22 LAB — CANCER ANTIGEN 19-9: CA 19-9: 4121 U/mL — ABNORMAL HIGH (ref 0–35)

## 2020-11-22 NOTE — Telephone Encounter (Signed)
Scheduled per sch msg. Called and spoke with patient. Confirmed appt  

## 2020-11-25 ENCOUNTER — Telehealth: Payer: Self-pay | Admitting: Cardiology

## 2020-11-25 NOTE — Telephone Encounter (Signed)
Per Dr. Harrell Gave, ok to change to virtual visit.

## 2020-11-25 NOTE — Telephone Encounter (Signed)
Pt is wanting to change tomorrows appt to virtual. States that this is what he initially requested. Are we able to do this for the pt? Please advise.

## 2020-11-26 ENCOUNTER — Telehealth (INDEPENDENT_AMBULATORY_CARE_PROVIDER_SITE_OTHER): Payer: Medicare Other | Admitting: Cardiology

## 2020-11-26 ENCOUNTER — Encounter (HOSPITAL_BASED_OUTPATIENT_CLINIC_OR_DEPARTMENT_OTHER): Payer: Self-pay | Admitting: Cardiology

## 2020-11-26 VITALS — BP 140/83 | HR 65 | Ht 63.0 in | Wt 124.2 lb

## 2020-11-26 DIAGNOSIS — C259 Malignant neoplasm of pancreas, unspecified: Secondary | ICD-10-CM | POA: Diagnosis not present

## 2020-11-26 DIAGNOSIS — I25118 Atherosclerotic heart disease of native coronary artery with other forms of angina pectoris: Secondary | ICD-10-CM | POA: Diagnosis not present

## 2020-11-26 DIAGNOSIS — C787 Secondary malignant neoplasm of liver and intrahepatic bile duct: Secondary | ICD-10-CM

## 2020-11-26 DIAGNOSIS — Z95820 Peripheral vascular angioplasty status with implants and grafts: Secondary | ICD-10-CM | POA: Diagnosis not present

## 2020-11-26 DIAGNOSIS — I739 Peripheral vascular disease, unspecified: Secondary | ICD-10-CM

## 2020-11-26 MED ORDER — CLOPIDOGREL BISULFATE 75 MG PO TABS: 75.0000 mg | ORAL_TABLET | Freq: Every day | ORAL | 3 refills | Status: AC

## 2020-11-26 NOTE — Progress Notes (Signed)
Virtual Visit via Video Note   This visit type was conducted due to national recommendations for restrictions regarding the COVID-19 Pandemic (e.g. social distancing) in an effort to limit this patient's exposure and mitigate transmission in our community.  Due to his co-morbid illnesses, this patient is at least at moderate risk for complications without adequate follow up.  This format is felt to be most appropriate for this patient at this time.  All issues noted in this document were discussed and addressed.  A limited physical exam was performed with this format.  Please refer to the patient's chart for his consent to telehealth for [facility].      The patient was identified using 2 identifiers.  Date:  11/26/2020   ID:  Jacqulynn Cadet L. Antar, Milks 11/08/1960, MRN [id number]  Patient Location: Home Provider Location: Office/Clinic  PCP:  Mosie Lukes, MD  Cardiologist:  Buford Dresser, MD  Electrophysiologist:  None   Evaluation Performed:  Follow-Up Visit  Chief Complaint:  Follow-up  History of Present Illness:    The patient does not have symptoms concerning for COVID-19 infection (fever, chills, cough, or new shortness of breath).   Krishav L. Oblinger is a 60 y.o. male with a hx of CAD s/p multiple stents, CVA, tobacco abuse, hyperlipidemia, uncontrolled type II diabetes. Pertinent non-CV medical history includes current metastatic pancreatic cancer, bladder cancer, severe peripheral neuropathy, DDD, GERD. My initial visit with him was on 08/23/18.  Cath 09/02/18 showed: 1.  Left dominant coronary system with continued patency of the stented segment in the proximal/mid LAD and severe de novo stenosis in the mid and distal circumflex 2.  Successful PCI of the mid and distal circumflex using a 2.5 x 30 mm resolute Onyx DES in the distal vessel and a 3.0 x 26 mm resolute Onyx DES in the mid vessel 3.  Normal LV systolic function with normal LVEDP.   Plan: same day PCI,  Pt loaded with clopidogrel on the table. Should remain on DAPT with ASA and clopidogrel at least 6 months (stable CAD recommendation).  Today: Since our last visit, he has been diagnosed with metastatic pancreatic cancer. We discussed this. He can eat/drink but has struggled with emesis. Has lost significant weight. Current regimen is helping with emesis and pain.   He has rare chest pain, but brief/fleeting. Has not required nitro.  We reviewed his medications. When he receives chemo, his platelets dip briefly, but he has not has significant bleeding. Discussed that if needed, we can drop to either aspirin or clopidogrel rather than both. He feels he is doing well from a cardiac standpoint and would like to continue DAPT for now. Refilled clopidogrel today. Counseled on watching for bleeding.  Denies shortness of breath at rest or with normal exertion. No PND, orthopnea, LE edema. No syncope or palpitations. Feels generally fatigued.   Past Medical History:  Diagnosis Date   Arthritis    DDD   Bladder cancer ([place])    Bladder cancer ([place]) 08/17/2018   CAD in native artery 08/17/2018   Current nicotine use 08/17/2018   Diabetes mellitus without complication (HCC)    GERD (gastroesophageal reflux disease)    Goals of care, counseling/discussion 04/24/2020   H/O: stroke    Heart attack ([place])    2 stents   Hx of hemorrhoids 08/17/2018   Hyperlipidemia    Hypertension    Pancreatic cancer metastasized to liver ([place]) 04/24/2020   Peripheral neuropathy 08/17/2018   Stroke ([place]) 60  Visual changes 08/17/2018    Past Surgical History:  Procedure Laterality Date   ABDOMINAL AORTOGRAM W/LOWER EXTREMITY Bilateral 02/08/2020   Procedure: ABDOMINAL AORTOGRAM W/LOWER EXTREMITY;  Surgeon: Marty Heck, MD;  Location: [place] [facility];  Service: Cardiovascular;  Laterality: Bilateral;   BLADDER TUMOR EXCISION     cancer   CAROTID ENDARTERECTOMY Left    CORONARY ANGIOPLASTY WITH STENT  PLACEMENT     2 stent   CORONARY STENT INTERVENTION N/A 09/02/2018   Procedure: CORONARY STENT INTERVENTION;  Surgeon: Sherren Mocha, MD;  Location: [place] [facility];  Service: Cardiovascular;  Laterality: N/A;   ESOPHAGOGASTRODUODENOSCOPY (EGD) WITH PROPOFOL N/A 05/02/2020   Procedure: ESOPHAGOGASTRODUODENOSCOPY (EGD) WITH PROPOFOL;  Surgeon: [name], MD;  Location: [facility];  Service: Endoscopy;  Laterality: N/A;   FINE NEEDLE ASPIRATION N/A 05/02/2020   Procedure: FINE NEEDLE ASPIRATION (FNA) LINEAR;  Surgeon: [name], MD;  Location: [facility];  Service: Endoscopy;  Laterality: N/A;   HERNIA REPAIR     right inguinal 2017   IR IMAGING GUIDED PORT INSERTION  05/15/2020   LEFT HEART CATH AND CORONARY ANGIOGRAPHY N/A 09/02/2018   Procedure: LEFT HEART CATH AND CORONARY ANGIOGRAPHY;  Surgeon: Sherren Mocha, MD;  Location: [place] [facility];  Service: Cardiovascular;  Laterality: N/A;   UPPER ESOPHAGEAL ENDOSCOPIC ULTRASOUND (EUS) N/A 05/02/2020   Procedure: UPPER ESOPHAGEAL ENDOSCOPIC ULTRASOUND (EUS);  Surgeon: [name], MD;  Location: [facility];  Service: Endoscopy;  Laterality: N/A;    Current Medications: Current Outpatient Medications on File Prior to Visit  Medication Sig   aspirin EC 81 MG tablet Take 1 tablet (81 mg total) by mouth daily.   dapagliflozin propanediol (FARXIGA) 5 MG TABS tablet Take 1 tablet (5 mg total) by mouth daily.   dronabinol (MARINOL) 5 MG capsule Take 1 capsule (5 mg total) by mouth 2 (two) times daily before lunch and supper.   insulin aspart (NOVOLOG FLEXPEN) 100 UNIT/ML FlexPen Inject 6 Units into the skin 3 (three) times daily with meals.   Insulin Glargine (BASAGLAR KWIKPEN) 100 UNIT/ML Inject 20 Units into the skin daily. (Patient taking differently: Inject 22 Units into the skin daily.)   LORazepam (ATIVAN) 0.5 MG tablet Take 1 tablet (0.5 mg total) by mouth every 6 (six) hours as needed (Nausea or vomiting).    metoCLOPramide (REGLAN) 10 MG tablet Take 1 tablet (10 mg total) by mouth 4 (four) times daily -  before meals and at bedtime.   mupirocin ointment (BACTROBAN) 2 % Place 1 application into the nose at bedtime.   nitroGLYCERIN (NITROSTAT) 0.4 MG SL tablet Place 0.4 mg under the tongue every 5 (five) minutes as needed for chest pain.   ondansetron (ZOFRAN) 4 MG tablet Take 1 tablet (4 mg total) by mouth every 8 (eight) hours as needed for nausea or vomiting.   pantoprazole (PROTONIX) 40 MG tablet Take 1 tablet (40 mg total) by mouth daily.   pregabalin (LYRICA) 300 MG capsule TAKE 1 CAPSULE BY MOUTH EVERYDAY AT BEDTIME   prochlorperazine (COMPAZINE) 10 MG tablet Take 1 tablet (10 mg total) by mouth every 6 (six) hours as needed (Nausea or vomiting).   Saw Palmetto, Serenoa repens, (SAW PALMETTO PO) Take 1 capsule by mouth daily as needed (When having prostate problems).   SUMAtriptan (IMITREX) 25 MG tablet TAKE 1 TABLET BY MOUTH EVERY 2 HOURS AS NEEDED FOR MIGRAINE.   Wheat Dextrin (BENEFIBER PO) Take 1 Scoop by mouth daily.   Continuous  Blood Gluc Receiver (DEXCOM G6 RECEIVER) DEVI Use as directed E11.65   Continuous Blood Gluc Sensor (DEXCOM G6 SENSOR) MISC 1 Device by Does not apply route as directed.   Continuous Blood Gluc Transmit (DEXCOM G6 TRANSMITTER) MISC 1 Device by Does not apply route as directed.   diphenoxylate-atropine (LOMOTIL) 2.5-0.025 MG tablet TAKE 2 TABLETS BY MOUTH 4 (FOUR) TIMES DAILY AS NEEDED FOR DIARRHEA OR LOOSE STOOLS.   fentaNYL (DURAGESIC) 50 MCG/HR Place 1 patch onto the skin every 3 (three) days.   glucose blood (ONETOUCH ULTRA) test strip USE TWICE DAILY AS DIRECTED ( E11.65)   hyoscyamine (LEVSIN SL) 0.125 MG SL tablet Place 1 tablet (0.125 mg total) under the tongue every 4 (four) hours as needed.   Insulin Pen Needle 32G X 4 MM MISC 1 Device by Does not apply route in the morning, at noon, in the evening, and at bedtime.   lidocaine-prilocaine (EMLA) cream APPLY  TO AFFECTED AREA ONCE AS DIRECTED   lipase/protease/amylase (CREON) 36000 UNITS CPEP capsule Take 2 capsules (72,000 Units total) by mouth 3 (three) times daily with meals. May also take 1 capsule (36,000 Units total) as needed (with snacks).   loperamide (IMODIUM) 2 MG capsule TAKE 2 AT ONSET OF DIARRHEA, THEN 1 EVERY 2 HOURS UNTIL 12 HOURS WITHOUT A BOWEL MOEVENT. MAY TAKE 2 TAB EVERY 4 HOURS AT BEDTIME. IF DIARRHEA RECURS REPEAT.   OLANZapine (ZYPREXA) 10 MG tablet Take 1 tablet (10 mg total) by mouth at bedtime.   sucralfate (CARAFATE) 1 GM/10ML suspension TAKE 10 MLS (1 G TOTAL) BY MOUTH 4 (FOUR) TIMES DAILY - WITH MEALS AND AT BEDTIME. (Patient not taking: Reported on 11/26/2020)   No current facility-administered medications on file prior to visit.     Allergies:   Statins and Morphine   Social History   Tobacco Use   Smoking status: Every Day    Packs/day: 1.00    Types: Cigarettes   Smokeless tobacco: Former    Types: Nurse, children's Use: Never used  Substance Use Topics   Alcohol use: Not Currently   Drug use: Not Currently    Family History: The patient's family history includes Addison's disease in his sister; Arthritis in his father and mother; COPD in his mother; Dementia in his maternal grandfather, maternal grandmother, and mother; Diabetes in his brother, father, maternal grandfather, maternal grandmother, mother, and sister; Heart disease in his father, maternal grandfather, maternal grandmother, and mother; Hyperlipidemia in his brother, father, maternal grandfather, maternal grandmother, and sister; Hypertension in his brother and sister; Obesity in his brother, brother, and sister; Osteoporosis in his father and mother; Other in his paternal grandmother; Parkinson's disease in his father; Prostate cancer in his paternal grandfather; Rheumatic fever in his son; Stickler syndrome in his son.  ROS:   Please see the history of present illness.  Additional  pertinent ROS otherwise unremarkable.  EKGs/Labs/Other Studies Reviewed:    The following studies were reviewed today:  CT Chest/Abdomen/Pelvis 09/26/2020: COMPARISON:  07/23/2020   FINDINGS: CT CHEST FINDINGS   Cardiovascular: Normal heart size. Aortic atherosclerosis. Coronary artery calcifications. No pericardial effusion.   Mediastinum/Nodes: No enlarged mediastinal, hilar, or axillary lymph nodes. Thyroid gland, trachea, and esophagus demonstrate no significant findings.   Lungs/Pleura: No pleural effusion. Paraseptal and centrilobular emphysema. Scattered calcified granulomas. Bilateral peripheral and lower lung zone predominant subpleural reticulation identified. Small nodule within the periphery of the right lower lobe is nonspecific measuring 3 mm, image 98/4. Not  seen on previous exam.   Musculoskeletal: No chest wall mass or suspicious bone lesions identified.   CT ABDOMEN PELVIS FINDINGS   Hepatobiliary: Multifocal low-attenuation liver lesions identified compatible with metastatic disease. Index lesions include:   Posterior right lobe of liver lesion measures 0.9 x 0.9 cm, image 53/2. Formally 1.2 x 0.7 cm.   Lateral segment left lobe of liver lesion measures 1.2 x 1.1 cm, image 56/2. Unchanged from previous exam   Anterior right lobe of liver lesion measures 1.6 x 1.2, image 66/2. Previously 1.8 by 1.4 cm.   Gallbladder appears normal. There is mild intrahepatic bile duct dilatation which appears similar to the previous exam. Common bile duct is stable measuring 8 mm in diameter. No obstructing stone or mass noted.   Pancreas: Hypoenhancing lesion within the distal tail of pancreas measures 5.0 x 3.2 cm, image 61/2. Formally 5.0 x 3.4 cm. As noted previously the lesion occludes the splenic vein and abuts the SMA and portal venous confluence.   Spleen: Heterogeneous perfusion of the splenic parenchyma.   Adrenals/Urinary Tract: Normal adrenal glands.  The kidneys are unremarkable. No mass or hydronephrosis. Urinary bladder is normal.   Stomach/Bowel: Stomach is within normal limits. Appendix appears normal. No evidence of bowel wall thickening, distention, or inflammatory changes.   Vascular/Lymphatic: Aortic atherosclerosis. No aneurysm. No abdominopelvic adenopathy   Reproductive: Prostate is unremarkable.   Other: No ascites or focal fluid collections.   Musculoskeletal: No acute or significant osseous findings.   IMPRESSION: 1. No significant interval change in size of distal tail of pancreas lesion which occludes the splenic vein and abuts the SMA and portal venous confluence. 2. Stable to slight decrease in size of multifocal liver metastases. 3. Small 3 mm nodule within the periphery of the right lower lobe is nonspecific. Not seen on previous exam. Attention on follow-up imaging is advised. 4. Aortic Atherosclerosis (ICD10-I70.0) and Emphysema (ICD10-J43.9).  Abdominal Aortogram 02/08/2020: Aortogram showed a patent infrarenal aorta with patent bilateral iliac arteries.  His aortoiliac segment vessels were fairly small but no flow-limiting stenosis evident.   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   Right lower extremity arteriogram shows a patent common femoral, profunda, diseased but patent SFA without overt flow limiting stenosis and patent popliteal and three-vessel runoff  Plan: I again encouraged maximal medical management and tobacco cessation.  Given how small his SFA is proximally I think a femoral above knee popliteal bypass on the left would be his best option.  We will get vein mapping today.  He wants surgery in 4 weeks given his father is critically ill and discussed this is elective.  LE Vascular Study  with Exercise 12/18/2019: FINDINGS: Right Lower Extremity   Resting ABI:  0.78   Resting TBI: 0.62   Post Exercise ABI:0.76   Segmental Pressures: Greater than 20 mm pressure gradient between the common femoral and distal superficial femoral arteries. Great toe pressure: 72   Arterial Waveforms: Normal tri-phasic arterial waveforms in the common femoral with dampened biphasic and monophasic waveforms in the popliteal and calf arteries.   PVRs: Dampened PVRs distally.   Left Lower Extremity:   Resting ABI: 0.73   Resting TBI: 0.62   Post Exercise ABI:0.83   Segmental Pressures: Significant pressure decrease from the left common femoral to distal superficial  femoral arteries. Great toe pressure: 69   Arterial Waveforms: Normal tri-phasic arterial waveforms in the common femoral dampened biphasic and monophasic waveforms in the popliteal and calf arteries.   PVRs: Dampened PVRs distally.   Other: Symmetric upper extremity pressures.   Ankle Brachial index   > 1.4 Non diagnostic secondary to incompressible vessel calcifications   1.0-1.4       Normal   0.9-0.99     Borderline PAD   0.8-0.89     Mild PAD   0.5-0.79     Moderate PAD   < 0.5          Severe PAD   Toe Brachial Index   Normal     >0.65   Moderate  0.53-0.64   Severe     <0.23   Toe Pressures   Absolute toe pressure >37mmHg sufficient for wound healing.   Toe pressures <32mmHg = critical limb ischemia.   IMPRESSION: Moderate bilateral peripheral artery disease. This study is suggestive of bilateral nearly symmetric proximal outflow (superficial femoral artery) stenoses.   Recommend consideration of referral to Interventional Radiology or other endovascular specialist for evaluation and management of peripheral artery disease.  Cath 09/02/18 as above  EKG:  EKG is personally reviewed.   11/26/2020: EKG was not ordered. 09/13/2018: NSR  Recent Labs: 04/01/2020: TSH  1.05 11/21/2020: ALT 42; BUN 22; Creatinine 0.63; Hemoglobin 12.9; Platelet Count 151; Potassium 4.7; Sodium 131   Recent Lipid Panel    Component Value Date/Time   CHOL 130 04/01/2020 1559   CHOL 177 05/01/2019 1108   TRIG 217.0 (H) 04/01/2020 1559   HDL 39.20 04/01/2020 1559   HDL 34 (L) 05/01/2019 1108   CHOLHDL 3 04/01/2020 1559   VLDL 43.4 (H) 04/01/2020 1559   LDLCALC 93 05/01/2019 1108   LDLDIRECT 62.0 04/01/2020 1559    Physical Exam:    VS:  BP 140/83   Pulse 65   Ht 5\' 3"  (1.6 m)   Wt 124 lb 3.2 oz (56.3 kg)   BMI 22.00 kg/m     Wt Readings from Last 3 Encounters:  11/26/20 124 lb 3.2 oz (56.3 kg)  11/21/20 128 lb 1.9 oz (58.1 kg)  10/18/20 129 lb (58.5 kg)    VITAL SIGNS:  reviewed GEN:  no acute distress, but has lost visible weight EYES:  sclerae anicteric, EOMI - Extraocular Movements Intact RESPIRATORY:  normal respiratory effort, symmetric expansion CARDIOVASCULAR:  no visible JVD SKIN:  no rash, lesions or ulcers. MUSCULOSKELETAL:  no obvious deformities. NEURO:  alert and oriented x 3, no obvious focal deficit PSYCH:  normal affect   ASSESSMENT:    1. Coronary artery disease of native artery of native heart with stable angina pectoris ([place])   2. Status post angioplasty with stent   3. Peripheral vascular disease ([place])   4. Pancreatic cancer metastasized to liver [facility])    PLAN:    Metastatic pancreatic cancer: -followed by Dr. Marin Olp -we discussed that if his platelets remain low after chemo treatment in the future, ok to stop clopidogrel and continue aspirin. He would like to continue both for now as he has not had significant bleeding issues.  Type II diabetes, without obesity: -now secondary issue give pancreatic cancer, poor nutrition  Carotid stenosis s/p R CEA: History of CAD, s/p 2 additional stents 08/2018:  -rare, stable angina -continue aspirin 81 mg -continue clopidogrel 12 months post PCI. We discussed stopping today, but he  would like to continue unless he has  significant bleedings issues  -counseled on red flag warning signs that need immediate medical attention -has PRN SL NG   Hypercholesterolemia plus possible hypertriglyceridemia: LDL goal <70.  -was on rosuvastatin/ezetimibe, stopped with chemo. Chemo takes priority.   Hypertension: goal <130/80 -has been stable to low -on no agents at this time, monitor  Continues tobacco abuse: not interested in quitting at this time given his cancer  Plan for follow up: 6 mos or sooner as needed. I encouraged him to reach out with any needs or concerns.  Time:   Today, I have spent 14 minutes with the patient with telehealth technology discussing the above problems.    Medication Adjustments/Labs and Tests Ordered: Current medicines are reviewed at length with the patient today.  Concerns regarding medicines are outlined above.   No orders of the defined types were placed in this encounter.  Meds ordered this encounter  Medications   clopidogrel (PLAVIX) 75 MG tablet    Sig: Take 1 tablet (75 mg total) by mouth daily.    Dispense:  90 tablet    Refill:  3    Patient Instructions  Medication Instructions:  Your Physician recommend you continue on your current medication as directed.    *If you need a refill on your cardiac medications before your next appointment, please call your pharmacy*   Lab Work: None ordered today   Testing/Procedures: None ordered today    Follow-Up: At [facility], you and your health needs are our priority.  As part of our continuing mission to provide you with exceptional heart care, we have created designated Provider Care Teams.  These Care Teams include your primary Cardiologist (physician) and Advanced Practice Providers (APPs -  Physician Assistants and Nurse Practitioners) who all work together to provide you with the care you need, when you need it.  We recommend signing up for the patient portal called  "MyChart".  Sign up information is provided on this After Visit Summary.  MyChart is used to connect with patients for Virtual Visits (Telemedicine).  Patients are able to view lab/test results, encounter notes, upcoming appointments, etc.  Non-urgent messages can be sent to your provider as well.   To learn more about what you can do with MyChart, go to [email or web address].    Your next appointment:   6 month(s)  The format for your next appointment:   In Person  Provider:   Buford Dresser, MD     Signed, Buford Dresser, MD PhD 11/26/2020   Rosa Sanchez

## 2020-11-26 NOTE — Patient Instructions (Signed)

## 2020-12-01 ENCOUNTER — Other Ambulatory Visit (HOSPITAL_BASED_OUTPATIENT_CLINIC_OR_DEPARTMENT_OTHER): Payer: Self-pay | Admitting: Cardiology

## 2020-12-01 DIAGNOSIS — Z95820 Peripheral vascular angioplasty status with implants and grafts: Secondary | ICD-10-CM

## 2020-12-01 DIAGNOSIS — I25118 Atherosclerotic heart disease of native coronary artery with other forms of angina pectoris: Secondary | ICD-10-CM

## 2020-12-02 ENCOUNTER — Encounter: Payer: Self-pay | Admitting: Family Medicine

## 2020-12-02 ENCOUNTER — Other Ambulatory Visit: Payer: Self-pay | Admitting: Family Medicine

## 2020-12-02 ENCOUNTER — Inpatient Hospital Stay: Payer: Medicare Other | Admitting: Genetic Counselor

## 2020-12-02 ENCOUNTER — Telehealth: Payer: Self-pay | Admitting: Genetic Counselor

## 2020-12-02 MED ORDER — LIDOCAINE-HYDROCORTISONE ACE 2.8-0.55 % RE GEL: 1.0000 | Freq: Two times a day (BID) | RECTAL | 0 refills | Status: DC | PRN

## 2020-12-02 NOTE — Telephone Encounter (Signed)
Sch per 11/28 inbasket , left msg

## 2020-12-02 NOTE — Telephone Encounter (Signed)
Patient called stating he has been in a lot of pain for the past 2 weeks every time he has a bowel movement. An appointment was made for 11/29 with Melissa but he still wanted Charlett Blake to give him advice on what to do.

## 2020-12-03 ENCOUNTER — Other Ambulatory Visit: Payer: Self-pay | Admitting: Family Medicine

## 2020-12-03 ENCOUNTER — Ambulatory Visit (INDEPENDENT_AMBULATORY_CARE_PROVIDER_SITE_OTHER): Payer: Medicare Other | Admitting: Family

## 2020-12-03 ENCOUNTER — Other Ambulatory Visit: Payer: Self-pay

## 2020-12-03 ENCOUNTER — Encounter: Payer: Self-pay | Admitting: Family

## 2020-12-03 ENCOUNTER — Telehealth: Payer: Self-pay | Admitting: Family

## 2020-12-03 VITALS — BP 110/61 | HR 71 | Temp 98.3°F | Resp 16 | Ht 64.0 in | Wt 128.2 lb

## 2020-12-03 DIAGNOSIS — K6289 Other specified diseases of anus and rectum: Secondary | ICD-10-CM | POA: Diagnosis not present

## 2020-12-03 DIAGNOSIS — N4281 Prostatodynia syndrome: Secondary | ICD-10-CM

## 2020-12-03 DIAGNOSIS — R3989 Other symptoms and signs involving the genitourinary system: Secondary | ICD-10-CM | POA: Diagnosis not present

## 2020-12-03 DIAGNOSIS — N429 Disorder of prostate, unspecified: Secondary | ICD-10-CM

## 2020-12-03 DIAGNOSIS — I25118 Atherosclerotic heart disease of native coronary artery with other forms of angina pectoris: Secondary | ICD-10-CM

## 2020-12-03 MED ORDER — LIDOCAINE (ANORECTAL) 5 % EX GEL: CUTANEOUS | 1 refills | Status: DC

## 2020-12-03 NOTE — Patient Instructions (Signed)
Please complete lab work prior to leaving.   

## 2020-12-03 NOTE — Progress Notes (Addendum)
Subjective:   By signing my name below, I, Dean Singh, attest that this documentation has been prepared under the direction and in the presence of Dean Alar, NP, 12/03/2020   Patient ID: Dean Singh Dean Singh, male    DOB: April 12, 1960, 60 y.o.   MRN: [id number]  Chief Complaint  Patient presents with   Rectal Pain    HPI Patient is in today for an office visit.  Oncology: He has pancreatic cancer that metastasized to his liver. He is working with his oncologist to treat this with chemotherapy. He believes that his life expectancy at this point is about 100 days. Bowel pain: He is experiencing pain when he passes a bowel movement. He notes that when he needs to use the bathroom he will feel the pain before he ever feels the urge to pass a bowel movement. He notes that he has had external hemorrhoids removed before but has not noticed any since the onset of his pain. He mentions a history of prostate abnormality but notes this is the first time he has had an issue since.   Health Maintenance Due  Topic Date Due   OPHTHALMOLOGY EXAM  Never done   Zoster Vaccines- Shingrix (1 of 2) Never done   FOOT EXAM  08/10/2020   URINE MICROALBUMIN  08/10/2020    Past Medical History:  Diagnosis Date   Arthritis    DDD   Bladder cancer ([place])    Bladder cancer ([place]) 08/17/2018   CAD in native artery 08/17/2018   Current nicotine use 08/17/2018   Diabetes mellitus without complication (HCC)    GERD (gastroesophageal reflux disease)    Goals of care, counseling/discussion 04/24/2020   H/O: stroke    Heart attack ([place])    2 stents   Hx of hemorrhoids 08/17/2018   Hyperlipidemia    Hypertension    Pancreatic cancer metastasized to liver ([place]) 04/24/2020   Peripheral neuropathy 08/17/2018   Stroke ([place]) 57   Visual changes 08/17/2018    Past Surgical History:  Procedure Laterality Date   ABDOMINAL AORTOGRAM W/LOWER EXTREMITY Bilateral 02/08/2020   Procedure: ABDOMINAL AORTOGRAM  W/LOWER EXTREMITY;  Surgeon: Marty Heck, MD;  Location: [facility];  Service: Cardiovascular;  Laterality: Bilateral;   BLADDER TUMOR EXCISION     cancer   CAROTID ENDARTERECTOMY Left    CORONARY ANGIOPLASTY WITH STENT PLACEMENT     2 stent   CORONARY STENT INTERVENTION N/A 09/02/2018   Procedure: CORONARY STENT INTERVENTION;  Surgeon: Sherren Mocha, MD;  Location: [facility];  Service: Cardiovascular;  Laterality: N/A;   ESOPHAGOGASTRODUODENOSCOPY (EGD) WITH PROPOFOL N/A 05/02/2020   Procedure: ESOPHAGOGASTRODUODENOSCOPY (EGD) WITH PROPOFOL;  Surgeon: [name], MD;  Location: [facility];  Service: Endoscopy;  Laterality: N/A;   FINE NEEDLE ASPIRATION N/A 05/02/2020   Procedure: FINE NEEDLE ASPIRATION (FNA) LINEAR;  Surgeon: [name], MD;  Location: [facility];  Service: Endoscopy;  Laterality: N/A;   HERNIA REPAIR     right inguinal 2017   IR IMAGING GUIDED PORT INSERTION  05/15/2020   LEFT HEART CATH AND CORONARY ANGIOGRAPHY N/A 09/02/2018   Procedure: LEFT HEART CATH AND CORONARY ANGIOGRAPHY;  Surgeon: Sherren Mocha, MD;  Location: [facility];  Service: Cardiovascular;  Laterality: N/A;   UPPER ESOPHAGEAL ENDOSCOPIC ULTRASOUND (EUS) N/A 05/02/2020   Procedure: UPPER ESOPHAGEAL ENDOSCOPIC ULTRASOUND (EUS);  Surgeon: [name], MD;  Location: [facility];  Service: Endoscopy;  Laterality: N/A;    Family History  Problem Relation Age of Onset   COPD Mother    Diabetes Mother    Dementia Mother    Heart disease Mother        chf   Arthritis Mother    Osteoporosis Mother        h/o cigarette smoking   Heart disease Father        stents 3 x 2, pacer, chf   Parkinson's disease Father    Hyperlipidemia Father    Diabetes Father    Arthritis Father        DDD   Osteoporosis Father        h/o cigarette use   Addison's disease Sister    Diabetes Sister    Hyperlipidemia Sister    Hypertension Sister    Obesity Sister     Hyperlipidemia Brother    Hypertension Brother    Diabetes Brother    Obesity Brother    Rheumatic fever Son    Stickler syndrome Son    Diabetes Maternal Grandmother    Dementia Maternal Grandmother    Heart disease Maternal Grandmother        chf   Hyperlipidemia Maternal Grandmother    Diabetes Maternal Grandfather    Dementia Maternal Grandfather    Heart disease Maternal Grandfather        cad   Hyperlipidemia Maternal Grandfather    Other Paternal Grandmother        scarlet fever   Prostate cancer Paternal Grandfather    Obesity Brother     Social History   Socioeconomic History   Marital status: Married    Spouse name: Not on file   Number of children: Not on file   Years of education: Not on file   Highest education level: Not on file  Occupational History   Not on file  Tobacco Use   Smoking status: Every Day    Packs/day: 1.00    Types: Cigarettes   Smokeless tobacco: Former    Types: Nurse, children's Use: Never used  Substance and Sexual Activity   Alcohol use: Not Currently   Drug use: Not Currently   Sexual activity: Yes  Other Topics Concern   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     clopidogrel (PLAVIX) 75 MG tablet Take 1 tablet (75 mg total) by mouth daily. 90 tablet 3   Continuous Blood Gluc Receiver (DEXCOM G6 RECEIVER) DEVI Use as directed E11.65 1 each 0   Continuous  Blood Gluc Sensor (DEXCOM G6 SENSOR) MISC 1 Device by Does not apply route as directed. 9 each 3   Continuous Blood Gluc Transmit (DEXCOM G6 TRANSMITTER) MISC 1 Device by Does not apply route as directed. 1 each 3   dapagliflozin propanediol (FARXIGA) 5 MG TABS tablet Take 1 tablet (5 mg total) by mouth daily. 90 tablet 1  diphenoxylate-atropine (LOMOTIL) 2.5-0.025 MG tablet TAKE 2 TABLETS BY MOUTH 4 (FOUR) TIMES DAILY AS NEEDED FOR DIARRHEA OR LOOSE STOOLS. 100 tablet 0   dronabinol (MARINOL) 5 MG capsule Take 1 capsule (5 mg total) by mouth 2 (two) times daily before lunch and supper. 60 capsule 0   fentaNYL (DURAGESIC) 50 MCG/HR Place 1 patch onto the skin every 3 (three) days. 10 patch 0   glucose blood (ONETOUCH ULTRA) test strip USE TWICE DAILY AS DIRECTED ( E11.65) 200 each 12   hyoscyamine (LEVSIN SL) 0.125 MG SL tablet Place 1 tablet (0.125 mg total) under the tongue every 4 (four) hours as needed. 20 tablet 1   insulin aspart (NOVOLOG FLEXPEN) 100 UNIT/ML FlexPen Inject 6 Units into the skin 3 (three) times daily with meals. 15 mL 6   Insulin Glargine (BASAGLAR KWIKPEN) 100 UNIT/ML Inject 20 Units into the skin daily. (Patient taking differently: Inject 22 Units into the skin daily.) 30 mL 4   Insulin Pen Needle 32G X 4 MM MISC 1 Device by Does not apply route in the morning, at noon, in the evening, and at bedtime. 400 each 3   lidocaine-prilocaine (EMLA) cream APPLY TO AFFECTED AREA ONCE AS DIRECTED 30 g 3   lipase/protease/amylase (CREON) 36000 UNITS CPEP capsule Take 2 capsules (72,000 Units total) by mouth 3 (three) times daily with meals. May also take 1 capsule (36,000 Units total) as needed (with snacks). 240 capsule 11   loperamide (IMODIUM) 2 MG capsule TAKE 2 AT ONSET OF DIARRHEA, THEN 1 EVERY 2 HOURS UNTIL 12 HOURS WITHOUT A BOWEL MOEVENT. MAY TAKE 2 TAB EVERY 4 HOURS AT BEDTIME. IF DIARRHEA RECURS REPEAT. 100 capsule 1   LORazepam (ATIVAN) 0.5 MG tablet Take 1 tablet (0.5 mg  total) by mouth every 6 (six) hours as needed (Nausea or vomiting). 30 tablet 0   metoCLOPramide (REGLAN) 10 MG tablet Take 1 tablet (10 mg total) by mouth 4 (four) times daily -  before meals and at bedtime. 120 tablet 3   mupirocin ointment (BACTROBAN) 2 % Place 1 application into the nose at bedtime. 22 g 1   nitroGLYCERIN (NITROSTAT) 0.4 MG SL tablet Place 0.4 mg under the tongue every 5 (five) minutes as needed for chest pain.     OLANZapine (ZYPREXA) 10 MG tablet Take 1 tablet (10 mg total) by mouth at bedtime. 30 tablet 3   ondansetron (ZOFRAN) 4 MG tablet Take 1 tablet (4 mg total) by mouth every 8 (eight) hours as needed for nausea or vomiting. 40 tablet 0   pantoprazole (PROTONIX) 40 MG tablet Take 1 tablet (40 mg total) by mouth daily. 90 tablet 3   pregabalin (LYRICA) 300 MG capsule TAKE 1 CAPSULE BY MOUTH EVERYDAY AT BEDTIME 30 capsule 5   prochlorperazine (COMPAZINE) 10 MG tablet Take 1 tablet (10 mg total) by mouth every 6 (six) hours as needed (Nausea or vomiting). 30 tablet 1   Saw Palmetto, Serenoa repens, (SAW PALMETTO PO) Take 1 capsule by mouth daily as needed (When having prostate problems).     sucralfate (CARAFATE) 1 GM/10ML suspension TAKE 10 MLS (1 G TOTAL) BY MOUTH 4 (FOUR) TIMES DAILY - WITH MEALS AND AT BEDTIME. 420 mL 1   SUMAtriptan (IMITREX) 25 MG tablet TAKE 1 TABLET BY MOUTH EVERY 2 HOURS AS NEEDED FOR MIGRAINE. 12 tablet 2   Wheat Dextrin (BENEFIBER PO) Take 1 Scoop by mouth daily.     Lidocaine-Hydrocortisone Ace 2.8-0.55 % GEL PLACE 1 DOSE RECTALLY  2 (TWO) TIMES DAILY AS NEEDED. 100 g 0   No facility-administered medications prior to visit.    Allergies  Allergen Reactions   Statins Itching    Atorvastatin cause "my skin to feel horrible and unbearable".   Morphine Nausea And Vomiting    "felt like chest was exploding" pt reports     Review of Systems  Gastrointestinal:  Negative for blood in stool.       + perirectal pain      Objective:     Physical Exam Exam conducted with a chaperone present.  Constitutional:      General: He is not in acute distress.    Appearance: Normal appearance. He is not ill-appearing.  HENT:     Head: Normocephalic and atraumatic.     Right Ear: External ear normal.     Left Ear: External ear normal.  Eyes:     Extraocular Movements: Extraocular movements intact.     Pupils: Pupils are equal, round, and reactive to light.  Cardiovascular:     Rate and Rhythm: Normal rate and regular rhythm.     Heart sounds: Normal heart sounds. No murmur heard.   No gallop.  Pulmonary:     Effort: Pulmonary effort is normal. No respiratory distress.     Breath sounds: Normal breath sounds. No wheezing or rales.  Genitourinary:    Prostate: Tender.     Rectum: Tenderness present. No external hemorrhoid or internal hemorrhoid.     Comments: Normal sized prostate, no nodules, + tenderness to palpation (-) palpable rectal fissure (-) hemocult negative stool Lymphadenopathy:     Cervical: No cervical adenopathy.  Skin:    General: Skin is warm and dry.  Neurological:     Mental Status: He is alert and oriented to person, place, and time.  Psychiatric:        Behavior: Behavior normal.        Judgment: Judgment normal.    BP 110/61 (BP Location: Right Arm, Patient Position: Sitting, Cuff Size: Small)   Pulse 71   Temp 98.3 F (36.8 C) (Oral)   Resp 16   Ht 5\' 4"  (1.626 m)   Wt 128 lb 3.2 oz (58.2 kg)   SpO2 99%   BMI 22.01 kg/m  Wt Readings from Last 3 Encounters:  12/04/20 130 lb (59 kg)  12/03/20 128 lb 3.2 oz (58.2 kg)  11/26/20 124 lb 3.2 oz (56.3 kg)       Assessment & Plan:   Problem List Items Addressed This Visit       Unprioritized   Rectal pain    New. This is quite uncomfortable for him and interfering with his sleep. Could be a fissure, though I did not palpate a fissure on exam. Will treat with OTC lidocaine rectal gel and OTC hydrocortisone rectal suppositories as combo rx  was not covered by insurance.  Will also refer to GI for further evaluation.       Relevant Orders   PSA (Completed)   Urinalysis, Routine w reflex microscopic (Completed)   Urine Culture (Completed)   Ambulatory referral to Gastroenterology   Other Visit Diagnoses     Urinary problem    -  Primary   Relevant Orders   PSA (Completed)   Urinalysis, Routine w reflex microscopic (Completed)   Urine Culture (Completed)   Tender prostate       Relevant Orders   PSA (Completed)      Meds ordered this encounter  Medications  Lidocaine, Anorectal, 5 % GEL    Sig: Use bid prn    Dispense:  30 g    Refill:  1    Order Specific Question:   Supervising Provider    Answer:   Penni Homans A [[id number]]    I, Dean Alar, NP, personally preformed the services described in this documentation.  All medical record entries made by the scribe were at my direction and in my presence.  I have reviewed the chart and discharge instructions (if applicable) and agree that the record reflects my personal performance and is accurate and complete. 12/03/2020  I,Dean Singh,acting as a Education administrator for Nance Pear, NP.,have documented all relevant documentation on the behalf of Nance Pear, NP,as directed by  Nance Pear, NP while in the presence of Nance Pear, NP.  Nance Pear, NP

## 2020-12-03 NOTE — Telephone Encounter (Signed)
You saw patient today, but did he mention anything about this medication not being covered?

## 2020-12-03 NOTE — Telephone Encounter (Signed)
See mychart.  

## 2020-12-03 NOTE — Telephone Encounter (Signed)
Pharmacy comment: Alternative Requested:DR[facility] NOT COVERED ITS $360. CAN YOU CHANGE TO SOMETHING ELSE.

## 2020-12-04 ENCOUNTER — Encounter: Payer: Self-pay | Admitting: Family

## 2020-12-04 ENCOUNTER — Inpatient Hospital Stay: Payer: Medicare Other

## 2020-12-04 ENCOUNTER — Inpatient Hospital Stay (HOSPITAL_BASED_OUTPATIENT_CLINIC_OR_DEPARTMENT_OTHER): Payer: Medicare Other | Admitting: Family

## 2020-12-04 ENCOUNTER — Encounter: Payer: Self-pay | Admitting: *Deleted

## 2020-12-04 VITALS — BP 95/61 | HR 93 | Temp 98.5°F | Resp 17 | Wt 130.0 lb

## 2020-12-04 DIAGNOSIS — C787 Secondary malignant neoplasm of liver and intrahepatic bile duct: Secondary | ICD-10-CM

## 2020-12-04 DIAGNOSIS — C259 Malignant neoplasm of pancreas, unspecified: Secondary | ICD-10-CM | POA: Diagnosis not present

## 2020-12-04 LAB — CBC WITH DIFFERENTIAL (CANCER CENTER ONLY)
Abs Immature Granulocytes: 0.03 10*3/uL (ref 0.00–0.07)
Basophils Absolute: 0.1 10*3/uL (ref 0.0–0.1)
Basophils Relative: 1 %
Eosinophils Absolute: 0.3 10*3/uL (ref 0.0–0.5)
Eosinophils Relative: 5 %
HCT: 39.7 % (ref 39.0–52.0)
Hemoglobin: 13.2 g/dL (ref 13.0–17.0)
Immature Granulocytes: 1 %
Lymphocytes Relative: 27 %
Lymphs Abs: 1.7 10*3/uL (ref 0.7–4.0)
MCH: 31.8 pg (ref 26.0–34.0)
MCHC: 33.2 g/dL (ref 30.0–36.0)
MCV: 95.7 fL (ref 80.0–100.0)
Monocytes Absolute: 0.6 10*3/uL (ref 0.1–1.0)
Monocytes Relative: 9 %
Neutro Abs: 3.7 10*3/uL (ref 1.7–7.7)
Neutrophils Relative %: 57 %
Platelet Count: 119 10*3/uL — ABNORMAL LOW (ref 150–400)
RBC: 4.15 MIL/uL — ABNORMAL LOW (ref 4.22–5.81)
RDW: 14.6 % (ref 11.5–15.5)
WBC Count: 6.4 10*3/uL (ref 4.0–10.5)
nRBC: 0 % (ref 0.0–0.2)

## 2020-12-04 LAB — URINALYSIS, ROUTINE W REFLEX MICROSCOPIC
Hgb urine dipstick: NEGATIVE
Ketones, ur: NEGATIVE
Leukocytes,Ua: NEGATIVE
Nitrite: NEGATIVE
Specific Gravity, Urine: >=1.03 — AB (ref 1.000–1.030)
Urine Glucose: >=1000 — AB
Urobilinogen, UA: 1 (ref 0.0–1.0)
pH: 5.5 (ref 5.0–8.0)

## 2020-12-04 LAB — URINE CULTURE
MICRO NUMBER:: 12690011
Result:: NO GROWTH
SPECIMEN QUALITY:: ADEQUATE

## 2020-12-04 LAB — CMP (CANCER CENTER ONLY)
ALT: 51 U/L — ABNORMAL HIGH (ref 0–44)
AST: 64 U/L — ABNORMAL HIGH (ref 15–41)
Albumin: 3.5 g/dL (ref 3.5–5.0)
Alkaline Phosphatase: 579 U/L — ABNORMAL HIGH (ref 38–126)
Anion gap: 7 (ref 5–15)
BUN: 15 mg/dL (ref 6–20)
CO2: 26 mmol/L (ref 22–32)
Calcium: 9.5 mg/dL (ref 8.9–10.3)
Chloride: 102 mmol/L (ref 98–111)
Creatinine: 0.66 mg/dL (ref 0.61–1.24)
GFR, Estimated: >60 mL/min (ref >60)
Glucose, Bld: 240 mg/dL — ABNORMAL HIGH (ref 70–99)
Potassium: 4 mmol/L (ref 3.5–5.1)
Sodium: 135 mmol/L (ref 135–145)
Total Bilirubin: 1.6 mg/dL — ABNORMAL HIGH (ref 0.3–1.2)
Total Protein: 7.6 g/dL (ref 6.5–8.1)

## 2020-12-04 LAB — PSA: PSA: 0.17 ng/mL (ref 0.10–4.00)

## 2020-12-04 NOTE — Progress Notes (Signed)
Hematology and Oncology Follow Up Visit  Dean Singh [id number] 08-Nov-1960 60 y.o. 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 He states that the Reglan his increased his episodes of diarrhea.  He is still having random episodes of nausea but this is now occurring every 2-3 days.  He has been taking his Zyprexa at bedtime as well as the Marinol and Zofran. He will try taking Ativan sublingual for nausea.  He states that despite the episodes of nausea his appetite is much better. He has gained 6 lbs over the last 2 weeks. He is doing his best to stay well hydrated.  His blood sugars are still running high but he states that with his new insulin regimen he has episodes hypoglycemia as well. His LFT's are elevated today. I will route to his endocrinologist and Dr. Marin Singh is also aware.  He denies fever, chills, cough, rash, dizziness, SOB, chest pain, palpitations, abdominal pain or changes in bladder habits.  No falls or syncope to report.  No swelling in his extremities at this time.  Neuropathy in his hands and feet is unchanged.  No blood loss noted. No abnormal bruising, no petechiae.   ECOG Performance Status: 1 - Symptomatic but completely ambulatory  Medications:  Allergies as of 12/04/2020       Reactions   Statins Itching   Atorvastatin cause "my skin to feel horrible and unbearable".   Morphine Nausea And Vomiting   "felt like chest was exploding" pt reports        Medication List        Accurate as of December 04, 2020  1:56 PM. If you have any questions, ask your nurse or doctor.          aspirin EC 81 MG  tablet Take 1 tablet (81 mg total) by mouth daily.   Basaglar KwikPen 100 UNIT/ML Inject 20 Units into the skin daily. What changed: how much to take   BENEFIBER PO Take 1 Scoop by mouth daily.   clopidogrel 75 MG tablet Commonly known as: PLAVIX Take 1 tablet (75 mg total) by mouth daily.   dapagliflozin propanediol 5 MG Tabs tablet Commonly known as: Farxiga Take 1 tablet (5 mg total) by mouth daily.   Dexcom G6 Receiver Devi Use as directed E11.65   Dexcom G6 Sensor Misc 1 Device by Does not apply route as directed.   Dexcom G6 Transmitter Misc 1 Device by Does not apply route as directed.   diphenoxylate-atropine 2.5-0.025 MG tablet Commonly known as: LOMOTIL TAKE 2 TABLETS BY MOUTH 4 (FOUR) TIMES DAILY AS NEEDED FOR DIARRHEA OR LOOSE STOOLS.   dronabinol 5 MG capsule Commonly known as: MARINOL Take 1 capsule (5 mg total) by mouth 2 (two) times daily before lunch and supper.   fentaNYL 50 MCG/HR Commonly known as: [place] 1 patch onto the skin every 3 (three) days.   hyoscyamine 0.125 MG SL tablet Commonly known as: LEVSIN SL Place 1 tablet (0.125 mg total) under the tongue every 4 (four) hours as needed.   Insulin Pen Needle 32G X 4 MM Misc 1 Device by Does not  apply route in the morning, at noon, in the evening, and at bedtime.   Lidocaine (Anorectal) 5 % Gel Use bid prn   lidocaine-prilocaine cream Commonly known as: EMLA APPLY TO AFFECTED AREA ONCE AS DIRECTED   lipase/protease/amylase 36000 UNITS Cpep capsule Commonly known as: Creon Take 2 capsules (72,000 Units total) by mouth 3 (three) times daily with meals. May also take 1 capsule (36,000 Units total) as needed (with snacks).   loperamide 2 MG capsule Commonly known as: IMODIUM TAKE 2 AT ONSET OF DIARRHEA, THEN 1 EVERY 2 HOURS UNTIL 12 HOURS WITHOUT A BOWEL MOEVENT. MAY TAKE 2 TAB EVERY 4 HOURS AT BEDTIME. IF DIARRHEA RECURS REPEAT.   LORazepam 0.5 MG tablet Commonly known as:  Ativan Take 1 tablet (0.5 mg total) by mouth every 6 (six) hours as needed (Nausea or vomiting).   metoCLOPramide 10 MG tablet Commonly known as: REGLAN Take 1 tablet (10 mg total) by mouth 4 (four) times daily -  before meals and at bedtime.   mupirocin ointment 2 % Commonly known as: BACTROBAN Place 1 application into the nose at bedtime.   nitroGLYCERIN 0.4 MG SL tablet Commonly known as: NITROSTAT Place 0.4 mg under the tongue every 5 (five) minutes as needed for chest pain.   NovoLOG FlexPen 100 UNIT/ML FlexPen Generic drug: insulin aspart Inject 6 Units into the skin 3 (three) times daily with meals.   OLANZapine 10 MG tablet Commonly known as: ZYPREXA Take 1 tablet (10 mg total) by mouth at bedtime.   ondansetron 4 MG tablet Commonly known as: Zofran Take 1 tablet (4 mg total) by mouth every 8 (eight) hours as needed for nausea or vomiting.   OneTouch Ultra test strip Generic drug: glucose blood USE TWICE DAILY AS DIRECTED ( E11.65)   pantoprazole 40 MG tablet Commonly known as: PROTONIX Take 1 tablet (40 mg total) by mouth daily.   pregabalin 300 MG capsule Commonly known as: LYRICA TAKE 1 CAPSULE BY MOUTH EVERYDAY AT BEDTIME   prochlorperazine 10 MG tablet Commonly known as: COMPAZINE Take 1 tablet (10 mg total) by mouth every 6 (six) hours as needed (Nausea or vomiting).   SAW PALMETTO PO Take 1 capsule by mouth daily as needed (When having prostate problems).   sucralfate 1 GM/10ML suspension Commonly known as: CARAFATE TAKE 10 MLS (1 G TOTAL) BY MOUTH 4 (FOUR) TIMES DAILY - WITH MEALS AND AT BEDTIME.   SUMAtriptan 25 MG tablet Commonly known as: IMITREX TAKE 1 TABLET BY MOUTH EVERY 2 HOURS AS NEEDED FOR MIGRAINE.        Allergies:  Allergies  Allergen Reactions   Statins Itching    Atorvastatin cause "my skin to feel horrible and unbearable".   Morphine Nausea And Vomiting    "felt like chest was exploding" pt reports     Past Medical  History, Surgical history, Social history, and Family History were reviewed and updated.  Review of Systems: All other 10 point review of systems is negative.   Physical Exam:  weight is 130 lb (59 kg). His oral temperature is 98.5 F (36.9 C). His blood pressure is 95/61 and his pulse is 93. His respiration is 17 and oxygen saturation is 99%.   Wt Readings from Last 3 Encounters:  12/04/20 130 lb (59 kg)  12/03/20 128 lb 3.2 oz (58.2 kg)  11/26/20 124 lb 3.2 oz (56.3 kg)    Ocular: Sclerae unicteric, pupils equal, round and reactive to light Ear-nose-throat: Oropharynx clear, dentition fair Lymphatic:  No cervical or supraclavicular adenopathy Lungs no rales or rhonchi, good excursion bilaterally Heart regular rate and rhythm, no murmur appreciated Abd soft, nontender, positive bowel sounds MSK no focal spinal tenderness, no joint edema Neuro: non-focal, well-oriented, appropriate affect Breasts: Deferred   Lab Results  Component Value Date   WBC 6.4 12/04/2020   HGB 13.2 12/04/2020   HCT 39.7 12/04/2020   MCV 95.7 12/04/2020   PLT 119 (L) 12/04/2020   Lab Results  Component Value Date   FERRITIN 255 10/24/2020   IRON 88 10/24/2020   TIBC 310 10/24/2020   UIBC 222 10/24/2020   IRONPCTSAT 28 10/24/2020   Lab Results  Component Value Date   RBC 4.15 (L) 12/04/2020   No results found for: KPAFRELGTCHN, LAMBDASER, KAPLAMBRATIO No results found for: IGGSERUM, IGA, IGMSERUM No results found for: TOTALPROTELP, ALBUMINELP, A1GS, A2GS, Arnaldo Natal, GAMS, MSPIKE, SPEI   Chemistry      Component Value Date/Time   NA 131 (L) 11/21/2020 1001   NA 132 (L) 05/01/2019 1108   K 4.7 11/21/2020 1001   CL 98 11/21/2020 1001   CO2 28 11/21/2020 1001   BUN 22 (H) 11/21/2020 1001   BUN 18 05/01/2019 1108   CREATININE 0.63 11/21/2020 1001      Component Value Date/Time   CALCIUM 9.6 11/21/2020 1001   ALKPHOS 596 (H) 11/21/2020 1001   AST 58 (H) 11/21/2020 1001   ALT 42  11/21/2020 1001   BILITOT 1.0 11/21/2020 1001       Impression and Plan: Mr. Clack is a very pleasant 60 yo caucasian gentleman with metastatic adenocarcinoma of the pancrease with extensive liver metastasis.  CBC, CMP and above symptoms discussed with Dr. Marin Singh. We will hold treatment again today and give him another 2 week break.  CA 19-9 two weeks ago was 4,121.  He will try taking his Ativan sublingually and see if this helps stop his nausea.  Thankfully his appetite has improved and he has gained weight. He will continue to hydrate well throughout the day.  Labs routed to his endocrinologist Dr. Kelton Pillar.  They can contact our office with any questions or concerns.   Lottie Dawson, NP 11/30/20221:56 PM

## 2020-12-04 NOTE — Progress Notes (Signed)
Patient's treatment will be held an additional 2 weeks.   Oncology Nurse Navigator Documentation  Oncology Nurse Navigator Flowsheets 12/04/2020  Abnormal Finding Date -  Confirmed Diagnosis Date -  Diagnosis Status -  Planned Course of Treatment -  Phase of Treatment -  Chemotherapy Actual Start Date: -  Navigator Follow Up Date: 12/18/2020  Navigator Follow Up Reason: Follow-up Appointment;Chemotherapy  Navigator Location [facility]  Referral Date to RadOnc/MedOnc -  Navigator Encounter Type Appt/Treatment Plan Review  Telephone -  Treatment Initiated Date -  Patient Visit Type MedOnc  Treatment Phase Active Tx  Barriers/Navigation Needs Coordination of Care;Education  Education -  Interventions None Required  Acuity Level 2-Minimal Needs (1-2 Barriers Identified)  Coordination of Care -  Education Method -  Support Groups/Services Friends and Family  Time Spent with Patient 15

## 2020-12-04 NOTE — Patient Instructions (Signed)

## 2020-12-05 ENCOUNTER — Telehealth: Payer: Self-pay

## 2020-12-05 DIAGNOSIS — K6289 Other specified diseases of anus and rectum: Secondary | ICD-10-CM | POA: Insufficient documentation

## 2020-12-05 DIAGNOSIS — C259 Malignant neoplasm of pancreas, unspecified: Secondary | ICD-10-CM

## 2020-12-05 DIAGNOSIS — C787 Secondary malignant neoplasm of liver and intrahepatic bile duct: Secondary | ICD-10-CM

## 2020-12-05 LAB — CANCER ANTIGEN 19-9: CA 19-9: 4275 U/mL — ABNORMAL HIGH (ref 0–35)

## 2020-12-05 NOTE — Assessment & Plan Note (Signed)
New. This is quite uncomfortable for him and interfering with his sleep. Could be a fissure, though I did not palpate a fissure on exam. Will treat with OTC lidocaine rectal gel and OTC hydrocortisone rectal suppositories as combo rx was not covered by insurance.  Will also refer to GI for further evaluation.

## 2020-12-05 NOTE — Telephone Encounter (Signed)
Lab notified this nurse the prealbumin was unable to be added to patients labs from yesterday. Will place new order for next appointment.

## 2020-12-06 ENCOUNTER — Telehealth: Payer: Self-pay | Admitting: Family

## 2020-12-06 ENCOUNTER — Telehealth: Payer: Self-pay

## 2020-12-06 ENCOUNTER — Encounter: Payer: Self-pay | Admitting: Hematology & Oncology

## 2020-12-06 NOTE — Telephone Encounter (Signed)
Pt scheduled to see Alonza Bogus PA 12/10/20 at 1:30pm. Pt aware of appt.

## 2020-12-06 NOTE — Telephone Encounter (Signed)
-----   Message from Levin Erp, [place] sent at 12/06/2020  8:31 AM EST ----- Regarding: FW: sick patient [place], Can we see if we can get this patient in with ANYONE sooner?  Thanks!  JLL  Melissa-We will try our best. Thanks for reaching out. Ellouise Newer, PA-C ----- Message ----- From: [name], NP Sent: 12/06/2020   7:15 AM EST To: Levin Erp, PA Subject: sick patient                                   Claris Gladden,  I saw this patient the other day- he is very ill with metastatic pancreatic cancer.  His greatest pain right now though is rectal pain.  I was unable to determine cause on DRE.  He is currently scheduled to see you on 12/22.  Wife reached out to me to see if we could possibly work him in sooner since he is in such discomfort.  I just hate for his last days to be so miserable.  Is there any chance you or one of the other providers could work him in next week please?   Thank you!  Melissa

## 2020-12-06 NOTE — Telephone Encounter (Signed)
Scheduled appt per 11/30 los - patient wife confirmed appt date and time

## 2020-12-07 ENCOUNTER — Other Ambulatory Visit: Payer: Self-pay | Admitting: Family Medicine

## 2020-12-09 ENCOUNTER — Other Ambulatory Visit: Payer: Self-pay | Admitting: Hematology & Oncology

## 2020-12-09 ENCOUNTER — Encounter: Payer: Self-pay | Admitting: Hematology & Oncology

## 2020-12-09 DIAGNOSIS — C787 Secondary malignant neoplasm of liver and intrahepatic bile duct: Secondary | ICD-10-CM

## 2020-12-09 MED ORDER — FENTANYL 50 MCG/HR TD PT72: 1.0000 | MEDICATED_PATCH | TRANSDERMAL | 0 refills | Status: AC

## 2020-12-09 MED ORDER — LORAZEPAM 0.5 MG PO TABS: 0.5000 mg | ORAL_TABLET | Freq: Four times a day (QID) | ORAL | 2 refills | Status: AC | PRN

## 2020-12-09 NOTE — Telephone Encounter (Signed)
Medication was discontinued by Dr. Marin Olp.  In note from 09/17/20 he wrote "Ive discontinued some of his because they are not needed."  Do you want to refill?

## 2020-12-10 ENCOUNTER — Ambulatory Visit (INDEPENDENT_AMBULATORY_CARE_PROVIDER_SITE_OTHER): Payer: Medicare Other | Admitting: Gastroenterology

## 2020-12-10 ENCOUNTER — Encounter: Payer: Self-pay | Admitting: Hematology & Oncology

## 2020-12-10 ENCOUNTER — Encounter: Payer: Self-pay | Admitting: Gastroenterology

## 2020-12-10 VITALS — BP 82/42 | HR 64 | Ht 64.0 in | Wt 128.0 lb

## 2020-12-10 DIAGNOSIS — I25118 Atherosclerotic heart disease of native coronary artery with other forms of angina pectoris: Secondary | ICD-10-CM

## 2020-12-10 DIAGNOSIS — K6289 Other specified diseases of anus and rectum: Secondary | ICD-10-CM | POA: Diagnosis not present

## 2020-12-10 MED ORDER — AMBULATORY NON FORMULARY MEDICATION: 1 refills | Status: DC

## 2020-12-10 NOTE — Patient Instructions (Signed)
You have been scheduled for an MRI at [facility] on Thursday 12/12/20. Your appointment time is 9:30 am. Please arrive to admitting (at main entrance of the hospital) 15 minutes prior to your appointment time for registration purposes. Please make certain not to have anything to eat or drink 6 hours prior to your test. In addition, if you have any metal in your body, have a pacemaker or defibrillator, please be sure to let your ordering physician know. This test typically takes 45 minutes to 1 hour to complete. Should you need to reschedule, please call [phone] to do so.  We have sent a prescription for Diltiazem 2% gel to [facility] for you. Using your index finger, you should apply a small amount of medication inside the rectum up to your first knuckle/joint three times daily x 6-8 weeks.  [facility]'s information is below: Address: [address], [place], [place] [address]  [phone]  *Please DO NOT go directly from our office to pick up this medication! Give the pharmacy 1 day to process the prescription as this is compounded and takes time to make.

## 2020-12-10 NOTE — Progress Notes (Signed)
12/10/2020 Dean Singh [id number] May 23, 1960   HISTORY OF PRESENT ILLNESS: This is a 60 year old male who is a patient of Dr. Ardis Hughs, known to him only for evaluation of pancreatic mass.  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  He is currently undergoing chemotherapy treatment with Dr. Marin Olp.  He is here today with complaints of rectal pain.  He tells me he has had issues with thrombosed hemorrhoids in the past.  His PCP evaluated him, but did not see any cause of pain on her exam.  He tells me that this has been present for a few weeks now.  Having hard time sleeping, etc.  He says that even if he passes gas or very soft/loose stool then it hurts.  No rectal bleeding.  No fevers.   Past Medical History:  Diagnosis Date   Arthritis    DDD   Bladder cancer ([place])    Bladder cancer ([place]) 08/17/2018   CAD in native artery 08/17/2018   Current nicotine use 08/17/2018   Diabetes mellitus without complication (HCC)    GERD (gastroesophageal reflux disease)    Goals of care, counseling/discussion 04/24/2020   H/O: stroke    Heart attack ([place])    2 stents   Hx of hemorrhoids 08/17/2018   Hyperlipidemia    Hypertension    Pancreatic cancer metastasized to liver ([place]) 04/24/2020   Peripheral neuropathy 08/17/2018   Stroke ([place]) 57   Visual changes 08/17/2018   Past Surgical History:  Procedure Laterality Date   ABDOMINAL AORTOGRAM W/LOWER EXTREMITY Bilateral 02/08/2020   Procedure: ABDOMINAL AORTOGRAM W/LOWER EXTREMITY;  Surgeon: Marty Heck, MD;  Location: [place] [facility];  Service: Cardiovascular;  Laterality: Bilateral;   BLADDER TUMOR EXCISION     cancer   CAROTID ENDARTERECTOMY Left    CORONARY ANGIOPLASTY WITH STENT PLACEMENT     2 stent   CORONARY STENT INTERVENTION N/A 09/02/2018   Procedure: CORONARY STENT INTERVENTION;  Surgeon: Sherren Mocha, MD;  Location: [place] [facility];   Service: Cardiovascular;  Laterality: N/A;   ESOPHAGOGASTRODUODENOSCOPY (EGD) WITH PROPOFOL N/A 05/02/2020   Procedure: ESOPHAGOGASTRODUODENOSCOPY (EGD) WITH PROPOFOL;  Surgeon: [name], MD;  Location: [facility];  Service: Endoscopy;  Laterality: N/A;   FINE NEEDLE ASPIRATION N/A 05/02/2020   Procedure: FINE NEEDLE ASPIRATION (FNA) LINEAR;  Surgeon: [name], MD;  Location: [facility];  Service: Endoscopy;  Laterality: N/A;   HERNIA REPAIR     right inguinal 2017   IR IMAGING GUIDED PORT INSERTION  05/15/2020   LEFT HEART CATH AND CORONARY ANGIOGRAPHY N/A 09/02/2018   Procedure: LEFT HEART CATH AND CORONARY ANGIOGRAPHY;  Surgeon: Sherren Mocha, MD;  Location: [place] [facility];  Service: Cardiovascular;  Laterality: N/A;   UPPER ESOPHAGEAL ENDOSCOPIC ULTRASOUND (EUS) N/A 05/02/2020   Procedure: UPPER ESOPHAGEAL ENDOSCOPIC ULTRASOUND (EUS);  Surgeon: [name], MD;  Location: [facility];  Service: Endoscopy;  Laterality: N/A;    reports that he has been smoking cigarettes. He has been smoking an average of 1 pack per day. He has quit using smokeless tobacco.  His smokeless tobacco use included chew. He reports that he does not currently use alcohol. He reports that he does not currently use drugs. family history includes Addison's disease in his sister; Arthritis in his father and mother; COPD in his mother; Dementia in his maternal grandfather, maternal grandmother, and mother; Diabetes in his brother,  father, maternal grandfather, maternal grandmother, mother, and sister; Heart disease in his father, maternal grandfather, maternal grandmother, and mother; Hyperlipidemia in his brother, father, maternal grandfather, maternal grandmother, and sister; Hypertension in his brother and sister; Obesity in his brother, brother, and sister; Osteoporosis in his father and mother; Other in his paternal grandmother; Parkinson's disease in his father; Prostate cancer in his paternal  grandfather; Rheumatic fever in his son; Stickler syndrome in his son. Allergies  Allergen Reactions   Statins Itching    Atorvastatin cause "my skin to feel horrible and unbearable".   Morphine Nausea And Vomiting    "felt like chest was exploding" pt reports       Outpatient Encounter Medications as of 12/10/2020  Medication Sig   aspirin EC 81 MG tablet Take 1 tablet (81 mg total) by mouth daily.   clopidogrel (PLAVIX) 75 MG tablet Take 1 tablet (75 mg total) by mouth daily.   Continuous Blood Gluc Receiver (DEXCOM G6 RECEIVER) DEVI Use as directed E11.65   Continuous Blood Gluc Sensor (DEXCOM G6 SENSOR) MISC 1 Device by Does not apply route as directed.   Continuous Blood Gluc Transmit (DEXCOM G6 TRANSMITTER) MISC 1 Device by Does not apply route as directed.   dapagliflozin propanediol (FARXIGA) 5 MG TABS tablet Take 1 tablet (5 mg total) by mouth daily.   diphenoxylate-atropine (LOMOTIL) 2.5-0.025 MG tablet TAKE 2 TABLETS BY MOUTH 4 (FOUR) TIMES DAILY AS NEEDED FOR DIARRHEA OR LOOSE STOOLS.   dronabinol (MARINOL) 5 MG capsule Take 1 capsule (5 mg total) by mouth 2 (two) times daily before lunch and supper.   fentaNYL (DURAGESIC) 50 MCG/HR Place 1 patch onto the skin every 3 (three) days.   glucose blood (ONETOUCH ULTRA) test strip USE TWICE DAILY AS DIRECTED ( E11.65)   hyoscyamine (LEVSIN SL) 0.125 MG SL tablet Place 1 tablet (0.125 mg total) under the tongue every 4 (four) hours as needed.   insulin aspart (NOVOLOG FLEXPEN) 100 UNIT/ML FlexPen Inject 6 Units into the skin 3 (three) times daily with meals.   Insulin Glargine (BASAGLAR KWIKPEN) 100 UNIT/ML Inject 20 Units into the skin daily. (Patient taking differently: Inject 22 Units into the skin daily.)   Insulin Pen Needle 32G X 4 MM MISC 1 Device by Does not apply route in the morning, at noon, in the evening, and at bedtime.   Lidocaine, Anorectal, 5 % GEL Use bid prn   lidocaine-prilocaine (EMLA) cream APPLY TO AFFECTED AREA  ONCE AS DIRECTED   lipase/protease/amylase (CREON) 36000 UNITS CPEP capsule Take 2 capsules (72,000 Units total) by mouth 3 (three) times daily with meals. May also take 1 capsule (36,000 Units total) as needed (with snacks).   loperamide (IMODIUM) 2 MG capsule TAKE 2 AT ONSET OF DIARRHEA, THEN 1 EVERY 2 HOURS UNTIL 12 HOURS WITHOUT A BOWEL MOEVENT. MAY TAKE 2 TAB EVERY 4 HOURS AT BEDTIME. IF DIARRHEA RECURS REPEAT.   LORazepam (ATIVAN) 0.5 MG tablet Take 1 tablet (0.5 mg total) by mouth every 6 (six) hours as needed (Nausea or vomiting).   metoCLOPramide (REGLAN) 10 MG tablet Take 1 tablet (10 mg total) by mouth 4 (four) times daily -  before meals and at bedtime.   mupirocin ointment (BACTROBAN) 2 % Place 1 application into the nose at bedtime.   nitroGLYCERIN (NITROSTAT) 0.4 MG SL tablet Place 0.4 mg under the tongue every 5 (five) minutes as needed for chest pain.   OLANZapine (ZYPREXA) 10 MG tablet Take 1 tablet (10 mg total)  by mouth at bedtime.   ondansetron (ZOFRAN) 4 MG tablet Take 1 tablet (4 mg total) by mouth every 8 (eight) hours as needed for nausea or vomiting.   pantoprazole (PROTONIX) 40 MG tablet Take 1 tablet (40 mg total) by mouth daily.   pregabalin (LYRICA) 300 MG capsule TAKE 1 CAPSULE BY MOUTH EVERYDAY AT BEDTIME   prochlorperazine (COMPAZINE) 10 MG tablet Take 1 tablet (10 mg total) by mouth every 6 (six) hours as needed (Nausea or vomiting).   rosuvastatin (CRESTOR) 5 MG tablet TAKE 1 TABLET (5 MG TOTAL) BY MOUTH DAILY.   Saw Palmetto, Serenoa repens, (SAW PALMETTO PO) Take 1 capsule by mouth daily as needed (When having prostate problems).   sucralfate (CARAFATE) 1 GM/10ML suspension TAKE 10 MLS (1 G TOTAL) BY MOUTH 4 (FOUR) TIMES DAILY - WITH MEALS AND AT BEDTIME.   SUMAtriptan (IMITREX) 25 MG tablet TAKE 1 TABLET BY MOUTH EVERY 2 HOURS AS NEEDED FOR MIGRAINE.   Wheat Dextrin (BENEFIBER PO) Take 1 Scoop by mouth daily.   No facility-administered encounter medications on  file as of 12/10/2020.     REVIEW OF SYSTEMS  : All other systems reviewed and negative except where noted in the History of Present Illness.   PHYSICAL EXAM: BP (!) 82/42   Pulse 64   Ht 5\' 4"  (1.626 m)   Wt 128 lb (58.1 kg)   SpO2 96%   BMI 21.97 kg/m  General: Chronically ill-appearing white male in no acute distress Head: Normocephalic and atraumatic Eyes:  Sclerae anicteric, conjunctiva pink. Ears: Normal auditory acuity Rectal: No external abnormalities noted.  No definite anal fissure seen or felt.  No definite sign of perirectal abscess noted.  Anoscopy not performed as there was quite a bit of very soft light brown stool in the rectal vault. Musculoskeletal: Symmetrical with no gross deformities  Skin: No lesions on visible extremities Extremities: No edema  Neurological: Alert oriented x 4, grossly non-focal Psychological:  Alert and cooperative. Normal mood and affect  ASSESSMENT AND PLAN: *Rectal pain: No definite fissure seen or felt on exam today.  Anoscopy not performed due to a lot of soft stool in the rectum.  I think due to the degree of pain that he is describing it could certainly be a fissure but I think since he is also immunosuppressed that we need to rule out an abscess as well.  We will try to get an MRI of the pelvis.  We will tentatively plan to treat this as a fissure with diltiazem/lidocaine gel 3 times a day for the next several weeks.  Prescription sent to pharmacy. *Metastatic adenocarcinoma of the pancreas with extensive liver metastasis  CC:  Mosie Lukes, MD

## 2020-12-10 NOTE — Progress Notes (Signed)
I agree with the above note, plan 

## 2020-12-11 ENCOUNTER — Other Ambulatory Visit: Payer: Self-pay

## 2020-12-11 ENCOUNTER — Encounter: Payer: Self-pay | Admitting: Hematology & Oncology

## 2020-12-11 MED ORDER — MORPHINE SULFATE (CONCENTRATE) 20 MG/ML PO SOLN: 10.0000 mg | ORAL | 0 refills | Status: AC | PRN

## 2020-12-12 ENCOUNTER — Ambulatory Visit (HOSPITAL_COMMUNITY)
Admission: RE | Admit: 2020-12-12 | Discharge: 2020-12-12 | Disposition: A | Discharge status: Home / Self Care | Payer: Medicare Other | Source: Ambulatory Visit | Attending: Gastroenterology | Admitting: Gastroenterology

## 2020-12-12 DIAGNOSIS — K6289 Other specified diseases of anus and rectum: Secondary | ICD-10-CM | POA: Insufficient documentation

## 2020-12-12 MED ORDER — GADOBUTROL 1 MMOL/ML IV SOLN
6.0000 mL | Freq: Once | INTRAVENOUS | Status: AC | PRN
  Administered 2020-12-12: 6 mL via INTRAVENOUS

## 2020-12-13 ENCOUNTER — Other Ambulatory Visit: Payer: Self-pay

## 2020-12-13 MED ORDER — CIPROFLOXACIN HCL 500 MG PO TABS: 500.0000 mg | ORAL_TABLET | Freq: Two times a day (BID) | ORAL | 0 refills | Status: AC

## 2020-12-13 MED ORDER — METRONIDAZOLE 500 MG PO TABS: 500.0000 mg | ORAL_TABLET | Freq: Three times a day (TID) | ORAL | 0 refills | Status: DC

## 2020-12-14 ENCOUNTER — Other Ambulatory Visit: Payer: Self-pay | Admitting: Hematology & Oncology

## 2020-12-16 ENCOUNTER — Encounter: Payer: Self-pay | Admitting: Hematology & Oncology

## 2020-12-18 ENCOUNTER — Inpatient Hospital Stay (HOSPITAL_BASED_OUTPATIENT_CLINIC_OR_DEPARTMENT_OTHER): Payer: Medicare Other | Admitting: Family

## 2020-12-18 ENCOUNTER — Encounter: Payer: Self-pay | Admitting: *Deleted

## 2020-12-18 ENCOUNTER — Inpatient Hospital Stay: Payer: Medicare Other

## 2020-12-18 ENCOUNTER — Inpatient Hospital Stay: Payer: Medicare Other | Attending: Hematology & Oncology

## 2020-12-18 ENCOUNTER — Inpatient Hospital Stay: Payer: Medicare Other | Admitting: Genetic Counselor

## 2020-12-18 ENCOUNTER — Encounter: Payer: Self-pay | Admitting: Internal Medicine

## 2020-12-18 ENCOUNTER — Other Ambulatory Visit: Payer: Self-pay

## 2020-12-18 ENCOUNTER — Encounter: Payer: Self-pay | Admitting: Family

## 2020-12-18 VITALS — BP 113/62 | HR 76 | Temp 98.6°F | Resp 20 | Ht 64.0 in | Wt 131.4 lb

## 2020-12-18 VITALS — BP 128/56 | HR 64

## 2020-12-18 DIAGNOSIS — R197 Diarrhea, unspecified: Secondary | ICD-10-CM | POA: Insufficient documentation

## 2020-12-18 DIAGNOSIS — C259 Malignant neoplasm of pancreas, unspecified: Secondary | ICD-10-CM

## 2020-12-18 DIAGNOSIS — E860 Dehydration: Secondary | ICD-10-CM

## 2020-12-18 DIAGNOSIS — Z95828 Presence of other vascular implants and grafts: Secondary | ICD-10-CM

## 2020-12-18 DIAGNOSIS — C787 Secondary malignant neoplasm of liver and intrahepatic bile duct: Secondary | ICD-10-CM | POA: Diagnosis not present

## 2020-12-18 LAB — CMP (CANCER CENTER ONLY)
ALT: 51 U/L — ABNORMAL HIGH (ref 0–44)
AST: 68 U/L — ABNORMAL HIGH (ref 15–41)
Albumin: 3.4 g/dL — ABNORMAL LOW (ref 3.5–5.0)
Alkaline Phosphatase: 482 U/L — ABNORMAL HIGH (ref 38–126)
Anion gap: 7 (ref 5–15)
BUN: 17 mg/dL (ref 6–20)
CO2: 25 mmol/L (ref 22–32)
Calcium: 9.3 mg/dL (ref 8.9–10.3)
Chloride: 105 mmol/L (ref 98–111)
Creatinine: 0.69 mg/dL (ref 0.61–1.24)
GFR, Estimated: >60 mL/min (ref >60)
Glucose, Bld: 162 mg/dL — ABNORMAL HIGH (ref 70–99)
Potassium: 3.9 mmol/L (ref 3.5–5.1)
Sodium: 137 mmol/L (ref 135–145)
Total Bilirubin: 2.5 mg/dL — ABNORMAL HIGH (ref 0.3–1.2)
Total Protein: 7.4 g/dL (ref 6.5–8.1)

## 2020-12-18 LAB — CBC WITH DIFFERENTIAL (CANCER CENTER ONLY)
Abs Immature Granulocytes: 0.04 10*3/uL (ref 0.00–0.07)
Basophils Absolute: 0.1 10*3/uL (ref 0.0–0.1)
Basophils Relative: 1 %
Eosinophils Absolute: 0.2 10*3/uL (ref 0.0–0.5)
Eosinophils Relative: 4 %
HCT: 40.6 % (ref 39.0–52.0)
Hemoglobin: 13.8 g/dL (ref 13.0–17.0)
Immature Granulocytes: 1 %
Lymphocytes Relative: 17 %
Lymphs Abs: 1 10*3/uL (ref 0.7–4.0)
MCH: 32.2 pg (ref 26.0–34.0)
MCHC: 34 g/dL (ref 30.0–36.0)
MCV: 94.6 fL (ref 80.0–100.0)
Monocytes Absolute: 0.6 10*3/uL (ref 0.1–1.0)
Monocytes Relative: 11 %
Neutro Abs: 3.7 10*3/uL (ref 1.7–7.7)
Neutrophils Relative %: 66 %
Platelet Count: 109 10*3/uL — ABNORMAL LOW (ref 150–400)
RBC: 4.29 MIL/uL (ref 4.22–5.81)
RDW: 15.2 % (ref 11.5–15.5)
WBC Count: 5.6 10*3/uL (ref 4.0–10.5)
nRBC: 0 % (ref 0.0–0.2)

## 2020-12-18 LAB — PREALBUMIN: Prealbumin: 9.7 mg/dL — ABNORMAL LOW (ref 18–38)

## 2020-12-18 MED ORDER — METHYLPREDNISOLONE SODIUM SUCC 125 MG IJ SOLR
125.0000 mg | Freq: Once | INTRAMUSCULAR | Status: AC
  Administered 2020-12-18: 10:00:00 125 mg via INTRAVENOUS

## 2020-12-18 MED ORDER — SODIUM CHLORIDE 0.9 % IV SOLN: Freq: Once | INTRAVENOUS | Status: AC

## 2020-12-18 MED ORDER — DIPHENOXYLATE-ATROPINE 2.5-0.025 MG PO TABS: 2.0000 | ORAL_TABLET | Freq: Four times a day (QID) | ORAL | 0 refills | Status: AC | PRN

## 2020-12-18 MED ORDER — METHYLPREDNISOLONE SODIUM SUCC 125 MG IJ SOLR
INTRAMUSCULAR | Status: AC
  Filled 2020-12-18: qty 2

## 2020-12-18 MED ORDER — SODIUM CHLORIDE 0.9% FLUSH
10.0000 mL | Freq: Once | INTRAVENOUS | Status: AC
  Administered 2020-12-18: 12:00:00 10 mL via INTRAVENOUS

## 2020-12-18 MED ORDER — HEPARIN SOD (PORK) LOCK FLUSH 100 UNIT/ML IV SOLN
500.0000 [IU] | Freq: Once | INTRAVENOUS | Status: AC
  Administered 2020-12-18: 12:00:00 500 [IU] via INTRAVENOUS

## 2020-12-18 NOTE — Progress Notes (Signed)
Hematology and Oncology Follow Up Visit  Dean Singh [id number] [birth year] 60 y.o. 12/18/2020   Principle Diagnosis:  Metastatic adenocarcinoma the pancreas - hepatic metastasis   Current Therapy:        FOLFIRINOX-s/p cycle #2 - start on 05/20/2020 --d/c on 07/16/2020 due to toxicity Abraxane/Gemzar -- s/p cycle #3-- start on 07/25/2020 --we will drop day #15 of treatment starting with cycle 3   Interim History:  Dean Singh is here today with his wife for follow-up. He is still having diarrhea and did not pick up his Lomotil prescription.  Reglan exacerbates his diarrhea and imodium has not helped.  GI started him on Flagyl several days ago for rectal pain and possible fissure. He feels that this is helping and will finish his 7 day supply was prescribed.  He states that his pain is managed effectively on his current regimen.  No fever, chills, cough, rash, SOB, chest pain, palpitations, abdominal pain or changes in bladder habits.  He states that this diarrhea is over 5 times day and his episodes of n/v are random occurring with or without having eaten.  No blood loss noted. No abnormal bruising, no petechiae.  He has episodes of dizziness but thankfully no syncope or falls to report.  Neuropathy in his hands and feet is unchanged from baseline.  Despite the n/v/d he has maintained a food appetite and is doing his best to stay well hydrated. His weight is stable at 131 lbs.  He has hypoglycemia (40's) with n/v/d. His wife plans to contact his endocrinologist today to discuss adjusting his insulin dosing.   ECOG Performance Status: 1 - Symptomatic but completely ambulatory  Medications:  Allergies as of 12/18/2020       Reactions   Statins Itching   Atorvastatin cause "my skin to feel horrible and unbearable".   Morphine Nausea And Vomiting   "felt like chest was exploding" pt reports        Medication List        Accurate as of December 18, 2020  9:31 AM. If you have  any questions, ask your nurse or doctor.          STOP taking these medications    hyoscyamine 0.125 MG SL tablet Commonly known as: LEVSIN SL Stopped by: Lottie Dawson, NP   Lidocaine (Anorectal) 5 % Gel Stopped by: Lottie Dawson, NP   lipase/protease/amylase 36000 UNITS Cpep capsule Commonly known as: Creon Stopped by: Lottie Dawson, NP   mupirocin ointment 2 % Commonly known as: BACTROBAN Stopped by: Lottie Dawson, NP   sucralfate 1 GM/10ML suspension Commonly known as: CARAFATE Stopped by: Lottie Dawson, NP       TAKE these medications    AMBULATORY NON FORMULARY MEDICATION Medication Name: Diltiazem 2%/:Lidocaine 5% - Using your index finger, apply a small amount of medication inside the rectum up to your first knuckle/joint three times daily x 6-8 weeks.   aspirin EC 81 MG tablet Take 1 tablet (81 mg total) by mouth daily.   Basaglar KwikPen 100 UNIT/ML Inject 20 Units into the skin daily. What changed: how much to take   BENEFIBER PO Take 1 Scoop by mouth daily.   cilostazol 100 MG tablet Commonly known as: PLETAL Take 100 mg by mouth 2 (two) times daily.   ciprofloxacin 500 MG tablet Commonly known as: CIPRO Take 1 tablet (500 mg total) by mouth 2 (two) times daily for 7 days.   clopidogrel 75 MG tablet Commonly known as: PLAVIX Take  1 tablet (75 mg total) by mouth daily.   dapagliflozin propanediol 5 MG Tabs tablet Commonly known as: Farxiga Take 1 tablet (5 mg total) by mouth daily.   Dexcom G6 Receiver Devi Use as directed E11.65   Dexcom G6 Sensor Misc 1 Device by Does not apply route as directed.   Dexcom G6 Transmitter Misc 1 Device by Does not apply route as directed.   diphenoxylate-atropine 2.5-0.025 MG tablet Commonly known as: LOMOTIL TAKE 2 TABLETS BY MOUTH 4 (FOUR) TIMES DAILY AS NEEDED FOR DIARRHEA OR LOOSE STOOLS.   dronabinol 5 MG capsule Commonly known as: MARINOL Take 1 capsule (5 mg total) by mouth 2 (two) times daily  before lunch and supper.   fentaNYL 50 MCG/HR Commonly known as: [place] 1 patch onto the skin every 3 (three) days.   Insulin Pen Needle 32G X 4 MM Misc 1 Device by Does not apply route in the morning, at noon, in the evening, and at bedtime.   lidocaine-prilocaine cream Commonly known as: EMLA APPLY TO AFFECTED AREA ONCE AS DIRECTED   loperamide 2 MG capsule Commonly known as: IMODIUM TAKE 2 AT ONSET OF DIARRHEA, THEN 1 EVERY 2 HOURS UNTIL 12 HOURS WITHOUT A BOWEL MOEVENT. MAY TAKE 2 TAB EVERY 4 HOURS AT BEDTIME. IF DIARRHEA RECURS REPEAT.   LORazepam 0.5 MG tablet Commonly known as: Ativan Take 1 tablet (0.5 mg total) by mouth every 6 (six) hours as needed (Nausea or vomiting).   metoCLOPramide 10 MG tablet Commonly known as: REGLAN Take 1 tablet (10 mg total) by mouth 4 (four) times daily -  before meals and at bedtime.   metroNIDAZOLE 500 MG tablet Commonly known as: FLAGYL Take 1 tablet (500 mg total) by mouth 3 (three) times daily for 7 days.   morphine 20 MG/ML concentrated solution Commonly known as: ROXANOL Take 0.5 mLs (10 mg total) by mouth every 2 (two) hours as needed for severe pain.   nitroGLYCERIN 0.4 MG SL tablet Commonly known as: NITROSTAT Place 0.4 mg under the tongue every 5 (five) minutes as needed for chest pain.   NovoLOG FlexPen 100 UNIT/ML FlexPen Generic drug: insulin aspart Inject 6 Units into the skin 3 (three) times daily with meals.   OLANZapine 10 MG tablet Commonly known as: ZYPREXA TAKE 1 TABLET BY MOUTH EVERYDAY AT BEDTIME   ondansetron 4 MG tablet Commonly known as: Zofran Take 1 tablet (4 mg total) by mouth every 8 (eight) hours as needed for nausea or vomiting.   OneTouch Ultra test strip Generic drug: glucose blood USE TWICE DAILY AS DIRECTED ( E11.65)   pantoprazole 40 MG tablet Commonly known as: PROTONIX Take 1 tablet (40 mg total) by mouth daily.   pregabalin 300 MG capsule Commonly known as: LYRICA TAKE 1  CAPSULE BY MOUTH EVERYDAY AT BEDTIME   prochlorperazine 10 MG tablet Commonly known as: COMPAZINE Take 1 tablet (10 mg total) by mouth every 6 (six) hours as needed (Nausea or vomiting).   rosuvastatin 5 MG tablet Commonly known as: CRESTOR TAKE 1 TABLET (5 MG TOTAL) BY MOUTH DAILY.   SAW PALMETTO PO Take 1 capsule by mouth daily as needed (When having prostate problems).   SUMAtriptan 25 MG tablet Commonly known as: IMITREX TAKE 1 TABLET BY MOUTH EVERY 2 HOURS AS NEEDED FOR MIGRAINE.        Allergies:  Allergies  Allergen Reactions   Statins Itching    Atorvastatin cause "my skin to feel horrible and unbearable".  Morphine Nausea And Vomiting    "felt like chest was exploding" pt reports     Past Medical History, Surgical history, Social history, and Family History were reviewed and updated.  Review of Systems: All other 10 point review of systems is negative.   Physical Exam:  height is 5\' 4"  (1.626 m) and weight is 131 lb 6.4 oz (59.6 kg). His oral temperature is 98.6 F (37 C). His blood pressure is 113/62 and his pulse is 76. His respiration is 20 and oxygen saturation is 98%.   Wt Readings from Last 3 Encounters:  12/18/20 131 lb 6.4 oz (59.6 kg)  12/10/20 128 lb (58.1 kg)  12/04/20 130 lb (59 kg)    Ocular: Sclerae unicteric, pupils equal, round and reactive to light Ear-nose-throat: Oropharynx clear, dentition fair Lymphatic: No cervical or supraclavicular adenopathy Lungs no rales or rhonchi, good excursion bilaterally Heart regular rate and rhythm, no murmur appreciated Abd soft, nontender, positive bowel sounds MSK no focal spinal tenderness, no joint edema Neuro: non-focal, well-oriented, appropriate affect Breasts: Deferred   Lab Results  Component Value Date   WBC 5.6 12/18/2020   HGB 13.8 12/18/2020   HCT 40.6 12/18/2020   MCV 94.6 12/18/2020   PLT 109 (L) 12/18/2020   Lab Results  Component Value Date   FERRITIN 255 10/24/2020   IRON  88 10/24/2020   TIBC 310 10/24/2020   UIBC 222 10/24/2020   IRONPCTSAT 28 10/24/2020   Lab Results  Component Value Date   RBC 4.29 12/18/2020   No results found for: KPAFRELGTCHN, LAMBDASER, KAPLAMBRATIO No results found for: IGGSERUM, IGA, IGMSERUM No results found for: Odetta Pink, SPEI   Chemistry      Component Value Date/Time   NA 135 12/04/2020 1328   NA 132 (L) 05/01/2019 1108   K 4.0 12/04/2020 1328   CL 102 12/04/2020 1328   CO2 26 12/04/2020 1328   BUN 15 12/04/2020 1328   BUN 18 05/01/2019 1108   CREATININE 0.66 12/04/2020 1328      Component Value Date/Time   CALCIUM 9.5 12/04/2020 1328   ALKPHOS 579 (H) 12/04/2020 1328   AST 64 (H) 12/04/2020 1328   ALT 51 (H) 12/04/2020 1328   BILITOT 1.6 (H) 12/04/2020 1328       Impression and Plan: Dean Singh is a very pleasant 60 yo caucasian gentleman with metastatic adenocarcinoma of the pancrease with extensive liver metastasis.  Imodium and Reglan discontinued.  He will start taking Lomotil as prescribed.  He will also try Ativan sublingual for n/v.  No treatment today per MD. Fluids and steroids only.  CA 19-9 last month was 4,275.  Follow-up in 3 weeks.  We can certainly see him sooner if needed.   Lottie Dawson, NP 12/14/20229:31 AM

## 2020-12-18 NOTE — Patient Instructions (Signed)

## 2020-12-20 ENCOUNTER — Telehealth: Payer: Self-pay | Admitting: *Deleted

## 2020-12-20 ENCOUNTER — Encounter: Payer: Self-pay | Admitting: Hematology & Oncology

## 2020-12-20 LAB — CANCER ANTIGEN 19-9: CA 19-9: 5333 U/mL — ABNORMAL HIGH (ref 0–35)

## 2020-12-20 MED ORDER — AMBULATORY NON FORMULARY MEDICATION: 1 refills | Status: AC

## 2020-12-20 NOTE — Telephone Encounter (Signed)
Per 12/20/20 los - gave upcoming appointments - confirmed

## 2020-12-20 NOTE — Progress Notes (Signed)
Oncology Nurse Navigator Documentation  Oncology Nurse Navigator Flowsheets 12/18/2020  Abnormal Finding Date -  Confirmed Diagnosis Date -  Diagnosis Status -  Planned Course of Treatment -  Phase of Treatment -  Chemotherapy Actual Start Date: -  Navigator Follow Up Date: 01/10/2021  Navigator Follow Up Reason: Follow-up Appointment;Chemotherapy  Navigator Location [facility]  Referral Date to RadOnc/MedOnc -  Navigator Encounter Type Follow-up Appt;Appt/Treatment Plan Review  Telephone -  Treatment Initiated Date -  Patient Visit Type MedOnc  Treatment Phase Active Tx  Barriers/Navigation Needs Coordination of Care;Education  Education -  Interventions Psycho-Social Support  Acuity Level 2-Minimal Needs (1-2 Barriers Identified)  Coordination of Care -  Education Method -  Support Groups/Services Friends and Family  Time Spent with Patient 15

## 2020-12-23 ENCOUNTER — Encounter: Payer: Self-pay | Admitting: *Deleted

## 2020-12-23 ENCOUNTER — Other Ambulatory Visit: Payer: Self-pay | Admitting: *Deleted

## 2020-12-23 MED ORDER — CHOLESTYRAMINE 4 G PO PACK: 4.0000 g | PACK | Freq: Three times a day (TID) | ORAL | 12 refills | Status: AC

## 2020-12-24 ENCOUNTER — Other Ambulatory Visit: Payer: Self-pay | Admitting: Hematology & Oncology

## 2020-12-24 DIAGNOSIS — C787 Secondary malignant neoplasm of liver and intrahepatic bile duct: Secondary | ICD-10-CM

## 2020-12-26 ENCOUNTER — Ambulatory Visit: Payer: Medicaid Other | Admitting: Physician Assistant

## 2020-12-31 ENCOUNTER — Other Ambulatory Visit: Payer: Self-pay | Admitting: Hematology & Oncology

## 2021-01-01 ENCOUNTER — Encounter: Payer: Self-pay | Admitting: Hematology & Oncology

## 2021-01-01 ENCOUNTER — Inpatient Hospital Stay (HOSPITAL_BASED_OUTPATIENT_CLINIC_OR_DEPARTMENT_OTHER): Payer: Medicare Other | Admitting: Genetic Counselor

## 2021-01-01 ENCOUNTER — Encounter: Payer: Self-pay | Admitting: Genetic Counselor

## 2021-01-01 DIAGNOSIS — C787 Secondary malignant neoplasm of liver and intrahepatic bile duct: Secondary | ICD-10-CM | POA: Diagnosis not present

## 2021-01-01 DIAGNOSIS — C259 Malignant neoplasm of pancreas, unspecified: Secondary | ICD-10-CM

## 2021-01-01 DIAGNOSIS — C679 Malignant neoplasm of bladder, unspecified: Secondary | ICD-10-CM

## 2021-01-01 DIAGNOSIS — Z1379 Encounter for other screening for genetic and chromosomal anomalies: Secondary | ICD-10-CM | POA: Diagnosis not present

## 2021-01-01 NOTE — Progress Notes (Signed)
HPI: [name] was previously seen in the [facility] due to a family of cancer and concerns regarding a hereditary predisposition to cancer. Please refer to our prior cancer genetics clinic note for more information regarding [name] medical, social and family histories, and our assessment and recommendations, at the time. [name] recent genetic test results were disclosed to her, as were recommendations warranted by these results. These results and recommendations are discussed in more detail below.   FAMILY HISTORY:  We obtained a detailed, 4-generation family history.  Significant diagnoses are listed below: Family History  Problem Relation Age of Onset   COPD Mother    Diabetes Mother    Dementia Mother    Heart disease Mother        chf   Arthritis Mother    Osteoporosis Mother        h/o cigarette smoking   Heart disease Father        stents 3 x 2, pacer, chf   Parkinson's disease Father    Hyperlipidemia Father    Diabetes Father    Arthritis Father        DDD   Osteoporosis Father        h/o cigarette use   Addison's disease Sister    Diabetes Sister    Hyperlipidemia Sister    Hypertension Sister    Obesity Sister    Hyperlipidemia Brother    Hypertension Brother    Diabetes Brother    Obesity Brother    Obesity Brother    Diabetes Maternal Grandmother    Dementia Maternal Grandmother    Heart disease Maternal Grandmother        chf   Hyperlipidemia Maternal Grandmother    Diabetes Maternal Grandfather    Dementia Maternal Grandfather    Heart disease Maternal Grandfather        cad   Hyperlipidemia Maternal Grandfather    Other Paternal Grandmother        scarlet fever   Prostate cancer Paternal Grandfather    Rheumatic fever Son    Stickler syndrome Son    Esophageal cancer Neg Hx    Colon cancer Neg Hx    Pancreatic cancer Neg Hx    Stomach cancer Neg Hx     The patient has two sons who are cancer free.  He has two  brothers and a sister who are cancer free. His mother is living and his father is deceased.  The patient's mother is 53.  She has three sisters and a brother.  One sister had breast cancer and the brother had lung cancer.  The maternal grandparents died of complications from dementia.  The patient's father died of lung cancer.  He had many brothers and sisters and the patient is not aware of their health.  The paternal grandparents are deceased.  The grandfather had prostate cancer.  Patient's maternal ancestors are of Caucasian descent, and paternal ancestors are of Caucasian descent. There is no reported Ashkenazi Jewish ancestry. There is no known consanguinity.  GENETIC TEST RESULTS: At the time of [name] visit to his oncologist, we recommended he pursue genetic testing of the CancerNext-Expanded+RNAinsight. This test, which included sequencing and deletion/duplication analysis of the genes listed on the test report, was performed at [facility]. [name] was called today with his genetic test results. 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  Since [name] has only one pathogenic mutation in  MUTYH, he is NOT affected with MYH-associated polyposis, but instead is a carrier. A copy of the test report has been scanned into Epic and is located under the Molecular Pathology section of the Results Review tab.     SCREENING RECOMMENDATIONS: We discussed that this result does not explain his personal or family history of cancer.  Based on his genetic testing, we do not know why he has cancer or why there is a family history of cancer.  This means that we have not identified a hereditary cause for his personal and family history of cancer at this time. Most cancers happen by chance and this negative test suggests that his cancer may fall into this category.    While reassuring, this does not definitively rule out a hereditary  predisposition to cancer. It is still possible that there could be genetic mutations that are undetectable by current technology. There could be genetic mutations in genes that have not been tested or identified to increase cancer risk.  Therefore, it is recommended he continue to follow the cancer management and screening guidelines provided by his oncology and primary healthcare provider.   An individual's cancer risk and medical management are not determined by genetic test results alone. Overall cancer risk assessment incorporates additional factors, including personal medical history, family history, and any available genetic information that may result in a personalized plan for cancer prevention and surveillance.  We discussed the implications of a heterozygous MUTYH mutation for [name], and discussed who else in the family should have genetic testing. We recommended [name] follow the most updated medical management guidelines (NCCN Guidelines v1.2023) for heterozygous MUTYH mutations; all of which are outlined below. These can be coordinated by [name] GI doctor or her primary provider.   Personal history of colon cancer  Follow instructions provided by your physician based on your personal history.  Do not have a personal history of colon cancer but have a parent/sibling/child with colon cancer: Colonoscopy every 5 years starting at age 54 or 48 years younger than the earliest age of onset, whichever is younger.  Do not have a personal history of colon cancer but do not have a parent/sibling/child with colon cancer: Data is uncertain on whether specialized screening is warranted.[name]  FAMILY MEMBERS: Since we now know the mutation in [name], we can test at-risk relatives to determine whether or not they have inherited the mutation and are at increased risk for cancer.   [name] children and siblings have a 50% chance to have inherited this mutation. We recommend they have genetic  testing for mutations within this gene, as identifying the presence of this mutation, or additional mutations, would allow them to also take advantage of risk-reducing measures. Genetic testing can be performed by [facility] within 90 days of Mr. Schleifer test report and it would be complementary.  There could be a charge for the appointment to be seen.  We will be happy to meet with any of the family members or refer them to a genetic counselor in their local area. To locate genetic counselors in other cities, individuals can visit the website of the [employer] (ArtistMovie.se) and Secretary/administrator for a Social worker by zip code.   We strongly encouraged Mr. Karnes and his family to remain in contact with [place] in cancer genetics on an annual basis so we can update Mr. Frisbee personal and family histories, and inform him of advances in cancer genetics that may be of  benefit for the entire family. Mr. Hashemi knows he is also welcome to call with any questions or concerns, at any time.   Roma Kayser, Velma, [facility]  Licensed, Certified Genetic Counselor [email or web address] phone: [phone]  The patient was seen for a total of 30 minutes in face-to-face genetic counseling.

## 2021-01-02 ENCOUNTER — Other Ambulatory Visit: Payer: Self-pay | Admitting: *Deleted

## 2021-01-02 ENCOUNTER — Telehealth: Payer: Self-pay | Admitting: *Deleted

## 2021-01-02 DIAGNOSIS — C679 Malignant neoplasm of bladder, unspecified: Secondary | ICD-10-CM

## 2021-01-02 DIAGNOSIS — C787 Secondary malignant neoplasm of liver and intrahepatic bile duct: Secondary | ICD-10-CM

## 2021-01-02 NOTE — Telephone Encounter (Signed)
Reviewed pt wife email with MD. Referral to hospice entered. Called Missy( wife) discussed Dr. Marin Olp is referring pt to hospice. Missy agreed she knew this was coming.   Referral entered and call placed to [facility]. Pt records faxed to [phone].

## 2021-01-10 ENCOUNTER — Telehealth: Payer: Self-pay | Admitting: *Deleted

## 2021-01-10 ENCOUNTER — Inpatient Hospital Stay: Payer: Medicare Other

## 2021-01-10 ENCOUNTER — Encounter: Payer: Self-pay | Admitting: *Deleted

## 2021-01-10 ENCOUNTER — Inpatient Hospital Stay: Payer: Medicare Other | Admitting: Family

## 2021-01-10 NOTE — Progress Notes (Signed)
Review of chart for today's appointment, however patient has been referred to and enrolled in hospice. Will discontinue active navigation.   Oncology Nurse Navigator Documentation  Oncology Nurse Navigator Flowsheets 01/10/2021  Abnormal Finding Date -  Confirmed Diagnosis Date -  Diagnosis Status -  Planned Course of Treatment -  Phase of Treatment -  Chemotherapy Actual Start Date: -  Navigator Follow Up Date: -  Navigator Follow Up Reason: -  Navigation Complete Date: 01/10/2021  Post Navigation: Continue to Follow Patient? No  Reason Not Navigating Patient: Hospice/Death  Navigator Location [facility]  Referral Date to RadOnc/MedOnc -  Navigator Encounter Type Appt/Treatment Plan Review  Telephone -  Treatment Initiated Date -  Patient Visit Type MedOnc  Treatment Phase Other  Barriers/Navigation Needs -  Education -  Interventions None Required  Acuity Level 2-Minimal Needs (1-2 Barriers Identified)  Coordination of Care -  Education Method -  Support Groups/Services Friends and Family  Time Spent with Patient 15

## 2021-01-10 NOTE — Telephone Encounter (Signed)
Call to [facility] for pt status update. Pt status is stable. Pt unable to come for additional appts. Pt appts cancelled.

## 2021-01-16 ENCOUNTER — Encounter: Payer: Self-pay | Admitting: Hematology & Oncology

## 2021-01-16 ENCOUNTER — Other Ambulatory Visit: Payer: Self-pay | Admitting: *Deleted

## 2021-01-16 ENCOUNTER — Other Ambulatory Visit: Payer: Self-pay | Admitting: Hematology & Oncology

## 2021-01-16 MED ORDER — HYDROCORT-PRAMOXINE (PERIANAL) 1-1 % EX FOAM: 1.0000 | Freq: Three times a day (TID) | CUTANEOUS | 2 refills | Status: DC | PRN

## 2021-01-17 ENCOUNTER — Other Ambulatory Visit: Payer: Self-pay | Admitting: Hematology & Oncology

## 2021-01-17 ENCOUNTER — Encounter: Payer: Self-pay | Admitting: Hematology & Oncology

## 2021-01-23 ENCOUNTER — Encounter: Payer: Self-pay | Admitting: Hematology & Oncology

## 2021-01-23 NOTE — Telephone Encounter (Signed)
Opened in error

## 2021-02-10 ENCOUNTER — Encounter: Payer: Self-pay | Admitting: Hematology & Oncology

## 2021-02-18 ENCOUNTER — Encounter: Payer: Self-pay | Admitting: Hematology & Oncology

## 2021-02-21 ENCOUNTER — Ambulatory Visit: Payer: Medicare Other | Admitting: Internal Medicine

## 2021-03-05 DEATH — deceased

## 2022-11-02 IMAGING — CT CT CHEST-ABD-PELV W/ CM
2 of 5 series · 13 of 36 positions shown, 15 images · IV contrast (Omnipaque)
Comparison: Chest CT 04/29/2020.  Abdomen/pelvis CT 04/15/2020.

CLINICAL DATA: Pancreatic cancer.  Restaging.

EXAM:
CT CHEST, ABDOMEN, AND PELVIS WITH CONTRAST
TECHNIQUE: Multidetector CT imaging of the chest, abdomen and pelvis was
performed following the standard protocol during bolus
administration of intravenous contrast.
CONTRAST:  75mL OMNIPAQUE IOHEXOL 300 MG/ML  SOLN

[Series 2: cap with 2 · axial · 0.76mm/px · z∈[-630,-76]mm · 10 of 137 slices shown, 12 images]
[im 13/137  mediastinal]
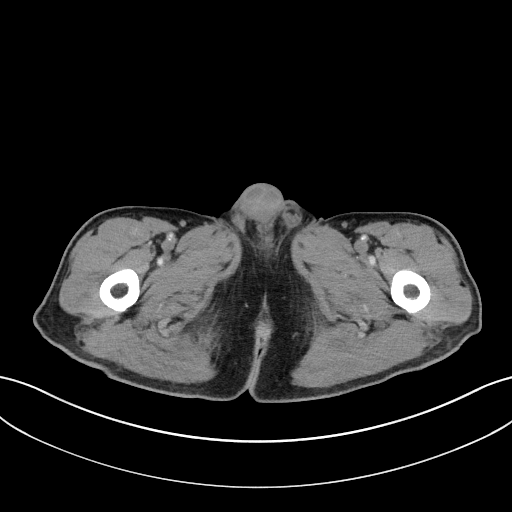
[im 13/137  bone]
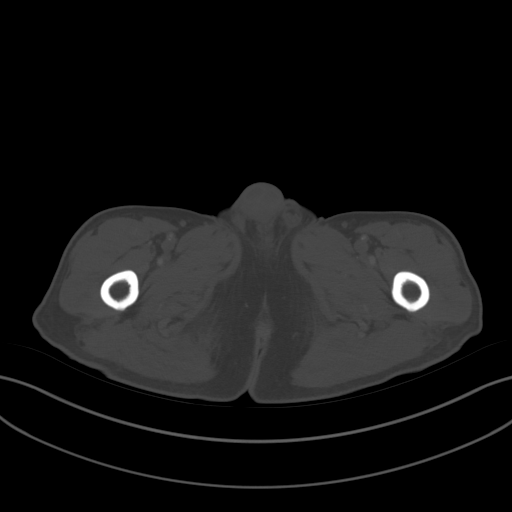
[im 25/137  mediastinal]
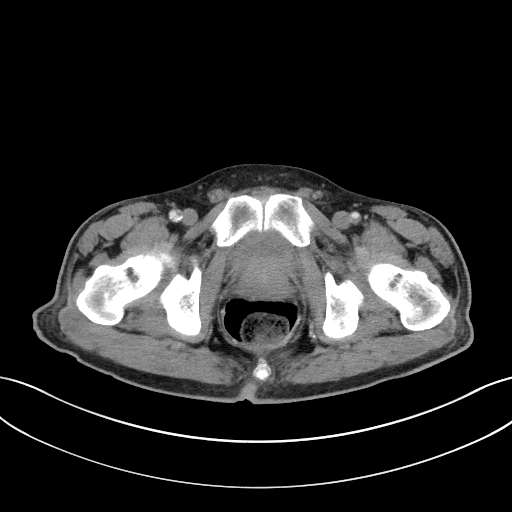
[im 38/137  mediastinal]
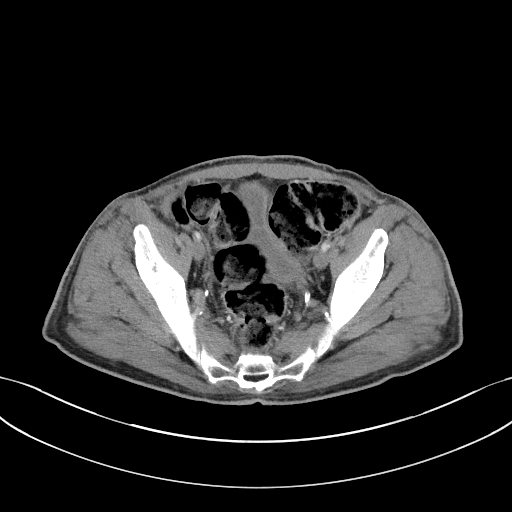
[im 50/137  mediastinal]
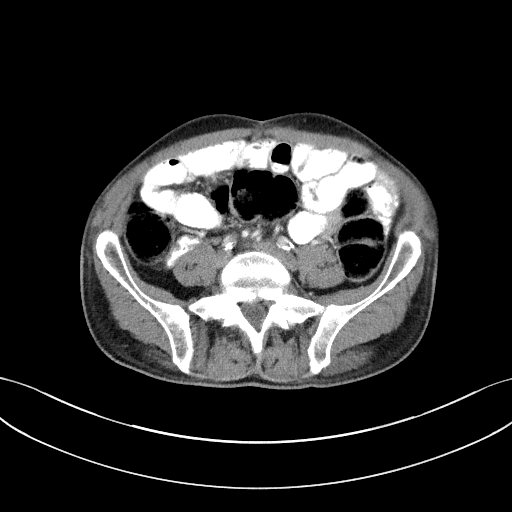
[im 62/137  mediastinal]
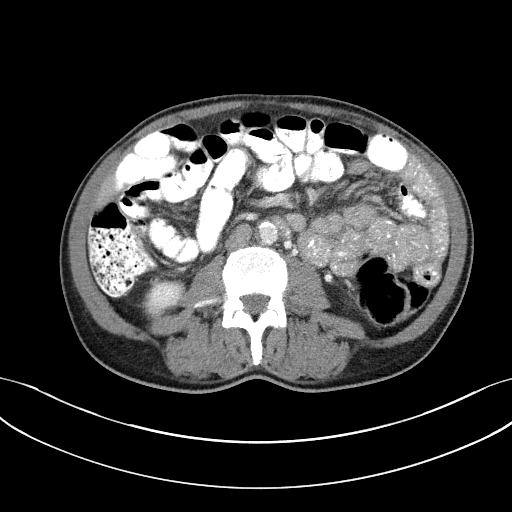
[im 75/137  mediastinal]
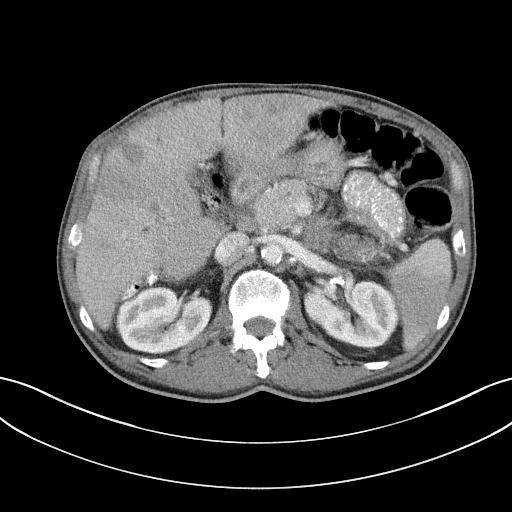
[im 87/137  mediastinal]
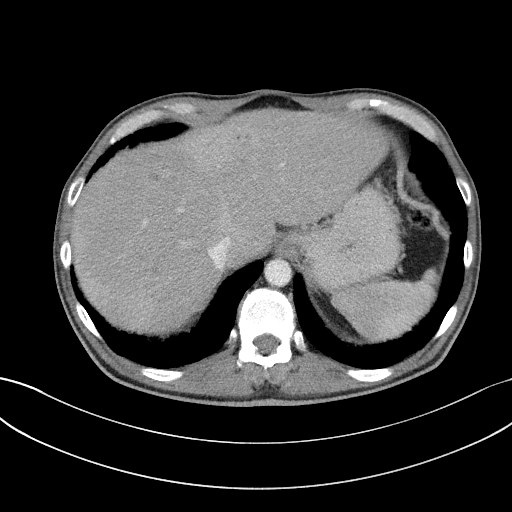
[im 99/137  mediastinal]
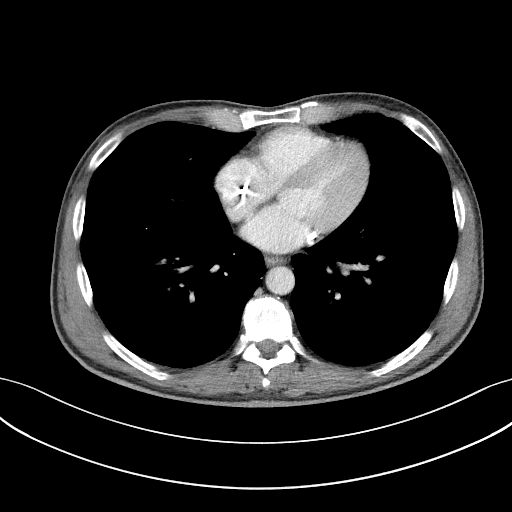
[im 112/137  mediastinal]
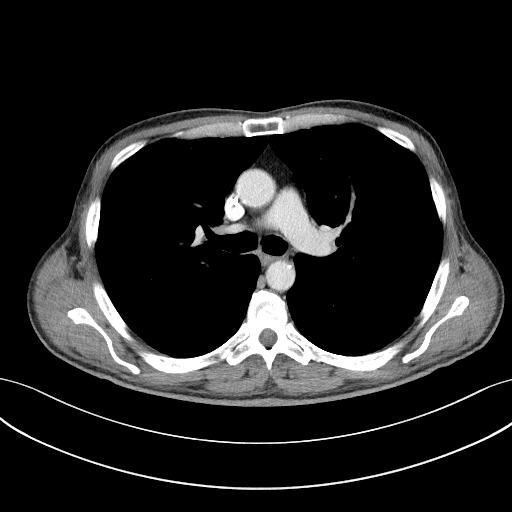
[im 112/137  bone]
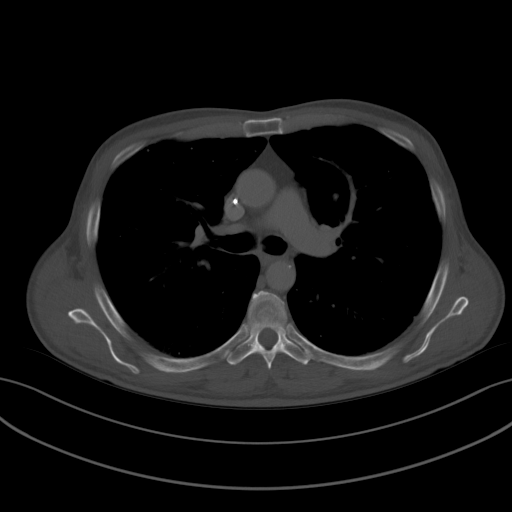
[im 124/137  mediastinal]
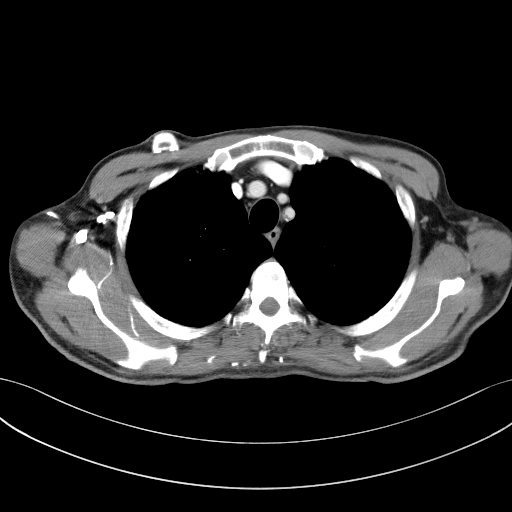

[Series 5: coronals · coronal · 0.72mm/px · 3 of 129 slices shown]
[im 26/129  mediastinal]
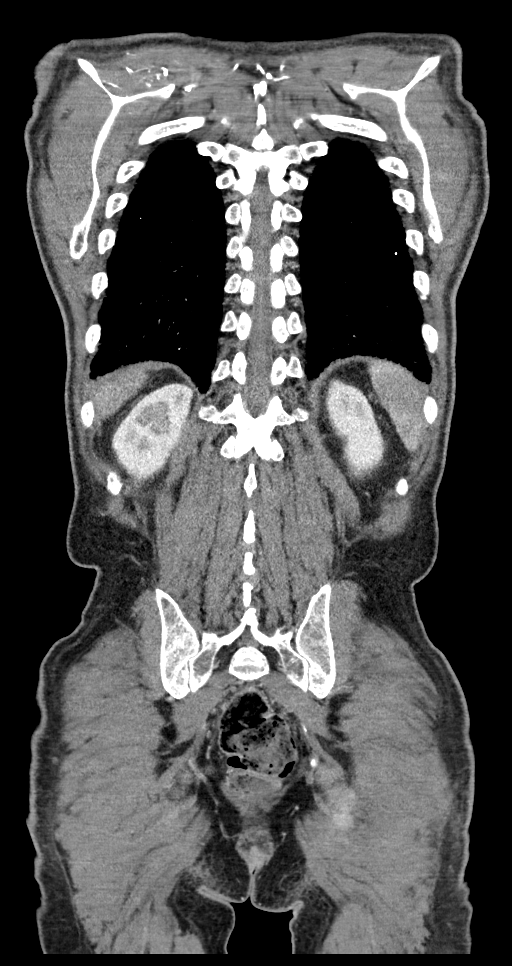
[im 52/129  mediastinal]
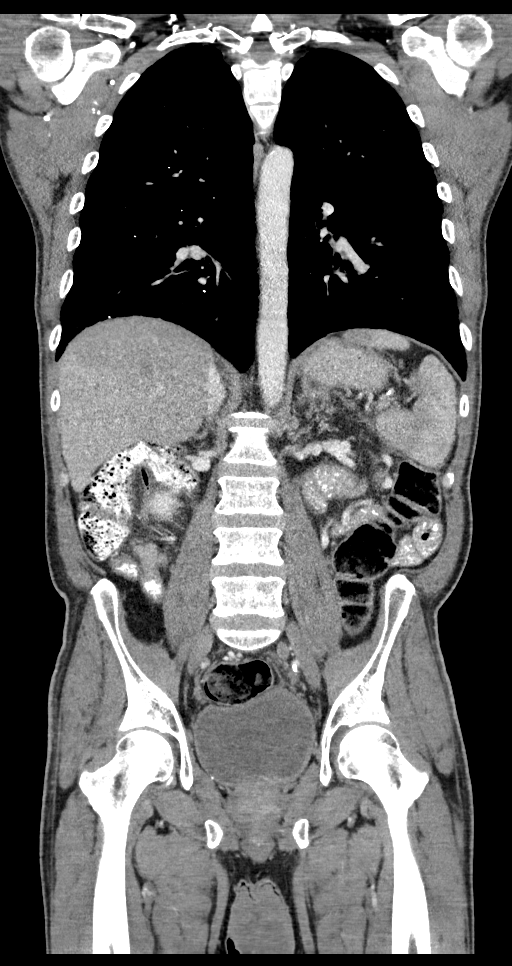
[im 77/129  mediastinal]
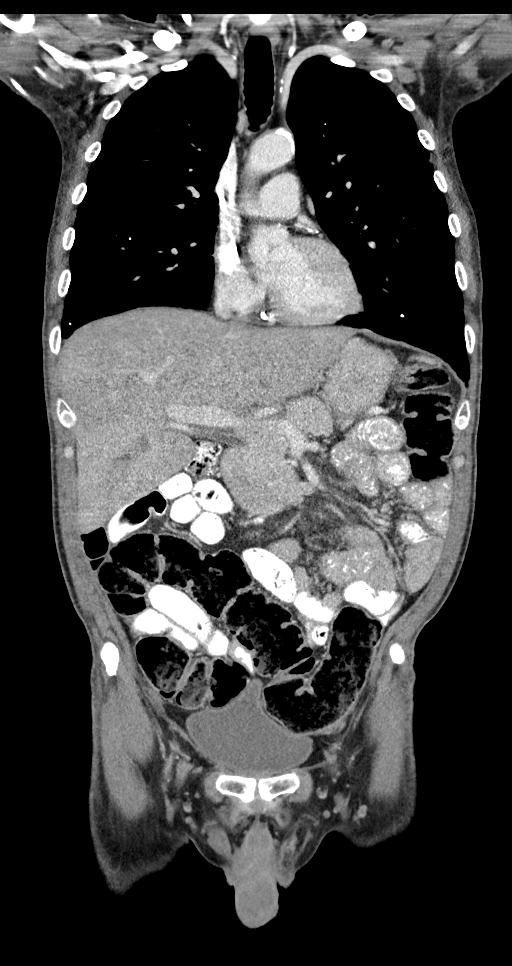

[13 of 36 positions shown; findings below may reference images not displayed]

FINDINGS: CT CHEST FINDINGS

Cardiovascular: The heart size is normal. No substantial pericardial
effusion. Mild atherosclerotic calcification is noted in the wall of
the thoracic aorta. Coronary artery calcification is evident. Right
Port-A-Cath tip is positioned in the right atrium.

Mediastinum/Nodes: No mediastinal lymphadenopathy. There is no hilar
lymphadenopathy. The esophagus has normal imaging features. There is
no axillary lymphadenopathy.

Lungs/Pleura: Centrilobular and paraseptal emphysema evident.
Scattered calcified granulomata are noted in the lungs bilaterally.
No overtly suspicious nodule or mass. No focal airspace
consolidation. No pleural effusion.

Musculoskeletal: No worrisome lytic or sclerotic osseous
abnormality.

CT ABDOMEN PELVIS FINDINGS

Hepatobiliary: Multiple ill-defined hypoattenuating liver lesions
are compatible with metastatic involvement.

Lateral segment left liver lesion measuring 1.2 x 1.0 cm on 54/2
today was 1.8 x 1.7 cm previously.

1.8 x 1.5 cm anterior right liver lesion on 63/2 was 2.4 x 2.1 cm
previously.

1.2 x 0.7 cm posterior right hepatic lesion on 52/2 was 1.5 x 1.0 cm
(remeasured) previously.

There is no evidence for gallstones, gallbladder wall thickening, or
pericholecystic fluid. No intrahepatic or extrahepatic biliary
dilation.

Pancreas: Ill-defined hypoenhancing lesion in the tail of pancreas
measures 5.0 x 3.4 cm today compared to 5.1 x 3.3 cm previously, not
substantially changed. As before, lesion occludes the splenic vein
and abuts the SMA and portal splenic confluence.

Spleen: Heterogeneous perfusion of splenic parenchyma evident.

Adrenals/Urinary Tract: No adrenal nodule or mass. Vascular
calcification noted in the hilum of both kidneys with possible
punctate nonobstructing stone in the posterior lower interpolar left
kidney. No evidence for hydroureter. The urinary bladder appears
normal for the degree of distention.

Stomach/Bowel: Stomach is unremarkable. No gastric wall thickening.
No evidence of outlet obstruction. Duodenum is normally positioned
as is the ligament of Treitz. No small bowel wall thickening. No
small bowel dilatation. The terminal ileum is normal. The appendix
is normal. Large stool volume throughout the length of the colon.

Vascular/Lymphatic: There is moderate atherosclerotic calcification
of the abdominal aorta without aneurysm. There is no gastrohepatic
or hepatoduodenal ligament lymphadenopathy. No retroperitoneal or
mesenteric lymphadenopathy. No pelvic sidewall lymphadenopathy.

Reproductive: The prostate gland and seminal vesicles are
unremarkable.

Other: No intraperitoneal free fluid.

Musculoskeletal: No worrisome lytic or sclerotic osseous
abnormality.
IMPRESSION: 1. No substantial interval change in size of the ill-defined
hypoenhancing lesion in the tail of pancreas. Occlusion of the
splenic vein again noted and lesion abuts the SMA and portal splenic
confluence.
2. Interval mild decrease in size of multiple ill-defined
hypoattenuating liver lesions compatible with metastatic
involvement.
3. No evidence for metastatic disease in the chest.
4. Large stool volume throughout the length of the colon. Imaging
features could be compatible with constipation in the appropriate
clinical setting.
5. Aortic Atherosclerosis (4DYWN-M6H.H) and Emphysema (4DYWN-SXB.G).
